# Patient Record
Sex: Female | Born: 1965
Health system: Southern US, Community
[De-identification: ages and names within clinical notes are randomized; demographics above are authoritative.]

## PROBLEM LIST (undated history)

## (undated) DIAGNOSIS — R42 Dizziness and giddiness: Secondary | ICD-10-CM

## (undated) DIAGNOSIS — R51 Headache: Secondary | ICD-10-CM

## (undated) DIAGNOSIS — K219 Gastro-esophageal reflux disease without esophagitis: Secondary | ICD-10-CM

## (undated) DIAGNOSIS — R519 Headache, unspecified: Secondary | ICD-10-CM

## (undated) DIAGNOSIS — A048 Other specified bacterial intestinal infections: Secondary | ICD-10-CM

## (undated) DIAGNOSIS — Z227 Latent tuberculosis: Secondary | ICD-10-CM

## (undated) DIAGNOSIS — I052 Rheumatic mitral stenosis with insufficiency: Secondary | ICD-10-CM

## (undated) DIAGNOSIS — R200 Anesthesia of skin: Secondary | ICD-10-CM

## (undated) DIAGNOSIS — I4891 Unspecified atrial fibrillation: Secondary | ICD-10-CM

## (undated) DIAGNOSIS — I10 Essential (primary) hypertension: Secondary | ICD-10-CM

## (undated) HISTORY — DX: Rheumatic mitral stenosis with insufficiency: I05.2

## (undated) HISTORY — DX: Other specified bacterial intestinal infections: A04.8

## (undated) HISTORY — DX: Essential (primary) hypertension: I10

## (undated) HISTORY — PX: DILATION AND CURETTAGE OF UTERUS: SHX78

## (undated) HISTORY — DX: Unspecified atrial fibrillation: I48.91

## (undated) HISTORY — DX: Latent tuberculosis: Z22.7

## (undated) HISTORY — DX: Gastro-esophageal reflux disease without esophagitis: K21.9

## (undated) HISTORY — DX: Anesthesia of skin: R20.0

---

## 2007-03-02 DIAGNOSIS — Z227 Latent tuberculosis: Secondary | ICD-10-CM | POA: Insufficient documentation

## 2007-03-02 HISTORY — DX: Latent tuberculosis: Z22.7

## 2009-11-27 ENCOUNTER — Ambulatory Visit: Payer: Self-pay | Admitting: Internal Medicine

## 2009-12-19 ENCOUNTER — Ambulatory Visit (HOSPITAL_COMMUNITY): Admission: RE | Admit: 2009-12-19 | Discharge: 2009-12-19 | Payer: Self-pay | Admitting: Family Medicine

## 2010-02-01 ENCOUNTER — Observation Stay (HOSPITAL_COMMUNITY)
Admission: EM | Admit: 2010-02-01 | Discharge: 2010-02-02 | Payer: Self-pay | Source: Home / Self Care | Admitting: Emergency Medicine

## 2010-05-12 LAB — POCT I-STAT, CHEM 8
Chloride: 106 mEq/L (ref 96–112)
Creatinine, Ser: 0.8 mg/dL (ref 0.4–1.2)
Hemoglobin: 13.9 g/dL (ref 12.0–15.0)
Potassium: 3.9 mEq/L (ref 3.5–5.1)
Sodium: 143 mEq/L (ref 135–145)

## 2011-10-15 ENCOUNTER — Other Ambulatory Visit: Payer: Self-pay | Admitting: Family Medicine

## 2011-10-15 ENCOUNTER — Ambulatory Visit (HOSPITAL_COMMUNITY)
Admission: RE | Admit: 2011-10-15 | Discharge: 2011-10-15 | Disposition: A | Discharge status: Home / Self Care | Payer: Self-pay | Source: Ambulatory Visit | Attending: Family Medicine | Admitting: Family Medicine

## 2011-10-15 DIAGNOSIS — R1084 Generalized abdominal pain: Secondary | ICD-10-CM

## 2011-10-15 DIAGNOSIS — R109 Unspecified abdominal pain: Secondary | ICD-10-CM | POA: Insufficient documentation

## 2012-02-09 ENCOUNTER — Inpatient Hospital Stay (HOSPITAL_COMMUNITY)
Admission: EM | Admit: 2012-02-09 | Discharge: 2012-02-15 | DRG: 149 | Disposition: A | Discharge status: Home / Self Care | Payer: MEDICAID | Attending: Internal Medicine | Admitting: Internal Medicine

## 2012-02-09 ENCOUNTER — Encounter (HOSPITAL_COMMUNITY): Payer: Self-pay | Admitting: *Deleted

## 2012-02-09 ENCOUNTER — Other Ambulatory Visit: Payer: Self-pay

## 2012-02-09 ENCOUNTER — Inpatient Hospital Stay (HOSPITAL_COMMUNITY): Payer: Self-pay

## 2012-02-09 ENCOUNTER — Emergency Department (HOSPITAL_COMMUNITY): Payer: Self-pay

## 2012-02-09 DIAGNOSIS — S92302A Fracture of unspecified metatarsal bone(s), left foot, initial encounter for closed fracture: Secondary | ICD-10-CM

## 2012-02-09 DIAGNOSIS — S92309A Fracture of unspecified metatarsal bone(s), unspecified foot, initial encounter for closed fracture: Secondary | ICD-10-CM | POA: Diagnosis present

## 2012-02-09 DIAGNOSIS — W06XXXA Fall from bed, initial encounter: Secondary | ICD-10-CM | POA: Diagnosis present

## 2012-02-09 DIAGNOSIS — S52123A Displaced fracture of head of unspecified radius, initial encounter for closed fracture: Secondary | ICD-10-CM | POA: Diagnosis present

## 2012-02-09 DIAGNOSIS — R4182 Altered mental status, unspecified: Secondary | ICD-10-CM

## 2012-02-09 DIAGNOSIS — R42 Dizziness and giddiness: Principal | ICD-10-CM | POA: Diagnosis present

## 2012-02-09 DIAGNOSIS — W19XXXA Unspecified fall, initial encounter: Secondary | ICD-10-CM

## 2012-02-09 DIAGNOSIS — R51 Headache: Secondary | ICD-10-CM | POA: Diagnosis present

## 2012-02-09 DIAGNOSIS — N390 Urinary tract infection, site not specified: Secondary | ICD-10-CM | POA: Diagnosis present

## 2012-02-09 DIAGNOSIS — Y998 Other external cause status: Secondary | ICD-10-CM

## 2012-02-09 DIAGNOSIS — R531 Weakness: Secondary | ICD-10-CM | POA: Diagnosis present

## 2012-02-09 DIAGNOSIS — S6290XA Unspecified fracture of unspecified wrist and hand, initial encounter for closed fracture: Secondary | ICD-10-CM

## 2012-02-09 DIAGNOSIS — Y92009 Unspecified place in unspecified non-institutional (private) residence as the place of occurrence of the external cause: Secondary | ICD-10-CM

## 2012-02-09 HISTORY — DX: Headache: R51

## 2012-02-09 HISTORY — DX: Dizziness and giddiness: R42

## 2012-02-09 HISTORY — DX: Headache, unspecified: R51.9

## 2012-02-09 LAB — BASIC METABOLIC PANEL
BUN: 11 mg/dL (ref 6–23)
CO2: 25 mEq/L (ref 19–32)
Calcium: 9.6 mg/dL (ref 8.4–10.5)
Chloride: 106 mEq/L (ref 96–112)
Creatinine, Ser: 0.69 mg/dL (ref 0.50–1.10)
GFR calc Af Amer: >90 mL/min (ref >90)
GFR calc non Af Amer: >90 mL/min (ref >90)
Glucose, Bld: 106 mg/dL — ABNORMAL HIGH (ref 70–99)
Potassium: 4 mEq/L (ref 3.5–5.1)
Sodium: 141 mEq/L (ref 135–145)

## 2012-02-09 LAB — URINE MICROSCOPIC-ADD ON

## 2012-02-09 LAB — CBC WITH DIFFERENTIAL/PLATELET
Basophils Absolute: 0 10*3/uL (ref 0.0–0.1)
Basophils Relative: 0 % (ref 0–1)
Eosinophils Absolute: 0.2 10*3/uL (ref 0.0–0.7)
Eosinophils Relative: 2 % (ref 0–5)
HCT: 39.4 % (ref 36.0–46.0)
Hemoglobin: 13.5 g/dL (ref 12.0–15.0)
Lymphocytes Relative: 35 % (ref 12–46)
Lymphs Abs: 3.1 10*3/uL (ref 0.7–4.0)
MCH: 29 pg (ref 26.0–34.0)
MCHC: 34.3 g/dL (ref 30.0–36.0)
MCV: 84.7 fL (ref 78.0–100.0)
Monocytes Absolute: 0.5 10*3/uL (ref 0.1–1.0)
Monocytes Relative: 6 % (ref 3–12)
Neutro Abs: 5 10*3/uL (ref 1.7–7.7)
Neutrophils Relative %: 57 % (ref 43–77)
Platelets: 213 10*3/uL (ref 150–400)
RBC: 4.65 MIL/uL (ref 3.87–5.11)
RDW: 12.5 % (ref 11.5–15.5)
WBC: 8.9 10*3/uL (ref 4.0–10.5)

## 2012-02-09 LAB — URINALYSIS, ROUTINE W REFLEX MICROSCOPIC
Bilirubin Urine: NEGATIVE
Glucose, UA: NEGATIVE mg/dL
Ketones, ur: NEGATIVE mg/dL
Nitrite: NEGATIVE
Protein, ur: NEGATIVE mg/dL
Specific Gravity, Urine: 1.027 (ref 1.005–1.030)
Urobilinogen, UA: 1 mg/dL (ref 0.0–1.0)
pH: 5.5 (ref 5.0–8.0)

## 2012-02-09 LAB — TROPONIN I: Troponin I: <0.3 ng/mL (ref <0.30)

## 2012-02-09 MED ORDER — KETOROLAC TROMETHAMINE 15 MG/ML IJ SOLN
15.0000 mg | Freq: Once | INTRAMUSCULAR | Status: AC
  Administered 2012-02-09: 15 mg via INTRAVENOUS
  Filled 2012-02-09 (×2): qty 1

## 2012-02-09 MED ORDER — SODIUM CHLORIDE 0.9 % IJ SOLN
3.0000 mL | Freq: Two times a day (BID) | INTRAMUSCULAR | Status: DC
  Administered 2012-02-10 – 2012-02-12 (×2): 3 mL via INTRAVENOUS

## 2012-02-09 MED ORDER — ONDANSETRON HCL 4 MG/2ML IJ SOLN
4.0000 mg | Freq: Four times a day (QID) | INTRAMUSCULAR | Status: DC | PRN
  Filled 2012-02-09: qty 2

## 2012-02-09 MED ORDER — ONDANSETRON HCL 4 MG PO TABS: 4.0000 mg | ORAL_TABLET | Freq: Four times a day (QID) | ORAL | Status: DC | PRN

## 2012-02-09 MED ORDER — SODIUM CHLORIDE 0.9 % IV SOLN: 250.0000 mL | INTRAVENOUS | Status: DC | PRN

## 2012-02-09 MED ORDER — ASPIRIN EC 81 MG PO TBEC
81.0000 mg | DELAYED_RELEASE_TABLET | Freq: Every day | ORAL | Status: DC
  Administered 2012-02-10 – 2012-02-15 (×6): 81 mg via ORAL
  Filled 2012-02-09 (×6): qty 1

## 2012-02-09 MED ORDER — SODIUM CHLORIDE 0.9 % IJ SOLN
3.0000 mL | INTRAMUSCULAR | Status: DC | PRN
  Administered 2012-02-10: 3 mL via INTRAVENOUS

## 2012-02-09 MED ORDER — SODIUM CHLORIDE 0.9 % IV SOLN
INTRAVENOUS | Status: DC
  Administered 2012-02-10 – 2012-02-11 (×2): via INTRAVENOUS

## 2012-02-09 MED ORDER — DEXTROSE 5 % IV SOLN
1.0000 g | Freq: Every day | INTRAVENOUS | Status: DC
  Administered 2012-02-10 (×2): 1 g via INTRAVENOUS
  Filled 2012-02-09 (×3): qty 10

## 2012-02-09 MED ORDER — SODIUM CHLORIDE 0.9 % IV BOLUS (SEPSIS)
1000.0000 mL | Freq: Once | INTRAVENOUS | Status: AC
  Administered 2012-02-09: 1000 mL via INTRAVENOUS

## 2012-02-09 MED ORDER — HEPARIN SODIUM (PORCINE) 5000 UNIT/ML IJ SOLN
5000.0000 [IU] | Freq: Three times a day (TID) | INTRAMUSCULAR | Status: DC
  Administered 2012-02-10 – 2012-02-15 (×15): 5000 [IU] via SUBCUTANEOUS
  Filled 2012-02-09 (×20): qty 1

## 2012-02-09 MED ORDER — MORPHINE SULFATE 4 MG/ML IJ SOLN
4.0000 mg | Freq: Once | INTRAMUSCULAR | Status: AC
  Administered 2012-02-09: 4 mg via INTRAVENOUS
  Filled 2012-02-09: qty 1

## 2012-02-09 MED ORDER — HYDROMORPHONE HCL PF 1 MG/ML IJ SOLN: 1.0000 mg | INTRAMUSCULAR | Status: DC | PRN

## 2012-02-09 MED ORDER — ONDANSETRON 4 MG PO TBDP
4.0000 mg | ORAL_TABLET | Freq: Once | ORAL | Status: AC
  Administered 2012-02-09: 4 mg via ORAL
  Filled 2012-02-09: qty 1

## 2012-02-09 MED ORDER — SODIUM CHLORIDE 0.9 % IJ SOLN: 3.0000 mL | Freq: Two times a day (BID) | INTRAMUSCULAR | Status: DC
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
## 2012-02-09 NOTE — ED Notes (Signed)
No answer for vital signs in waiting room.

## 2012-02-09 NOTE — ED Notes (Signed)
Pt complaining of left chest pain after fall

## 2012-02-09 NOTE — ED Notes (Signed)
Pt with lower abdominal heaviness for one month

## 2012-02-09 NOTE — ED Provider Notes (Signed)
History     CSN: [id number]  Arrival date & time 02/09/12  1300   First MD Initiated Contact with Patient 02/09/12 1608      Chief Complaint  Patient presents with  . Fall  . Dizziness  . Arm Injury  . Foot Pain    (Consider location/radiation/quality/duration/timing/severity/associated sxs/prior treatment) The history is provided by the patient. The history is limited by a language barrier. A language interpreter was used.   Abigail Butler 46 y.o. female     46 year old female presents to the emergency department with chief complaint of fall and pain.  History is limited by a language barrier and interpreter phone is used.The patient speaks a [place] dialect of arabic and translation has been difficult through certified language interpreters; although sudanese arabic interpreter was obtained. The patient has several episodes of dizziness and falling over the past 6 months.  She fell twice over the past 2 days.  It is unclear if her injuries occurred yesterday or today, however she has pain in her left foot and significant pain in her left elbow forearm and wrist.  The patient apparently fell while getting out of bed.  She states that she was lying down and when she stood up she felt very dizzy and weak and fell to the floor.  She denies vertiginous symptoms unilateral weakness visual disturbance, facial asymmetry or difficulty with speech; however the interpreter states that the patient seems to have difficulty understanding the questions and was unable to get a clear answer.The patient denies any significant comorbidities or chronic medical conditions.  She denies hitting her head or loss of consciousness.  She denies any recent nausea vomiting or diarrhea.  Patient states she has intermittent palpitations with chest pain.  It is unclear if this is associated with her falling episodes.   History reviewed. No pertinent past medical history.  History reviewed. No pertinent past  surgical history.  No family history on file.  History  Substance Use Topics  . Smoking status: Never Smoker   . Smokeless tobacco: Not on file  . Alcohol Use: No    OB History    Grav Para Term Preterm Abortions TAB SAB Ect Mult Living                  Review of Systems  Unable to perform ROS: Other    Allergies  Review of patient's allergies indicates no known allergies.  Home Medications  No current outpatient prescriptions on file.  BP 126/79  Pulse 82  Temp 98.5 F (36.9 C) (Oral)  Resp 18  SpO2 98%  Physical Exam  Nursing note and vitals reviewed. Constitutional: She is oriented to person, place, and time. She appears well-developed and well-nourished. She appears distressed.  HENT:  Head: Normocephalic and atraumatic.  Eyes: Conjunctivae normal and EOM are normal. Pupils are equal, round, and reactive to light.  Neck: Normal range of motion. Neck supple. No JVD present.  Cardiovascular: Normal rate, regular rhythm, normal heart sounds and intact distal pulses.  Exam reveals no friction rub.   No murmur heard. Pulmonary/Chest: Effort normal and breath sounds normal. No respiratory distress.  Abdominal: Soft. Bowel sounds are normal. She exhibits no distension.  Musculoskeletal:       Right elbow exquisitly tender to palpation.  There is swelling into ulnar side of the forearm there is swelling of wrist.  She is neurovascularly intact.  Patient also has swelling and deformity of the left foot. neurovascularly intact tender to palpation.  Neurological: She is alert and oriented to person, place, and time. No cranial nerve deficit.  Skin: Skin is warm and dry. No rash noted. She is not diaphoretic.    ED Course  Procedures (including critical care time)  Labs Reviewed  BASIC METABOLIC PANEL - Abnormal; Notable for the following:    Glucose, Bld 106 (*)     All other components within normal limits  URINALYSIS, ROUTINE W REFLEX MICROSCOPIC - Abnormal;  Notable for the following:    APPearance HAZY (*)     Hgb urine dipstick TRACE (*)     Leukocytes, UA MODERATE (*)     All other components within normal limits  URINE MICROSCOPIC-ADD ON - Abnormal; Notable for the following:    Squamous Epithelial / LPF MANY (*)     Bacteria, UA FEW (*)     All other components within normal limits  TROPONIN I  CBC WITH DIFFERENTIAL  URINE CULTURE   Dg Chest 2 View  02/09/2012  *RADIOLOGY REPORT*  Clinical Data: Chest pain radiating to posterior right chest, dizziness, fell 2 days ago  CHEST - 2 VIEW  Comparison: 02/01/2010  Findings: Upper-normal size of cardiac silhouette. Elongation thoracic aorta. Mediastinal contours and pulmonary vascularity otherwise normal. Minimal right base atelectasis. Lungs otherwise clear. No pleural effusion or pneumothorax. Bones unremarkable.  IMPRESSION: Minimal right basilar atelectasis   Original Report Authenticated By: Ulyses Southward, M.D.    Dg Ankle Complete Left  02/09/2012  *RADIOLOGY REPORT*  Clinical Data: Pain and swelling at lateral malleolus, fell 2 days ago injuring left ankle  LEFT ANKLE COMPLETE - 3+ VIEW  Comparison: None  Findings: Bones appear demineralized. Soft tissue swelling laterally extending anteriorly. Ankle mortise intact. Mildly distracted fracture identified at base of the fifth metatarsal. No additional fracture, dislocation or bone destruction. Accessory ossicle at lateral margin of cuboid.  IMPRESSION: Osseous demineralization. Distracted fracture at base of fifth metatarsal. Lateral ankle soft tissue swelling without additional acute bony abnormalities.   Original Report Authenticated By: Ulyses Southward, M.D.    Dg Humerus Left  02/09/2012  *RADIOLOGY REPORT*  Clinical Data: Left humeral pain post fall  LEFT HUMERUS - 2+ VIEW  Comparison: None  Findings: Single lateral view of the left humerus with additional scapular Y view of the proximal left humerus and shoulder. Patient unable to position for AP  humerus image.  Osseous demineralization. No gross fracture, dislocation or bone destruction identified on limited exam.  IMPRESSION: No definite acute bony abnormalities on limited exam as above.   Original Report Authenticated By: Ulyses Southward, M.D.     Date: 02/10/2012  Rate: 82  Rhythm: normal sinus rhythm  QRS Axis: normal  Intervals: normal  ST/T Wave abnormalities: nonspecific t wave abnormalities  Conduction Disutrbances: none  Narrative Interpretation:   Old EKG Reviewed: n/a    1. Fall at home   2. Dizziness   3. Fracture of metatarsal bone of left foot   4. Radial head fracture       MDM   Filed Vitals:   02/09/12 1311 02/09/12 1605  BP: 134/69 126/79  Pulse: 88 82  Temp: 98.5 F (36.9 C)   TempSrc: Oral   Resp: 20 18  SpO2: 98% 98%   Patient wit dizziness and repeated falls significant enough to lead to multiple fractures. Social issues, lack of health care access  and financial barriers make in-patient work-up essential. The patient has no acute ecg abnormalities and neuro exam is otherwise normal although mental  status US difficult to assess due to language barriers. I have spoke with Dr. HO who agrees to admit the patient for further assessment.        Arthor Captain, PA-C 02/10/12 1208

## 2012-02-09 NOTE — ED Notes (Signed)
Patient transported to X-ray 

## 2012-02-09 NOTE — Progress Notes (Signed)
Orthopedic Tech Progress Note Patient Details:  Abigail Butler [birth year] [id number]  Ortho Devices Type of Ortho Device: Ace wrap;Arm sling;Post (short) splint;Sugartong splint Splint Material: Fiberglass Ortho Device/Splint Location: (L) U-L E Ortho Device/Splint Interventions: Application   Jennye Moccasin 02/09/2012, 8:24 PM

## 2012-02-09 NOTE — ED Notes (Signed)
Ortho tech made aware of need for splints

## 2012-02-09 NOTE — ED Notes (Signed)
PT here with dizziness and fell two days ago and hurt left foot.  Then patient got dizzy again and fell this am and complaining of pain to left upper distal arm near elbow ( can move fingers but not bend)

## 2012-02-10 ENCOUNTER — Inpatient Hospital Stay (HOSPITAL_COMMUNITY): Payer: MEDICAID

## 2012-02-10 ENCOUNTER — Encounter (HOSPITAL_COMMUNITY): Payer: Self-pay | Admitting: Internal Medicine

## 2012-02-10 DIAGNOSIS — R5383 Other fatigue: Secondary | ICD-10-CM

## 2012-02-10 DIAGNOSIS — R0989 Other specified symptoms and signs involving the circulatory and respiratory systems: Secondary | ICD-10-CM

## 2012-02-10 DIAGNOSIS — S5290XA Unspecified fracture of unspecified forearm, initial encounter for closed fracture: Secondary | ICD-10-CM

## 2012-02-10 DIAGNOSIS — Y92009 Unspecified place in unspecified non-institutional (private) residence as the place of occurrence of the external cause: Secondary | ICD-10-CM

## 2012-02-10 DIAGNOSIS — R4182 Altered mental status, unspecified: Secondary | ICD-10-CM

## 2012-02-10 DIAGNOSIS — R42 Dizziness and giddiness: Secondary | ICD-10-CM | POA: Diagnosis present

## 2012-02-10 DIAGNOSIS — S6290XA Unspecified fracture of unspecified wrist and hand, initial encounter for closed fracture: Secondary | ICD-10-CM | POA: Diagnosis present

## 2012-02-10 DIAGNOSIS — S62309A Unspecified fracture of unspecified metacarpal bone, initial encounter for closed fracture: Secondary | ICD-10-CM

## 2012-02-10 DIAGNOSIS — R531 Weakness: Secondary | ICD-10-CM | POA: Diagnosis present

## 2012-02-10 LAB — LIPID PANEL
HDL: 28 mg/dL — ABNORMAL LOW (ref >39)
LDL Cholesterol: 95 mg/dL (ref 0–99)
Total CHOL/HDL Ratio: 5.3 RATIO
Triglycerides: 118 mg/dL (ref <150)

## 2012-02-10 LAB — HEMOGLOBIN A1C: Hgb A1c MFr Bld: 6 % — ABNORMAL HIGH (ref <5.7)

## 2012-02-10 LAB — URINE CULTURE
Colony Count: NO GROWTH
Culture: NO GROWTH

## 2012-02-10 LAB — FOLATE: Folate: 16.8 ng/mL

## 2012-02-10 LAB — VITAMIN B12: Vitamin B-12: 596 pg/mL (ref 211–911)

## 2012-02-10 MED ORDER — HYDROMORPHONE HCL PF 1 MG/ML IJ SOLN
0.5000 mg | INTRAMUSCULAR | Status: DC | PRN
  Administered 2012-02-10: 0.5 mg via INTRAVENOUS
  Filled 2012-02-10 (×2): qty 1

## 2012-02-10 NOTE — H&P (Signed)
IM Attending  59 woman from [place], here 3-4 years but no [facility].  Ptresents with recurring spells of dizzyness with serious falling over 7 years (beginning in [place]).  Pattern is not clearly worsening but is persistent.  This week she has fallen hard twice and has fractures of radius and a metatarsal!  The spells have sense of head motion.  No LOC or post-ictal deficits.  No known hx of head injury, infection or epilepsy.  Often has severe headache during spells. CT w/o contrast negative.  Labs unhelpful:  bmet, cbc, troponins normal.  EKG with T-wave flattening infero-lateral.  Impression:  This is a serious falling disorder with multiple fractures.  Chronicity makes infection or neoplasm unlikely.  Atypical seizures, migraine, inner ear disease,  or (less likely) TIAs possible.  Language and financial barriers make in-patient work-up essential.  I would begin with a Neuro consult so that we do not perform unnecessary or inadequate studies.
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
## 2012-02-10 NOTE — ED Provider Notes (Signed)
Medical screening examination/treatment/procedure(s) were performed by non-physician practitioner and as supervising physician I was immediately available for consultation/collaboration.  Raeford Razor, MD 02/10/12 1452

## 2012-02-10 NOTE — Progress Notes (Signed)
VASCULAR LAB PRELIMINARY  PRELIMINARY  PRELIMINARY  PRELIMINARY  Carotid Dopplers completed.    Preliminary report:  There is no  ICA stenosis.  Vertebral artery flow is antegrade.  Abigail Butler, RVT 02/10/2012, 11:01 AM

## 2012-02-11 ENCOUNTER — Inpatient Hospital Stay (HOSPITAL_COMMUNITY): Payer: Self-pay

## 2012-02-11 DIAGNOSIS — R4182 Altered mental status, unspecified: Secondary | ICD-10-CM | POA: Diagnosis present

## 2012-02-11 LAB — URINALYSIS, ROUTINE W REFLEX MICROSCOPIC
Bilirubin Urine: NEGATIVE
Glucose, UA: NEGATIVE mg/dL
Hgb urine dipstick: NEGATIVE
Ketones, ur: NEGATIVE mg/dL
Protein, ur: NEGATIVE mg/dL
pH: 6 (ref 5.0–8.0)

## 2012-02-11 LAB — CBC
MCH: 27.9 pg (ref 26.0–34.0)
MCV: 84.8 fL (ref 78.0–100.0)
Platelets: 204 10*3/uL (ref 150–400)
RDW: 12.6 % (ref 11.5–15.5)
WBC: 8.1 10*3/uL (ref 4.0–10.5)

## 2012-02-11 LAB — BASIC METABOLIC PANEL
CO2: 24 mEq/L (ref 19–32)
Calcium: 8.6 mg/dL (ref 8.4–10.5)
Creatinine, Ser: 0.7 mg/dL (ref 0.50–1.10)
GFR calc non Af Amer: >90 mL/min (ref >90)
Glucose, Bld: 105 mg/dL — ABNORMAL HIGH (ref 70–99)
Sodium: 140 mEq/L (ref 135–145)

## 2012-02-11 LAB — CK TOTAL AND CKMB (NOT AT ARMC): Total CK: 85 U/L (ref 7–177)

## 2012-02-11 MED ORDER — IBUPROFEN 400 MG PO TABS
400.0000 mg | ORAL_TABLET | Freq: Four times a day (QID) | ORAL | Status: DC | PRN
  Administered 2012-02-11 – 2012-02-12 (×2): 400 mg via ORAL
  Filled 2012-02-11 (×3): qty 1

## 2012-02-11 MED ORDER — ACETAMINOPHEN 325 MG PO TABS
650.0000 mg | ORAL_TABLET | ORAL | Status: DC | PRN
  Administered 2012-02-11 – 2012-02-14 (×3): 650 mg via ORAL
  Filled 2012-02-11 (×3): qty 2

## 2012-02-11 MED ORDER — CIPROFLOXACIN HCL 250 MG PO TABS
250.0000 mg | ORAL_TABLET | Freq: Two times a day (BID) | ORAL | Status: AC
  Administered 2012-02-11 – 2012-02-12 (×3): 250 mg via ORAL
  Filled 2012-02-11 (×3): qty 1

## 2012-02-11 NOTE — Progress Notes (Signed)
EEG completed.

## 2012-02-12 ENCOUNTER — Inpatient Hospital Stay (HOSPITAL_COMMUNITY): Payer: MEDICAID

## 2012-02-12 LAB — BASIC METABOLIC PANEL
BUN: 10 mg/dL (ref 6–23)
Chloride: 106 mEq/L (ref 96–112)
Glucose, Bld: 111 mg/dL — ABNORMAL HIGH (ref 70–99)
Potassium: 4 mEq/L (ref 3.5–5.1)

## 2012-02-12 LAB — CBC
HCT: 37.4 % (ref 36.0–46.0)
Hemoglobin: 12.4 g/dL (ref 12.0–15.0)
MCH: 28.1 pg (ref 26.0–34.0)
MCHC: 33.2 g/dL (ref 30.0–36.0)
MCV: 84.6 fL (ref 78.0–100.0)

## 2012-02-12 MED ORDER — IOHEXOL 350 MG/ML SOLN
50.0000 mL | Freq: Once | INTRAVENOUS | Status: AC | PRN
  Administered 2012-02-12: 50 mL via INTRAVENOUS

## 2012-02-12 NOTE — Procedures (Signed)
EEG NUMBER:  [id number]  REFERRING PHYSICIAN:  Noel Christmas, MD  INDICATION FOR STUDY:  A 46 year old lady with a history of dizziness and unsteady gait as well as episodes of transient mental status changes.  Study is being performed to rule out possible seizure disorder.  This is a routine EEG recording performed during wakefulness. Predominant background activity consisted of low amplitude 2-3 Hz diffuse symmetrical low-amplitude delta activity with superimposed 15-20 Hz beta activity diffusely.  Occasional brief runs of 8 Hz alpha rhythm were recorded from the posterior head regions.  Photic stimulation was not performed.  Hyperventilation was also not performed.  No epileptiform discharges were recorded.  There were no areas of disproportionate focal slowing.  INTERPRETATION:  This EEG showed minimal generalized continuous slowing of cerebral activity, which was nonspecific.  This pattern of slowing can be seen with metabolic as well as degenerative hypoxic encephalopathies.  No evidence of an epileptic disorder was demonstrated.     Noel Christmas, MD    [id number] D:  02/11/2012 12:29:51  T:  02/12/2012 00:20:01  Job #:  [id number]

## 2012-02-12 NOTE — Progress Notes (Signed)
Orthopedic Tech Progress Note Patient Details:  Abigail Butler 10/16/1965 [id number]  Ortho Devices Type of Ortho Device: Postop shoe/boot Splint Material: Fiberglass Ortho Device/Splint Location: LEFT POST OP SHOE  Ortho Device/Splint Interventions: Application   [name], [name] 02/12/2012, 3:52 PM
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
## 2012-02-12 NOTE — Progress Notes (Signed)
Subjective: Pt no acute events overnight. Still with some suprapubic discomfort. No dizziness at this time.   Objective: Vital signs in last 24 hours: Filed Vitals:   02/11/12 1100 02/11/12 1435 02/11/12 2142 02/12/12 0630  BP:  116/67 141/84 115/46  Pulse:  69 70 72  Temp:  98.4 F (36.9 C) 96.8 F (36 C) 98.4 F (36.9 C)  TempSrc:  Oral Oral Oral  Resp:  18 18 18   Height: 5\' 4"  (1.626 m)     Weight: 180 lb (81.647 kg)     SpO2:  95% 95% 96%   Weight change:   Intake/Output Summary (Last 24 hours) at 02/12/12 0844 Last data filed at 02/11/12 1639  Gross per 24 hour  Intake    600 ml  Output    500 ml  Net    100 ml   General: resting in bed, NAD HEENT: PERRL, EOMI, no scleral icterus Cardiac: RRR, no rubs, murmurs or gallops Pulm: clear to auscultation bilaterally, moving normal volumes of air Abd: soft, nontender, nondistended, BS present Ext: warm and well perfused, no pedal edema Neuro: alert and oriented X3, L arm and L leg in casts, able to move all limbs spontaneously with equal strength 5/5, cranial nerves II-XII grossly intact  Lab Results: Basic Metabolic Panel:  Lab 02/12/12 4540 02/11/12 0600  NA 140 140  K 4.0 3.7  CL 106 107  CO2 24 24  GLUCOSE 111* 105*  BUN 10 9  CREATININE 0.71 0.70  CALCIUM 9.0 8.6  MG -- --  PHOS -- --   CBC:  Lab 02/12/12 0622 02/11/12 0600 02/09/12 1336  WBC 7.4 8.1 --  NEUTROABS -- -- 5.0  HGB 12.4 11.9* --  HCT 37.4 36.2 --  MCV 84.6 84.8 --  PLT 203 204 --   Urinalysis:  Lab 02/11/12 1250 02/09/12 1616  COLORURINE YELLOW YELLOW  LABSPEC 1.011 1.027  PHURINE 6.0 5.5  GLUCOSEU NEGATIVE NEGATIVE  HGBUR NEGATIVE TRACE*  BILIRUBINUR NEGATIVE NEGATIVE  KETONESUR NEGATIVE NEGATIVE  PROTEINUR NEGATIVE NEGATIVE  UROBILINOGEN 0.2 1.0  NITRITE NEGATIVE NEGATIVE  LEUKOCYTESUR NEGATIVE MODERATE*   Micro Results: Recent Results (from the past 240 hour(s))  URINE CULTURE     Status: Normal   Collection Time    02/09/12  4:16 PM      Component Value Range Status Comment   Specimen Description URINE, CLEAN CATCH   Final    Special Requests NONE   Final    Culture  Setup Time 02/09/2012 16:52   Final    Colony Count NO GROWTH   Final    Culture NO GROWTH   Final    Report Status 02/10/2012 FINAL   Final    Studies/Results: Mr [facility] Head Wo Contrast  02/11/2012  *RADIOLOGY REPORT*  Clinical Data:  5-year history of intermittent headaches and vertigo.  Fall.  Episodes of spells of staring.  MRI BRAIN WITHOUT CONTRAST MRA HEAD WITHOUT CONTRAST  Technique: Multiplanar, multiecho pulse sequences of the brain and surrounding structures were obtained according to standard protocol without intravenous contrast.  Angiographic images of the head were obtained using MRA technique without contrast.  Comparison: 02/09/2012 head CT.  No comparison brain MR.  MRI HEAD  Findings:  No acute infarct.  Remote right thalamic infarct.  No intracranial hemorrhage.  No intracranial mass lesion detected on this unenhanced exam.  No hydrocephalus.  Cerebellar tonsils minimally low-lying but within range of normal limits without a pointed appearance.  Partially empty sella.  This may be an incidental finding although also described in patients with pseudotumor cerebri.  No perioptic sheath dilation or flattening of the posterior aspect of the globes as can be seen with pseudotumor.  IMPRESSION: Remote right thalamic infarct.  Please see above.  MRA HEAD  Findings: Anterior circulation without medium or large size vessel significant stenosis or occlusion.  Nonvisualization PICAs.  No significant stenosis of the basilar artery.  Mild branch vessel irregularity.  On source sequences (series 7 images 76 and 77), minimal bulge right internal carotid artery cavernous segment.  This may be related to artifact although a tiny aneurysm is not entirely excluded.  IMPRESSION:  Nonvisualization PICAs.  Mild branch vessel irregularity.  On source  sequences (series 7 images 76 and 77), minimal bulge right internal carotid artery cavernous segment.  This may be related to artifact although a tiny aneurysm is not entirely excluded.   Original Report Authenticated By: Lacy Duverney, M.D.    Mr Brain Wo Contrast  02/11/2012  *RADIOLOGY REPORT*  Clinical Data:  5-year history of intermittent headaches and vertigo.  Fall.  Episodes of spells of staring.  MRI BRAIN WITHOUT CONTRAST MRA HEAD WITHOUT CONTRAST  Technique: Multiplanar, multiecho pulse sequences of the brain and surrounding structures were obtained according to standard protocol without intravenous contrast.  Angiographic images of the head were obtained using MRA technique without contrast.  Comparison: 02/09/2012 head CT.  No comparison brain MR.  MRI HEAD  Findings:  No acute infarct.  Remote right thalamic infarct.  No intracranial hemorrhage.  No intracranial mass lesion detected on this unenhanced exam.  No hydrocephalus.  Cerebellar tonsils minimally low-lying but within range of normal limits without a pointed appearance.  Partially empty sella.  This may be an incidental finding although also described in patients with pseudotumor cerebri.  No perioptic sheath dilation or flattening of the posterior aspect of the globes as can be seen with pseudotumor.  IMPRESSION: Remote right thalamic infarct.  Please see above.  MRA HEAD  Findings: Anterior circulation without medium or large size vessel significant stenosis or occlusion.  Nonvisualization PICAs.  No significant stenosis of the basilar artery.  Mild branch vessel irregularity.  On source sequences (series 7 images 76 and 77), minimal bulge right internal carotid artery cavernous segment.  This may be related to artifact although a tiny aneurysm is not entirely excluded.  IMPRESSION:  Nonvisualization PICAs.  Mild branch vessel irregularity.  On source sequences (series 7 images 76 and 77), minimal bulge right internal carotid artery  cavernous segment.  This may be related to artifact although a tiny aneurysm is not entirely excluded.   Original Report Authenticated By: Lacy Duverney, M.D.    Medications: I have reviewed the patient's current medications. Scheduled Meds:    . aspirin EC  81 mg Oral Daily  . ciprofloxacin  250 mg Oral BID  . heparin  5,000 Units Subcutaneous Q8H  . sodium chloride  3 mL Intravenous Q12H  . sodium chloride  3 mL Intravenous Q12H   Continuous Infusions:  PRN Meds:.sodium chloride, acetaminophen, ibuprofen, ondansetron (ZOFRAN) IV, ondansetron, sodium chloride Assessment/Plan: Ms. Arango 46 yo Arabic speaking [place] refugee immigrated to [place] for past 4-5 yrs presented after significant fall that resulted in fractured Left radius and left metatarsal 2/2 vertigo.  1. UTI: Pt symptomatic pyuria and suprapubic tenderness on admission. Now improving. originally started on IV ceftriaxone. UCX NGTD. -cont oral cipro  2. Vertigo: Chronic spells of vertigo in both lying and  sitting positions for the past 6 yrs. CT was negative for intracranial pathology or acute hemorrhage. EKG with T-wave inversion in inferior-lateral leads but w/o corresponding negative troponins. DDx include vestibular neuritis, Meniere's disease, or BPPV but given severity of symptoms maybe something more serious. 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 EEG showed diffuse mild slowing cerebral activity non-specific but no active seizure disorder. -neurology consult- recs much appreciated  3. Fractures: Plain films reveal left impacted fracture of the proximal radial metaphysis and distracted fracture at base of fifth metatarsal with lateral ankle soft tissue swelling. Orthopedics was consulted and she now has left arm and left foot sling/splint. She is non-weight bearing in the left foot. She was instructed to keep her sling and ACE bandages dry and clean  until she is seen by the Orthopedic surgeon.  -pt will need to be seen by ortho surgery for recs/ follow-up - d/c pt complained dilaudid made dizziness worse -pain tylenol and ibuprofen prn  -consider adding ultram    Dispo: Disposition is deferred at this time, awaiting improvement of current medical problems.  Anticipated discharge in approximately 1-2 day(s).   The patient does not have a current PCP (DEFAULT,PROVIDER, MD), therefore will be requiring OPC follow-up after discharge.   The patient does not have transportation limitations that hinder transportation to clinic appointments.  .Services Needed at time of discharge: Y = Yes, Blank = No PT:   OT:   RN:   Equipment:   Other:     LOS: 3 days   [name] 02/12/2012, 8:44 AM [phone]

## 2012-02-12 NOTE — Progress Notes (Signed)
Subjective: Other complaints. Patient is not experiencing any dizziness at this point. Should headache earlier which improved with analgesic medication.  Objective: Current vital signs: BP 115/46  Pulse 72  Temp 98.4 F (36.9 C) (Oral)  Resp 18  Ht 5\' 4"  (1.626 m)  Wt 81.647 kg (180 lb)  BMI 30.90 kg/m2  SpO2 96%  Neurologic Exam: Alert and in no acute distress. Able to communicate well verbally.  Studies/Results: Mr Shirlee Latch WU Contrast  02/11/2012  *RADIOLOGY REPORT*  Clinical Data:  5-year history of intermittent headaches and vertigo.  Fall.  Episodes of spells of staring.  MRI BRAIN WITHOUT CONTRAST MRA HEAD WITHOUT CONTRAST  Technique: Multiplanar, multiecho pulse sequences of the brain and surrounding structures were obtained according to standard protocol without intravenous contrast.  Angiographic images of the head were obtained using MRA technique without contrast.  Comparison: 02/09/2012 head CT.  No comparison brain MR.  MRI HEAD  Findings:  No acute infarct.  Remote right thalamic infarct.  No intracranial hemorrhage.  No intracranial mass lesion detected on this unenhanced exam.  No hydrocephalus.  Cerebellar tonsils minimally low-lying but within range of normal limits without a pointed appearance.  Partially empty sella.  This may be an incidental finding although also described in patients with pseudotumor cerebri.  No perioptic sheath dilation or flattening of the posterior aspect of the globes as can be seen with pseudotumor.  IMPRESSION: Remote right thalamic infarct.  Please see above.  MRA HEAD  Findings: Anterior circulation without medium or large size vessel significant stenosis or occlusion.  Nonvisualization PICAs.  No significant stenosis of the basilar artery.  Mild branch vessel irregularity.  On source sequences (series 7 images 76 and 77), minimal bulge right internal carotid artery cavernous segment.  This may be related to artifact although a tiny aneurysm is  not entirely excluded.  IMPRESSION:  Nonvisualization PICAs.  Mild branch vessel irregularity.  On source sequences (series 7 images 76 and 77), minimal bulge right internal carotid artery cavernous segment.  This may be related to artifact although a tiny aneurysm is not entirely excluded.   Original Report Authenticated By: Lacy Duverney, M.D.    Mr Brain Wo Contrast  02/11/2012  *RADIOLOGY REPORT*  Clinical Data:  5-year history of intermittent headaches and vertigo.  Fall.  Episodes of spells of staring.  MRI BRAIN WITHOUT CONTRAST MRA HEAD WITHOUT CONTRAST  Technique: Multiplanar, multiecho pulse sequences of the brain and surrounding structures were obtained according to standard protocol without intravenous contrast.  Angiographic images of the head were obtained using MRA technique without contrast.  Comparison: 02/09/2012 head CT.  No comparison brain MR.  MRI HEAD  Findings:  No acute infarct.  Remote right thalamic infarct.  No intracranial hemorrhage.  No intracranial mass lesion detected on this unenhanced exam.  No hydrocephalus.  Cerebellar tonsils minimally low-lying but within range of normal limits without a pointed appearance.  Partially empty sella.  This may be an incidental finding although also described in patients with pseudotumor cerebri.  No perioptic sheath dilation or flattening of the posterior aspect of the globes as can be seen with pseudotumor.  IMPRESSION: Remote right thalamic infarct.  Please see above.  MRA HEAD  Findings: Anterior circulation without medium or large size vessel significant stenosis or occlusion.  Nonvisualization PICAs.  No significant stenosis of the basilar artery.  Mild branch vessel irregularity.  On source sequences (series 7 images 76 and 77), minimal bulge right internal carotid artery cavernous  segment.  This may be related to artifact although a tiny aneurysm is not entirely excluded.  IMPRESSION:  Nonvisualization PICAs.  Mild branch vessel  irregularity.  On source sequences (series 7 images 76 and 77), minimal bulge right internal carotid artery cavernous segment.  This may be related to artifact although a tiny aneurysm is not entirely excluded.   Original Report Authenticated By: Lacy Duverney, M.D.     Medications:  Scheduled:   . aspirin EC  81 mg Oral Daily  . ciprofloxacin  250 mg Oral BID  . heparin  5,000 Units Subcutaneous Q8H  . sodium chloride  3 mL Intravenous Q12H  . sodium chloride  3 mL Intravenous Q12H   ZOX:WRUEAV chloride, acetaminophen, ibuprofen, ondansetron (ZOFRAN) IV, ondansetron, sodium chloride  Assessment/Plan: Recurrent episodes of dizziness of unclear etiology. MRI showed no indication of acute stroke. 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 EEG showed mild slowing of cerebral activity but no signs of seizure disorder.  Recommending CT angiogram of head and neck further cerebrovascular evaluation. No changes in current management recommended otherwise.   C.R. Roseanne Reno, MD [facility]  02/12/2012  9:39 AM

## 2012-02-13 DIAGNOSIS — R51 Headache: Secondary | ICD-10-CM

## 2012-02-13 LAB — BASIC METABOLIC PANEL
BUN: 12 mg/dL (ref 6–23)
Calcium: 9.3 mg/dL (ref 8.4–10.5)
GFR calc non Af Amer: >90 mL/min (ref >90)
Glucose, Bld: 110 mg/dL — ABNORMAL HIGH (ref 70–99)

## 2012-02-13 LAB — CBC
MCH: 28 pg (ref 26.0–34.0)
MCHC: 33.1 g/dL (ref 30.0–36.0)
Platelets: 204 10*3/uL (ref 150–400)

## 2012-02-13 NOTE — Progress Notes (Signed)
Subjective: Patient notes 1 sensation of headache overnight, but no dizziness.  Patient seen by Ortho yesterday, casts removed, sling placed.  Patient still notes some pain in her left arm and leg.   Objective: Vital signs in last 24 hours: Filed Vitals:   02/12/12 1503 02/12/12 2125 02/13/12 0616 02/13/12 0640  BP: 135/58 107/47 98/58 116/50  Pulse: 88 79 68   Temp: 99 F (37.2 C) 98.1 F (36.7 C) 98.3 F (36.8 C)   TempSrc: Oral Oral Oral   Resp: 20 19 19    Height:      Weight:      SpO2: 96% 98% 96%    Weight change:   Intake/Output Summary (Last 24 hours) at 02/13/12 1333 Last data filed at 02/13/12 1330  Gross per 24 hour  Intake    360 ml  Output    500 ml  Net   -140 ml   General: resting in bed, NAD HEENT: PERRL, EOMI, no scleral icterus Cardiac: RRR, no rubs, murmurs or gallops Pulm: clear to auscultation bilaterally, moving normal volumes of air Abd: soft, nontender, nondistended, BS present Ext: warm and well perfused, no pedal edema Neuro: alert and oriented X3, L arm and L leg in casts, able to move all limbs spontaneously with equal strength 5/5, cranial nerves II-XII grossly intact  Lab Results: Basic Metabolic Panel:  Lab 02/13/12 1308 02/12/12 0622  NA 139 140  K 4.1 4.0  CL 103 106  CO2 23 24  GLUCOSE 110* 111*  BUN 12 10  CREATININE 0.69 0.71  CALCIUM 9.3 9.0  MG -- --  PHOS -- --   CBC:  Lab 02/13/12 0655 02/12/12 0622 02/09/12 1336  WBC 9.0 7.4 --  NEUTROABS -- -- 5.0  HGB 13.4 12.4 --  HCT 40.5 37.4 --  MCV 84.6 84.6 --  PLT 204 203 --   Urinalysis:  Lab 02/11/12 1250 02/09/12 1616  COLORURINE YELLOW YELLOW  LABSPEC 1.011 1.027  PHURINE 6.0 5.5  GLUCOSEU NEGATIVE NEGATIVE  HGBUR NEGATIVE TRACE*  BILIRUBINUR NEGATIVE NEGATIVE  KETONESUR NEGATIVE NEGATIVE  PROTEINUR NEGATIVE NEGATIVE  UROBILINOGEN 0.2 1.0  NITRITE NEGATIVE NEGATIVE  LEUKOCYTESUR NEGATIVE MODERATE*   Micro Results: Recent Results (from the past 240  hour(s))  URINE CULTURE     Status: Normal   Collection Time   02/09/12  4:16 PM      Component Value Range Status Comment   Specimen Description URINE, CLEAN CATCH   Final    Special Requests NONE   Final    Culture  Setup Time 02/09/2012 16:52   Final    Colony Count NO GROWTH   Final    Culture NO GROWTH   Final    Report Status 02/10/2012 FINAL   Final    Studies/Results: Ct Angio Head W/cm &/or Wo Cm  02/12/2012  *RADIOLOGY REPORT*  Clinical Data:  Dizziness.  Equivocal MRA abnormalities.  CT ANGIOGRAPHY HEAD AND NECK  Technique:  Multidetector CT imaging of the head and neck was performed using the standard protocol during bolus administration of intravenous contrast.  Multiplanar CT image reconstructions including MIPs were obtained to evaluate the vascular anatomy. Carotid stenosis measurements (when applicable) are obtained utilizing NASCET criteria, using the distal internal carotid diameter as the denominator.  Contrast: 50mL OMNIPAQUE IOHEXOL 350 MG/ML SOLN  Comparison:   MRI and MRA 02/10/2012.  CTA NECK  Findings:  There is a bovine type arch pattern with the left carotid originating from the innominate.  No  proximal stenosis is seen.  There is no ostial narrowing.  No neck masses, pneumothorax, or lung apex lesions are seen.  No stenosis or irregularity of the carotid bifurcations.  No cervical ICA dissection or fibromuscular disease.  Both vertebrals are patent through the neck.  There is no significant cervical spondylosis.   Review of the MIP images confirms the above findings.  IMPRESSION: Unremarkable CTA of the extracranial circulation.  CTA HEAD  Findings:  There is no evidence for acute infarction, intracranial hemorrhage, mass lesion, hydrocephalus, or extra-axial fluid. There is no atrophy or white matter disease.  Post infusion, there is no abnormal intracranial enhancement.  The calvarium is intact.  No evidence for vertebrobasilar disease.  Patency of both posterior  inferior cerebellar arteries is demonstrated without visible disease.  No evidence for internal carotid artery berry aneurysm. No significant cavernous outpouching.  No intracranial stenosis or berry aneurysm. No abnormalities of the anterior, middle, or posterior cerebral arteries.   Review of the MIP images confirms the above findings.  IMPRESSION: Unremarkable CT angiography of the intracranial circulation.   Original Report Authenticated By: Davonna Belling, M.D.    Ct Angio Neck W/cm &/or Wo/cm  02/12/2012  *RADIOLOGY REPORT*  Clinical Data:  Dizziness.  Equivocal MRA abnormalities.  CT ANGIOGRAPHY HEAD AND NECK  Technique:  Multidetector CT imaging of the head and neck was performed using the standard protocol during bolus administration of intravenous contrast.  Multiplanar CT image reconstructions including MIPs were obtained to evaluate the vascular anatomy. Carotid stenosis measurements (when applicable) are obtained utilizing NASCET criteria, using the distal internal carotid diameter as the denominator.  Contrast: 50mL OMNIPAQUE IOHEXOL 350 MG/ML SOLN  Comparison:   MRI and MRA 02/10/2012.  CTA NECK  Findings:  There is a bovine type arch pattern with the left carotid originating from the innominate.  No proximal stenosis is seen.  There is no ostial narrowing.  No neck masses, pneumothorax, or lung apex lesions are seen.  No stenosis or irregularity of the carotid bifurcations.  No cervical ICA dissection or fibromuscular disease.  Both vertebrals are patent through the neck.  There is no significant cervical spondylosis.   Review of the MIP images confirms the above findings.  IMPRESSION: Unremarkable CTA of the extracranial circulation.  CTA HEAD  Findings:  There is no evidence for acute infarction, intracranial hemorrhage, mass lesion, hydrocephalus, or extra-axial fluid. There is no atrophy or white matter disease.  Post infusion, there is no abnormal intracranial enhancement.  The calvarium is  intact.  No evidence for vertebrobasilar disease.  Patency of both posterior inferior cerebellar arteries is demonstrated without visible disease.  No evidence for internal carotid artery berry aneurysm. No significant cavernous outpouching.  No intracranial stenosis or berry aneurysm. No abnormalities of the anterior, middle, or posterior cerebral arteries.   Review of the MIP images confirms the above findings.  IMPRESSION: Unremarkable CT angiography of the intracranial circulation.   Original Report Authenticated By: Davonna Belling, M.D.    Medications: I have reviewed the patient's current medications. Scheduled Meds:    . aspirin EC  81 mg Oral Daily  . heparin  5,000 Units Subcutaneous Q8H  . sodium chloride  3 mL Intravenous Q12H  . sodium chloride  3 mL Intravenous Q12H   Continuous Infusions:  PRN Meds:.sodium chloride, acetaminophen, ibuprofen, ondansetron (ZOFRAN) IV, ondansetron, sodium chloride Assessment/Plan: Ms. Jacot 46 yo Arabic speaking [place] refugee immigrated to [place] for past 4-5 yrs presented after significant fall that resulted  in fractured Left radius and left metatarsal 2/2 vertigo.  1. UTI: Pt symptomatic pyuria and suprapubic tenderness on admission. Now improving. originally started on IV ceftriaxone. UCX NGTD. -patient completed 3-day course of abx (Ceftriaxone 12/12-12/13, cipro 12/13-12/14)  2. Vertigo: Chronic spells of vertigo in both lying and sitting positions for the past 6 yrs. CT was negative for intracranial pathology or acute hemorrhage. EKG with T-wave inversion in inferior-lateral leads but w/o corresponding negative troponins. DDx include vestibular neuritis, Meniere's disease, or BPPV but given severity of symptoms maybe something more serious. 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 EEG showed diffuse mild slowing cerebral activity non-specific but no active seizure  disorder. -neurology consult- recs much appreciated  3. Fractures: Plain films reveal left impacted fracture of the proximal radial metaphysis and distracted fracture at base of fifth metatarsal with lateral ankle soft tissue swelling. Orthopedics was consulted and she now has left arm sling and left foot boot. She can weight bear on left foot. -f/u ortho outpatient - d/c pt complained dilaudid made dizziness worse -pain tylenol and ibuprofen prn   Dispo: Disposition is deferred at this time, awaiting improvement of current medical problems.  Anticipated discharge in approximately 1-2 day(s).   The patient does not have a current PCP (DEFAULT,PROVIDER, MD), therefore will be requiring OPC follow-up after discharge.   The patient does not have transportation limitations that hinder transportation to clinic appointments.  .Services Needed at time of discharge: Y = Yes, Blank = No PT:   OT:   RN:   Equipment:   Other:     LOS: 4 days   Janalyn Harder 02/13/2012, 1:33 PM [phone]

## 2012-02-13 NOTE — Progress Notes (Signed)
Subjective: No new complaints. No recurrence of vertigo.  Objective: Current vital signs: BP 116/50  Pulse 68  Temp 98.3 F (36.8 C) (Oral)  Resp 19  Ht 5\' 4"  (1.626 m)  Wt 81.647 kg (180 lb)  BMI 30.90 kg/m2  SpO2 96%  Neurologic Exam: Alert and in no acute distress. Mental status was normal.  Lab Results: Results for orders placed during the hospital encounter of 02/09/12 (from the past 48 hour(s))  URINALYSIS, ROUTINE W REFLEX MICROSCOPIC     Status: Normal   Collection Time   02/11/12 12:50 PM      Component Value Range Comment   Color, Urine YELLOW  YELLOW    APPearance CLEAR  CLEAR    Specific Gravity, Urine 1.011  1.005 - 1.030    pH 6.0  5.0 - 8.0    Glucose, UA NEGATIVE  NEGATIVE mg/dL    Hgb urine dipstick NEGATIVE  NEGATIVE    Bilirubin Urine NEGATIVE  NEGATIVE    Ketones, ur NEGATIVE  NEGATIVE mg/dL    Protein, ur NEGATIVE  NEGATIVE mg/dL    Urobilinogen, UA 0.2  0.0 - 1.0 mg/dL    Nitrite NEGATIVE  NEGATIVE    Leukocytes, UA NEGATIVE  NEGATIVE MICROSCOPIC NOT DONE ON URINES WITH NEGATIVE PROTEIN, BLOOD, LEUKOCYTES, NITRITE, OR GLUCOSE <1000 mg/dL.  BASIC METABOLIC PANEL     Status: Abnormal   Collection Time   02/12/12  6:22 AM      Component Value Range Comment   Sodium 140  135 - 145 mEq/L    Potassium 4.0  3.5 - 5.1 mEq/L    Chloride 106  96 - 112 mEq/L    CO2 24  19 - 32 mEq/L    Glucose, Bld 111 (*) 70 - 99 mg/dL    BUN 10  6 - 23 mg/dL    Creatinine, Ser 0.98  0.50 - 1.10 mg/dL    Calcium 9.0  8.4 - 11.9 mg/dL    GFR calc non Af Amer >90  >90 mL/min    GFR calc Af Amer >90  >90 mL/min   CBC     Status: Normal   Collection Time   02/12/12  6:22 AM      Component Value Range Comment   WBC 7.4  4.0 - 10.5 K/uL    RBC 4.42  3.87 - 5.11 MIL/uL    Hemoglobin 12.4  12.0 - 15.0 g/dL    HCT 14.7  82.9 - 56.2 %    MCV 84.6  78.0 - 100.0 fL    MCH 28.1  26.0 - 34.0 pg    MCHC 33.2  30.0 - 36.0 g/dL    RDW 13.0  86.5 - 78.4 %    Platelets 203  150  - 400 K/uL   BASIC METABOLIC PANEL     Status: Abnormal   Collection Time   02/13/12  6:55 AM      Component Value Range Comment   Sodium 139  135 - 145 mEq/L    Potassium 4.1  3.5 - 5.1 mEq/L    Chloride 103  96 - 112 mEq/L    CO2 23  19 - 32 mEq/L    Glucose, Bld 110 (*) 70 - 99 mg/dL    BUN 12  6 - 23 mg/dL    Creatinine, Ser 6.96  0.50 - 1.10 mg/dL    Calcium 9.3  8.4 - 29.5 mg/dL    GFR calc non Af Amer >90  >90  mL/min    GFR calc Af Amer >90  >90 mL/min   CBC     Status: Normal   Collection Time   02/13/12  6:55 AM      Component Value Range Comment   WBC 9.0  4.0 - 10.5 K/uL    RBC 4.79  3.87 - 5.11 MIL/uL    Hemoglobin 13.4  12.0 - 15.0 g/dL    HCT 47.8  29.5 - 62.1 %    MCV 84.6  78.0 - 100.0 fL    MCH 28.0  26.0 - 34.0 pg    MCHC 33.1  30.0 - 36.0 g/dL    RDW 30.8  65.7 - 84.6 %    Platelets 204  150 - 400 K/uL     Studies/Results: Ct Angio Head W/cm &/or Wo Cm  02/12/2012  *RADIOLOGY REPORT*  Clinical Data:  Dizziness.  Equivocal MRA abnormalities.  CT ANGIOGRAPHY HEAD AND NECK  Technique:  Multidetector CT imaging of the head and neck was performed using the standard protocol during bolus administration of intravenous contrast.  Multiplanar CT image reconstructions including MIPs were obtained to evaluate the vascular anatomy. Carotid stenosis measurements (when applicable) are obtained utilizing NASCET criteria, using the distal internal carotid diameter as the denominator.  Contrast: 50mL OMNIPAQUE IOHEXOL 350 MG/ML SOLN  Comparison:   MRI and MRA 02/10/2012.  CTA NECK  Findings:  There is a bovine type arch pattern with the left carotid originating from the innominate.  No proximal stenosis is seen.  There is no ostial narrowing.  No neck masses, pneumothorax, or lung apex lesions are seen.  No stenosis or irregularity of the carotid bifurcations.  No cervical ICA dissection or fibromuscular disease.  Both vertebrals are patent through the neck.  There is no  significant cervical spondylosis.   Review of the MIP images confirms the above findings.  IMPRESSION: Unremarkable CTA of the extracranial circulation.  CTA HEAD  Findings:  There is no evidence for acute infarction, intracranial hemorrhage, mass lesion, hydrocephalus, or extra-axial fluid. There is no atrophy or white matter disease.  Post infusion, there is no abnormal intracranial enhancement.  The calvarium is intact.  No evidence for vertebrobasilar disease.  Patency of both posterior inferior cerebellar arteries is demonstrated without visible disease.  No evidence for internal carotid artery berry aneurysm. No significant cavernous outpouching.  No intracranial stenosis or berry aneurysm. No abnormalities of the anterior, middle, or posterior cerebral arteries.   Review of the MIP images confirms the above findings.  IMPRESSION: Unremarkable CT angiography of the intracranial circulation.   Original Report Authenticated By: Davonna Belling, M.D.    Ct Angio Neck W/cm &/or Wo/cm  02/12/2012  *RADIOLOGY REPORT*  Clinical Data:  Dizziness.  Equivocal MRA abnormalities.  CT ANGIOGRAPHY HEAD AND NECK  Technique:  Multidetector CT imaging of the head and neck was performed using the standard protocol during bolus administration of intravenous contrast.  Multiplanar CT image reconstructions including MIPs were obtained to evaluate the vascular anatomy. Carotid stenosis measurements (when applicable) are obtained utilizing NASCET criteria, using the distal internal carotid diameter as the denominator.  Contrast: 50mL OMNIPAQUE IOHEXOL 350 MG/ML SOLN  Comparison:   MRI and MRA 02/10/2012.  CTA NECK  Findings:  There is a bovine type arch pattern with the left carotid originating from the innominate.  No proximal stenosis is seen.  There is no ostial narrowing.  No neck masses, pneumothorax, or lung apex lesions are seen.  No stenosis or irregularity  of the carotid bifurcations.  No cervical ICA dissection or  fibromuscular disease.  Both vertebrals are patent through the neck.  There is no significant cervical spondylosis.   Review of the MIP images confirms the above findings.  IMPRESSION: Unremarkable CTA of the extracranial circulation.  CTA HEAD  Findings:  There is no evidence for acute infarction, intracranial hemorrhage, mass lesion, hydrocephalus, or extra-axial fluid. There is no atrophy or white matter disease.  Post infusion, there is no abnormal intracranial enhancement.  The calvarium is intact.  No evidence for vertebrobasilar disease.  Patency of both posterior inferior cerebellar arteries is demonstrated without visible disease.  No evidence for internal carotid artery berry aneurysm. No significant cavernous outpouching.  No intracranial stenosis or berry aneurysm. No abnormalities of the anterior, middle, or posterior cerebral arteries.   Review of the MIP images confirms the above findings.  IMPRESSION: Unremarkable CT angiography of the intracranial circulation.   Original Report Authenticated By: Davonna Belling, M.D.     Medications:  Scheduled:   . aspirin EC  81 mg Oral Daily  . heparin  5,000 Units Subcutaneous Q8H  . sodium chloride  3 mL Intravenous Q12H  . 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 MRI showed no signs of acute stroke involving vertebrobasilar vascular territory. MRA showed suggestion of right ICA aneurysm and nonvisualization of PICAs. Subsequent CT angiogram of the head and neck showed no signs of ICA aneurysm, and normal appearance of PICAs. Patient is on Lipitor for hyperlipidemia. Hemoglobin A1c was 6.0. Vitamin B12 and folate levels were normal.  No further neurological intervention is indicated at this point. I will sign off on her care, but remain available for  reevaluation if indicated.   C.R. Roseanne Reno, MD [facility] [phone]  02/13/2012  9:16 AM

## 2012-02-14 LAB — BASIC METABOLIC PANEL
Calcium: 9.7 mg/dL (ref 8.4–10.5)
Creatinine, Ser: 0.68 mg/dL (ref 0.50–1.10)
GFR calc non Af Amer: >90 mL/min (ref >90)
Glucose, Bld: 110 mg/dL — ABNORMAL HIGH (ref 70–99)
Sodium: 138 mEq/L (ref 135–145)

## 2012-02-14 LAB — CBC
MCH: 28.3 pg (ref 26.0–34.0)
Platelets: 219 10*3/uL (ref 150–400)
RBC: 4.7 MIL/uL (ref 3.87–5.11)
RDW: 12.6 % (ref 11.5–15.5)
WBC: 9.1 10*3/uL (ref 4.0–10.5)
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
## 2012-02-15 LAB — CBC
MCV: 84.9 fL (ref 78.0–100.0)
Platelets: 218 10*3/uL (ref 150–400)
RBC: 4.69 MIL/uL (ref 3.87–5.11)
WBC: 8.7 10*3/uL (ref 4.0–10.5)

## 2012-02-15 LAB — BASIC METABOLIC PANEL
CO2: 24 mEq/L (ref 19–32)
Calcium: 9.5 mg/dL (ref 8.4–10.5)
GFR calc Af Amer: >90 mL/min (ref >90)
GFR calc non Af Amer: >90 mL/min (ref >90)
Sodium: 137 mEq/L (ref 135–145)

## 2012-02-15 MED ORDER — IBUPROFEN 400 MG PO TABS: 400.0000 mg | ORAL_TABLET | Freq: Four times a day (QID) | ORAL | Status: DC | PRN

## 2012-02-15 MED ORDER — MECLIZINE HCL 12.5 MG PO TABS: 12.5000 mg | ORAL_TABLET | Freq: Three times a day (TID) | ORAL | Status: DC | PRN

## 2012-02-15 MED ORDER — ASPIRIN 81 MG PO TBEC: 81.0000 mg | DELAYED_RELEASE_TABLET | Freq: Every day | ORAL | Status: DC

## 2012-02-15 NOTE — Progress Notes (Signed)
Patient discharged to home in care of daughters. Medications and instructions reviewed with patient and daughters by RN and MD who speaks patient's native language with all questions answered.  Patient requested walker for assistance at home. [facility] bringing walker to room. Assessment unchanged from this am. Patient is to follow up on 1.2.13 with Outpatient Clinic and with Dr. Magnus Ivan in 2 weeks.

## 2012-02-15 NOTE — Discharge Summary (Signed)
Agree 
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
## 2012-03-02 ENCOUNTER — Ambulatory Visit (INDEPENDENT_AMBULATORY_CARE_PROVIDER_SITE_OTHER): Payer: Self-pay | Admitting: Internal Medicine

## 2012-03-02 ENCOUNTER — Ambulatory Visit: Payer: Self-pay

## 2012-03-02 ENCOUNTER — Encounter: Payer: Self-pay | Admitting: Internal Medicine

## 2012-03-02 VITALS — BP 125/53 | HR 97 | Temp 97.1°F | Ht 65.0 in | Wt 193.0 lb

## 2012-03-02 DIAGNOSIS — Z0000 Encounter for general adult medical examination without abnormal findings: Secondary | ICD-10-CM

## 2012-03-02 DIAGNOSIS — R42 Dizziness and giddiness: Secondary | ICD-10-CM

## 2012-03-02 DIAGNOSIS — J069 Acute upper respiratory infection, unspecified: Secondary | ICD-10-CM

## 2012-03-02 MED ORDER — LORATADINE 10 MG PO TABS: 10.0000 mg | ORAL_TABLET | Freq: Every day | ORAL | Status: DC
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
## 2012-03-03 ENCOUNTER — Ambulatory Visit: Payer: Self-pay

## 2012-03-03 LAB — URINALYSIS, ROUTINE W REFLEX MICROSCOPIC
Glucose, UA: NEGATIVE mg/dL
Hgb urine dipstick: NEGATIVE
Nitrite: NEGATIVE
Protein, ur: NEGATIVE mg/dL
Urobilinogen, UA: 0.2 mg/dL (ref 0.0–1.0)
pH: 5.5 (ref 5.0–8.0)

## 2012-03-31 ENCOUNTER — Encounter (HOSPITAL_COMMUNITY): Payer: Self-pay | Admitting: *Deleted

## 2012-03-31 ENCOUNTER — Telehealth: Payer: Self-pay | Admitting: *Deleted

## 2012-03-31 ENCOUNTER — Ambulatory Visit: Payer: Self-pay

## 2012-03-31 ENCOUNTER — Emergency Department (HOSPITAL_COMMUNITY)
Admission: EM | Admit: 2012-03-31 | Discharge: 2012-03-31 | Disposition: A | Discharge status: Home / Self Care | Payer: Self-pay | Attending: Emergency Medicine | Admitting: Emergency Medicine

## 2012-03-31 ENCOUNTER — Emergency Department (HOSPITAL_COMMUNITY): Payer: Self-pay

## 2012-03-31 DIAGNOSIS — M25519 Pain in unspecified shoulder: Secondary | ICD-10-CM | POA: Insufficient documentation

## 2012-03-31 DIAGNOSIS — R079 Chest pain, unspecified: Secondary | ICD-10-CM | POA: Insufficient documentation

## 2012-03-31 LAB — CBC
Hemoglobin: 12.6 g/dL (ref 12.0–15.0)
MCHC: 33.7 g/dL (ref 30.0–36.0)
Platelets: 214 10*3/uL (ref 150–400)
RBC: 4.44 MIL/uL (ref 3.87–5.11)

## 2012-03-31 LAB — COMPREHENSIVE METABOLIC PANEL
ALT: 16 U/L (ref 0–35)
AST: 17 U/L (ref 0–37)
Alkaline Phosphatase: 89 U/L (ref 39–117)
CO2: 26 mEq/L (ref 19–32)
GFR calc Af Amer: >90 mL/min (ref >90)
Glucose, Bld: 88 mg/dL (ref 70–99)
Potassium: 3.6 mEq/L (ref 3.5–5.1)
Sodium: 139 mEq/L (ref 135–145)
Total Protein: 7.9 g/dL (ref 6.0–8.3)

## 2012-03-31 MED ORDER — OXYCODONE-ACETAMINOPHEN 5-325 MG PO TABS: 1.0000 | ORAL_TABLET | Freq: Four times a day (QID) | ORAL | Status: DC | PRN

## 2012-03-31 MED ORDER — OXYCODONE-ACETAMINOPHEN 5-325 MG PO TABS
1.0000 | ORAL_TABLET | Freq: Once | ORAL | Status: AC
  Administered 2012-03-31: 1 via ORAL
  Filled 2012-03-31: qty 1

## 2012-03-31 NOTE — ED Notes (Signed)
Patient transported to X-ray 

## 2012-03-31 NOTE — ED Provider Notes (Signed)
History     CSN: [id number]  Arrival date & time 03/31/12  1355   First MD Initiated Contact with Patient 03/31/12 1451      Chief Complaint  Patient presents with  . Arm Pain  . Chest Pain    (Consider location/radiation/quality/duration/timing/severity/associated sxs/prior treatment) HPI A LEVEL 5 CAVEAT PERTAINS DUE TO LANGUAGE BARRIER Pt presenting with c/o left shoulder pain.  She states she fell in December 2013 and since then has been having pain in left shoulder- worse with movement and palpation.  Pain radiates down into her left anterior chest wall.  No nausea, diaphoresis.  No sob.  She states she has been taking tylenol which does not help much with the pain.  There are no other associated systemic symptoms, there are no other alleviating or modifying factors.   History reviewed. No pertinent past medical history.  History reviewed. No pertinent past surgical history.  No family history on file.  History  Substance Use Topics  . Smoking status: Never Smoker   . Smokeless tobacco: Not on file  . Alcohol Use: No    OB History    Grav Para Term Preterm Abortions TAB SAB Ect Mult Living                  Review of Systems UNABLE TO OBTAIN ROS DUE TO LEVEL 5 CAVEAT  Allergies  Review of patient's allergies indicates no known allergies.  Home Medications   Current Outpatient Rx  Name  Route  Sig  Dispense  Refill  . ACETAMINOPHEN 500 MG PO TABS   Oral   Take 1,000 mg by mouth every 6 (six) hours as needed. For pain         . PRESCRIPTION MEDICATION   Oral   Take 1 tablet by mouth daily. Patient not sure of what the medication is or what it is for.         . OXYCODONE-ACETAMINOPHEN 5-325 MG PO TABS   Oral   Take 1-2 tablets by mouth every 6 (six) hours as needed for pain.   15 tablet   0     BP 118/67  Pulse 88  Temp 98.4 F (36.9 C) (Oral)  Resp 18  SpO2 95% Vitals reviewed Physical Exam Physical Examination: General appearance -  alert, well appearing, and in no distress Mental status - alert, oriented to person, place, and time Eyes - pupils equal and reactive, extraocular eye movements intact Mouth - mucous membranes moist, pharynx normal without lesions Chest - clear to auscultation, no wheezes, rales or rhonchi, symmetric air entry Heart - normal rate, regular rhythm, normal S1, S2, no murmurs, rubs, clicks or gallops Abdomen - soft, nontender, nondistended, no masses or organomegaly Musculoskeletal - no joint tenderness, deformity or swelling Extremities - peripheral pulses normal, no pedal edema, no clubbing or cyanosis Skin - normal coloration and turgor, no rashes, no suspicious skin lesions noted  ED Course  Procedures (including critical care time)   Date: 03/31/2012  Rate: 81  Rhythm: normal sinus rhythm  QRS Axis: normal  Intervals: normal  ST/T Wave abnormalities: normal  Conduction Disutrbances: none  Narrative Interpretation: unremarkable        Labs Reviewed  CBC  COMPREHENSIVE METABOLIC PANEL  POCT I-STAT TROPONIN I   Dg Chest 2 View  03/31/2012  *RADIOLOGY REPORT*  Clinical Data: Left-sided chest pain and left arm pain.  Cough.  CHEST - 2 VIEW  Comparison: None.  Findings: The heart size and vascularity  are normal and the lungs are clear.  No osseous abnormality.  IMPRESSION: Normal chest.   Original Report Authenticated By: Francene Boyers, M.D.    Dg Shoulder Left  03/31/2012  *RADIOLOGY REPORT*  Clinical Data: Fall, chest pain, left shoulder pain.  LEFT SHOULDER - 2+ VIEW  Comparison: None  Findings: Degenerative changes in the left AC and glenohumeral joints. No acute bony abnormality.  Specifically, no fracture, subluxation, or dislocation.  Soft tissues are intact.  Visualized left lung is clear.  IMPRESSION: Degenerative changes. No acute bony abnormality.   Original Report Authenticated By: Charlett Nose, M.D.      1. Shoulder pain       MDM  Pt presenting with pain in  left shoulder that has been present since fall in December.  She is wearing a sling on arrival.  Pain radiates from shoulder to chest, I have a low suspicion for ACS or other causes of chest pain.  Suspect rotator cuff injury.  Pt advised to wear sling for comfort.  Given pain medication and orthopedic information for followup.  Discharged with strict return precautions.  Pt agreeable with plan.        Ethelda Chick, MD 03/31/12 1728

## 2012-03-31 NOTE — ED Notes (Addendum)
Pt c/o L arm pain/upper back pain and L chest pain x 1 month (wearing sling on L arm).  However, today the chest pain because the pain became worse.  PT c/o dizziness at present.  Pt only speaks Arabic.  Translator going home.

## 2012-03-31 NOTE — Telephone Encounter (Signed)
Agree with ER.  Thanks.  

## 2012-03-31 NOTE — Telephone Encounter (Signed)
Pt walked into clinic  With several complaints. Fall on 12/2 with hospitalization for 1 week, 1 month of headache, vision changes and soreness to body. Today c/o Chest pain and left arm pain with nausea and sweating. Pt assessed in triage by MD and sent to ED for evaluation of chest pain.

## 2012-04-03 ENCOUNTER — Encounter: Payer: Self-pay | Admitting: Internal Medicine

## 2012-06-06 ENCOUNTER — Encounter: Payer: Self-pay | Admitting: Internal Medicine

## 2012-06-06 DIAGNOSIS — R269 Unspecified abnormalities of gait and mobility: Secondary | ICD-10-CM | POA: Insufficient documentation

## 2012-06-09 ENCOUNTER — Ambulatory Visit: Payer: Self-pay

## 2012-06-15 ENCOUNTER — Ambulatory Visit: Payer: Self-pay

## 2012-06-19 ENCOUNTER — Ambulatory Visit: Payer: Self-pay

## 2012-11-10 ENCOUNTER — Encounter: Payer: No Typology Code available for payment source | Admitting: Internal Medicine

## 2012-11-10 ENCOUNTER — Encounter: Payer: Self-pay | Admitting: Internal Medicine

## 2012-12-25 ENCOUNTER — Encounter: Payer: Self-pay | Admitting: Internal Medicine

## 2012-12-27 ENCOUNTER — Ambulatory Visit: Payer: Self-pay | Admitting: Internal Medicine

## 2013-01-16 ENCOUNTER — Ambulatory Visit (INDEPENDENT_AMBULATORY_CARE_PROVIDER_SITE_OTHER): Payer: Self-pay | Admitting: Internal Medicine

## 2013-01-16 ENCOUNTER — Encounter: Payer: Self-pay | Admitting: Internal Medicine

## 2013-01-16 VITALS — BP 116/70 | HR 73 | Temp 96.9°F | Ht 65.0 in | Wt 179.5 lb

## 2013-01-16 DIAGNOSIS — R51 Headache: Secondary | ICD-10-CM

## 2013-01-16 DIAGNOSIS — R519 Headache, unspecified: Secondary | ICD-10-CM | POA: Insufficient documentation

## 2013-01-16 NOTE — Progress Notes (Signed)
I saw and evaluated the patient.  I personally confirmed the key portions of the history and exam documented by Dr. Chikowski and I reviewed pertinent patient test results.  The assessment, diagnosis, and plan were formulated together and I agree with the documentation in the resident's note. 

## 2013-01-16 NOTE — Assessment & Plan Note (Signed)
I believe that patient's HA and reported continued intermittent dizziness represent persistent symptoms from her prior thalamic infarct. Though it is impossible to discern whether or not there is a new intracranial process without further imaging, I believe that patients symptoms today represent chronic issues that she has continued to struggle with for some time now. I think the main issue in patient's health care is lack of trust and miscommunication 2/2 language and cultural barriers. I think she would benefit from close follow up with her PCP, Dr. Burtis Butler, who per daughter, patient is fond of. Patient has scheduled follow up with Dr. Burtis Butler on December 5th.   Given patient's HA improves with Tylenol and she does not have any red flag signs or symptoms, I think she can continue this therapy for now. Patient had been taking Tylenol OTC twice daily. I instructed her that she is able to increase the frequency of Tylenol up to three times per day if she finds increased relief of her symptoms. Migraine or tension HAs are also possible, though the time course of her HAs is not typical for either. I think ultimately patient would benefit from physical therapy for her balance as well as for her back pain (did not order today as I think PCP would be best to discuss this with patient). We did not particularly address the back pain as when asked what her main problems were, she mentioned only her HA and her L arm pain.  Patient was told she could use NSAIDs such as ibuprofen for her back pain. If her back pain does not resolve, she may benefit from imaging, though I would first manage conservatively for 4-6 weeks.

## 2013-01-16 NOTE — Patient Instructions (Signed)
Thank you for your visit.  Please follow up with Dr. Burtis Junes as previously scheduled on December 5th.   You can continue taking Tylenol for your headache until this appointment. You can take two tablets up to three times per day.
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
## 2013-02-02 ENCOUNTER — Encounter: Payer: Self-pay | Admitting: Internal Medicine

## 2013-02-12 ENCOUNTER — Ambulatory Visit: Payer: Self-pay

## 2013-02-12 ENCOUNTER — Ambulatory Visit: Payer: Self-pay | Admitting: Internal Medicine

## 2013-02-14 ENCOUNTER — Ambulatory Visit: Payer: Self-pay | Admitting: Internal Medicine

## 2013-02-19 ENCOUNTER — Encounter: Payer: Self-pay | Admitting: Internal Medicine

## 2013-02-19 ENCOUNTER — Ambulatory Visit: Payer: Self-pay | Admitting: Internal Medicine

## 2013-05-28 ENCOUNTER — Ambulatory Visit: Payer: Self-pay | Admitting: Internal Medicine

## 2013-05-29 ENCOUNTER — Ambulatory Visit: Payer: Self-pay | Admitting: Internal Medicine

## 2013-05-31 ENCOUNTER — Ambulatory Visit (INDEPENDENT_AMBULATORY_CARE_PROVIDER_SITE_OTHER): Payer: Self-pay | Admitting: Internal Medicine

## 2013-05-31 ENCOUNTER — Encounter: Payer: Self-pay | Admitting: Internal Medicine

## 2013-05-31 ENCOUNTER — Ambulatory Visit: Payer: Self-pay

## 2013-05-31 VITALS — BP 121/78 | HR 79 | Temp 97.0°F | Ht 65.0 in | Wt 183.0 lb

## 2013-05-31 DIAGNOSIS — K219 Gastro-esophageal reflux disease without esophagitis: Secondary | ICD-10-CM

## 2013-05-31 DIAGNOSIS — G8929 Other chronic pain: Secondary | ICD-10-CM

## 2013-05-31 DIAGNOSIS — I639 Cerebral infarction, unspecified: Secondary | ICD-10-CM

## 2013-05-31 DIAGNOSIS — M549 Dorsalgia, unspecified: Secondary | ICD-10-CM

## 2013-05-31 MED ORDER — NAPROXEN SODIUM 220 MG PO CAPS: 220.0000 mg | ORAL_CAPSULE | Freq: Two times a day (BID) | ORAL | Status: DC

## 2013-05-31 MED ORDER — KETOROLAC TROMETHAMINE 30 MG/ML IJ SOLN
30.0000 mg | Freq: Once | INTRAMUSCULAR | Status: AC
  Administered 2013-05-31: 30 mg via INTRAMUSCULAR

## 2013-05-31 MED ORDER — RANITIDINE HCL 150 MG PO CAPS: 150.0000 mg | ORAL_CAPSULE | Freq: Two times a day (BID) | ORAL | Status: DC

## 2013-05-31 MED ORDER — ACETAMINOPHEN 500 MG PO TABS: 1000.0000 mg | ORAL_TABLET | Freq: Four times a day (QID) | ORAL | Status: DC | PRN

## 2013-05-31 NOTE — Patient Instructions (Signed)
-  You may take 1-2 tablets of Tylenol 500mg  4 times per day (maximum dose of Tylenol in 24 hours is 4 g or 8 tablets) -Stop taking Motrin/Advil/Ibuprofen -Start taking Aleve (naprosen) 220mg  twice per day -You may apply a heating pad to your lower back 4 times per day for 20 minutes at a time -Start taking Zantac (ranitidine) 150mg  twice per day to help your stomach burning sensation.  -Follow up with [facility] for the [employer]. Let us know once it is approved so I can refer you to Physical Therapy -Follow the instructions below for back exercises.  -Make an appointment to see Dr. Burtis JunesSadek this month or next month.  -It was great meeting you!       Back Exercises Back exercises help treat and prevent back injuries. The goal is to increase your strength in your belly (abdominal) and back muscles. These exercises can also help with flexibility. Start these exercises when told by your doctor.  HOME CARE Back exercises include: Pelvic Tilt.   Lie on your back with your knees bent. Tilt your pelvis until the lower part of your back is against the floor. Hold this position 5 to 10 sec. Repeat this exercise 5 to 10 times. Knee to Chest.  Pull 1 knee up against your chest and hold for 20 to 30 seconds. Repeat this with the other knee. This may be done with the other leg straight or bent, whichever feels better. Then, pull both knees up against your chest. Sit-Ups or Curl-Ups.  Bend your knees 90 degrees. Start with tilting your pelvis, and do a partial, slow sit-up. Only lift your upper half 30 to 45 degrees off the floor. Take at least 2 to 3 seonds for each sit-up. Do not do sit-ups with your knees out straight. If partial sit-ups are difficult, simply do the above but with only tightening your belly (abdominal) muscles and holding it as told. Hip-Lift.  Lie on your back with your knees flexed 90 degrees. Push down with your feet and shoulders as you raise your hips 2 inches off the  floor. Hold for 10 seconds, repeat 5 to 10 times. Back Arches.  Lie on your stomach. Prop yourself up on bent elbows. Slowly press on your hands, causing an arch in your low back. Repeat 3 to 5 times. Shoulder-Lifts.  Lie face down with arms beside your body. Keep hips and belly pressed to floor as you slowly lift your head and shoulders off the floor. Do not overdo your exercises. Be careful in the beginning. Exercises may cause you some mild back discomfort. If the pain lasts for more than 15 minutes, stop the exercises until you see your doctor. Improvement with exercise for back problems is slow.  Document Released: 03/20/2010 Document Revised: 05/10/2011 Document Reviewed: 12/17/2010 [employer] Patient Information 2014 [employer] [place]  to help with the pain and to help strengthen your back muscles

## 2013-06-01 DIAGNOSIS — Z8673 Personal history of transient ischemic attack (TIA), and cerebral infarction without residual deficits: Secondary | ICD-10-CM

## 2013-06-01 DIAGNOSIS — K219 Gastro-esophageal reflux disease without esophagitis: Secondary | ICD-10-CM | POA: Insufficient documentation

## 2013-06-01 DIAGNOSIS — M5416 Radiculopathy, lumbar region: Secondary | ICD-10-CM | POA: Insufficient documentation

## 2013-06-01 HISTORY — DX: Personal history of transient ischemic attack (TIA), and cerebral infarction without residual deficits: Z86.73
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
## 2013-06-01 NOTE — Progress Notes (Signed)
Case discussed with Dr. Kennerly soon after the resident saw the patient.  We reviewed the resident's history and exam and pertinent patient test results.  I agree with the assessment, diagnosis, and plan of care documented in the resident's note. 
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
## 2013-06-01 NOTE — Assessment & Plan Note (Addendum)
She has had burning sensation in her stomach recently as she continues to take NSAIDs for her chronic back pain and ASA for secondary stroke prevention. Reports no emesis, melena, or hematochezia.  -Rx Zantac 150mg  BID which is on the $4 [facility] list

## 2013-06-15 ENCOUNTER — Ambulatory Visit: Payer: Self-pay

## 2013-06-21 ENCOUNTER — Ambulatory Visit: Payer: No Typology Code available for payment source

## 2013-06-29 ENCOUNTER — Ambulatory Visit (INDEPENDENT_AMBULATORY_CARE_PROVIDER_SITE_OTHER): Payer: No Typology Code available for payment source | Admitting: Internal Medicine

## 2013-06-29 ENCOUNTER — Encounter: Payer: Self-pay | Admitting: Internal Medicine

## 2013-06-29 VITALS — BP 118/82 | HR 80 | Temp 98.3°F | Ht 65.5 in | Wt 185.2 lb

## 2013-06-29 DIAGNOSIS — G8929 Other chronic pain: Secondary | ICD-10-CM

## 2013-06-29 DIAGNOSIS — Z658 Other specified problems related to psychosocial circumstances: Secondary | ICD-10-CM

## 2013-06-29 DIAGNOSIS — M5416 Radiculopathy, lumbar region: Secondary | ICD-10-CM

## 2013-06-29 DIAGNOSIS — R42 Dizziness and giddiness: Secondary | ICD-10-CM

## 2013-06-29 DIAGNOSIS — M549 Dorsalgia, unspecified: Secondary | ICD-10-CM

## 2013-06-29 DIAGNOSIS — R51 Headache: Secondary | ICD-10-CM

## 2013-06-29 DIAGNOSIS — IMO0002 Reserved for concepts with insufficient information to code with codable children: Secondary | ICD-10-CM

## 2013-06-29 MED ORDER — SUMATRIPTAN SUCCINATE 50 MG PO TABS: 50.0000 mg | ORAL_TABLET | Freq: Once | ORAL | Status: DC | PRN

## 2013-06-29 MED ORDER — CYCLOBENZAPRINE HCL 5 MG PO TABS: 5.0000 mg | ORAL_TABLET | Freq: Three times a day (TID) | ORAL | Status: AC | PRN

## 2013-06-29 MED ORDER — TRIAMTERENE-HCTZ 37.5-25 MG PO TABS: 1.0000 | ORAL_TABLET | Freq: Every day | ORAL | Status: DC

## 2013-06-29 MED ORDER — KETOROLAC TROMETHAMINE 30 MG/ML IJ SOLN
30.0000 mg | Freq: Once | INTRAMUSCULAR | Status: AC
  Administered 2013-06-29: 30 mg via INTRAMUSCULAR
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
## 2013-06-29 NOTE — Patient Instructions (Signed)
Please bring your medicines with you each time you come.   Medicines may be  Eye drops  Herbal   Vitamins  Pills  Seeing these help us take care of you.  For the headaches:  -try the imitrex when you are having the dizzy spells and sensitivity to light and sound  For the dizziness:  -try the triamterence- hctz medicine once a day everyday  For the back pain:  -flexeril for the spasms/pain -physical therapy  We will see you back in 2 weeks to check labs and see how you are feeling

## 2013-06-30 NOTE — Assessment & Plan Note (Signed)
Pt HA have features of migraine but also seem to exacerbate dizziness per the patient. It is hard to fully separate in time and symptoms whether truly these two are connected. No red flag symptoms and sometimes with just rest HA seem to improve.  -trial of imitrex -30 IM toradol given in clinic
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
## 2013-06-30 NOTE — Assessment & Plan Note (Addendum)
Pt continues to complain of this low back pain but having + straight leg raise test. No other warning signs or symptoms to warrant need for imaging modality at this time. No weakness or numbness found on exam.  -physical therapy  -flexeril -tylenol and ibuprophen for pain management  -education regarding walker and exercises

## 2013-07-04 ENCOUNTER — Telehealth: Payer: Self-pay | Admitting: Licensed Clinical Social Worker

## 2013-07-04 NOTE — Telephone Encounter (Signed)
[name] was referred to CSW for community resources.  CSW utilized [employer] to attempt to reach patient.  However, there was no answer and no voicemail available.  Placed call to pt's daughter, message left on dau's voicemail.  CSW will send information in mail to not delay services.

## 2013-07-05 ENCOUNTER — Encounter: Payer: Self-pay | Admitting: Licensed Clinical Social Worker

## 2013-07-06 NOTE — Telephone Encounter (Signed)
Pt has an appt on 07/13/13.  Letter with resource mailed.  Will notify PCP.

## 2013-07-13 ENCOUNTER — Ambulatory Visit (INDEPENDENT_AMBULATORY_CARE_PROVIDER_SITE_OTHER): Payer: No Typology Code available for payment source | Admitting: Internal Medicine

## 2013-07-13 ENCOUNTER — Encounter: Payer: Self-pay | Admitting: Internal Medicine

## 2013-07-13 VITALS — BP 126/87 | HR 95 | Temp 99.4°F | Ht 65.0 in | Wt 181.2 lb

## 2013-07-13 DIAGNOSIS — R42 Dizziness and giddiness: Secondary | ICD-10-CM

## 2013-07-13 DIAGNOSIS — N390 Urinary tract infection, site not specified: Secondary | ICD-10-CM | POA: Insufficient documentation

## 2013-07-13 DIAGNOSIS — R300 Dysuria: Secondary | ICD-10-CM

## 2013-07-13 HISTORY — DX: Urinary tract infection, site not specified: N39.0

## 2013-07-13 LAB — POCT URINALYSIS DIPSTICK
BILIRUBIN UA: NEGATIVE
Glucose, UA: NEGATIVE
Ketones, UA: NEGATIVE
NITRITE UA: NEGATIVE
PH UA: 5.5
PROTEIN UA: NEGATIVE
Spec Grav, UA: 1.025
Urobilinogen, UA: 1

## 2013-07-13 MED ORDER — CIPROFLOXACIN HCL 500 MG PO TABS: 500.0000 mg | ORAL_TABLET | Freq: Two times a day (BID) | ORAL | Status: DC

## 2013-07-13 NOTE — Patient Instructions (Signed)
Please bring your medicines with you each time you come.   Medicines may be  Eye drops  Herbal   Vitamins  Pills  Seeing these help us take care of you.  For the insurance please go to :  Social services   [facility]  [address]  [place], [place] [address] [phone]  [facility] [address] [place], [place] [address]  [phone]

## 2013-07-13 NOTE — Assessment & Plan Note (Signed)
Urine dipstick had small Hgb and +leu but neg nitrites. Given pt symptoms empiric treatment for UTI.  -UA, UCx sent - cipro 500 mg BID x3 days
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
## 2013-07-14 LAB — URINALYSIS, ROUTINE W REFLEX MICROSCOPIC
Bilirubin Urine: NEGATIVE
Glucose, UA: NEGATIVE mg/dL
Hgb urine dipstick: NEGATIVE
Ketones, ur: NEGATIVE mg/dL
Leukocytes, UA: NEGATIVE
NITRITE: NEGATIVE
PH: 6 (ref 5.0–8.0)
Protein, ur: NEGATIVE mg/dL
SPECIFIC GRAVITY, URINE: 1.015 (ref 1.005–1.030)
Urobilinogen, UA: 0.2 mg/dL (ref 0.0–1.0)

## 2013-07-14 NOTE — Assessment & Plan Note (Signed)
Pt continues to have some vertigo like symptoms although they have improved on triamterence-hctz. Therefore warranting that some inner pathology maybe contributing.  -referral to ENT -cont diuretics as helping

## 2013-07-15 LAB — URINE CULTURE: Colony Count: 2000

## 2013-07-16 NOTE — Progress Notes (Signed)
Attending physician note: Presenting problems, physical findings, and medications, reviewed with resident physician Dr. Christen BameNora Sadek, and I concur with her management. Cephas DarbyJames Granfortuna, M.D., FACP

## 2013-07-20 NOTE — Progress Notes (Signed)
Case discussed with Dr. Sadek soon after the resident saw the patient.  We reviewed the resident's history and exam and pertinent patient test results.  I agree with the assessment, diagnosis, and plan of care documented in the resident's note. 

## 2013-07-24 ENCOUNTER — Other Ambulatory Visit: Payer: Self-pay | Admitting: Internal Medicine

## 2013-07-24 ENCOUNTER — Telehealth: Payer: Self-pay | Admitting: *Deleted

## 2013-07-24 DIAGNOSIS — Z1231 Encounter for screening mammogram for malignant neoplasm of breast: Secondary | ICD-10-CM

## 2013-07-24 NOTE — Telephone Encounter (Signed)
Call to pt spoke with her daughter given appointment for Mammogram at [facility].  Scheduled for 07/26/2013 at 11:45 AM pt to arrive by 11:30 AM.  Pt to wear 2 piece clothing no powders, perfumes or deodorant.  Daughter voiced understanding of the plan.  Angelina Ok, RN 07/24/2013 2:23 PM.

## 2013-07-26 ENCOUNTER — Ambulatory Visit (HOSPITAL_COMMUNITY)
Admission: RE | Admit: 2013-07-26 | Discharge: 2013-07-26 | Disposition: A | Discharge status: Home / Self Care | Payer: No Typology Code available for payment source | Source: Ambulatory Visit | Attending: Internal Medicine | Admitting: Internal Medicine

## 2013-07-26 DIAGNOSIS — Z1231 Encounter for screening mammogram for malignant neoplasm of breast: Secondary | ICD-10-CM

## 2013-07-30 ENCOUNTER — Encounter: Payer: No Typology Code available for payment source | Admitting: Internal Medicine

## 2013-07-30 ENCOUNTER — Encounter: Payer: Self-pay | Admitting: *Deleted

## 2013-08-30 NOTE — Addendum Note (Signed)
Addended by: Dorie RankPOWERS, Xcaret Morad E on: 08/30/2013 07:00 PM   Modules accepted: Orders

## 2013-09-28 ENCOUNTER — Ambulatory Visit (HOSPITAL_COMMUNITY)
Admission: RE | Admit: 2013-09-28 | Discharge: 2013-09-28 | Disposition: A | Discharge status: Home / Self Care | Payer: No Typology Code available for payment source | Source: Ambulatory Visit | Attending: Internal Medicine | Admitting: Internal Medicine

## 2013-09-28 ENCOUNTER — Ambulatory Visit (INDEPENDENT_AMBULATORY_CARE_PROVIDER_SITE_OTHER): Payer: No Typology Code available for payment source | Admitting: Internal Medicine

## 2013-09-28 ENCOUNTER — Encounter: Payer: Self-pay | Admitting: Internal Medicine

## 2013-09-28 VITALS — BP 119/77 | HR 81 | Temp 97.8°F | Ht 65.0 in | Wt 182.5 lb

## 2013-09-28 DIAGNOSIS — M545 Low back pain, unspecified: Secondary | ICD-10-CM

## 2013-09-28 DIAGNOSIS — M5137 Other intervertebral disc degeneration, lumbosacral region: Secondary | ICD-10-CM | POA: Insufficient documentation

## 2013-09-28 DIAGNOSIS — M51379 Other intervertebral disc degeneration, lumbosacral region without mention of lumbar back pain or lower extremity pain: Secondary | ICD-10-CM | POA: Insufficient documentation

## 2013-09-28 DIAGNOSIS — R296 Repeated falls: Secondary | ICD-10-CM

## 2013-09-28 DIAGNOSIS — R29818 Other symptoms and signs involving the nervous system: Secondary | ICD-10-CM | POA: Insufficient documentation

## 2013-09-28 DIAGNOSIS — M549 Dorsalgia, unspecified: Secondary | ICD-10-CM | POA: Insufficient documentation

## 2013-09-28 DIAGNOSIS — Z9181 History of falling: Secondary | ICD-10-CM | POA: Insufficient documentation

## 2013-09-28 LAB — POCT GLYCOSYLATED HEMOGLOBIN (HGB A1C): Hemoglobin A1C: 5.6

## 2013-09-28 MED ORDER — GABAPENTIN 300 MG PO CAPS: 300.0000 mg | ORAL_CAPSULE | Freq: Three times a day (TID) | ORAL | Status: DC

## 2013-09-28 NOTE — Patient Instructions (Signed)
General Instructions:   Please bring your medicines with you each time you come to clinic.  Medicines may include prescription medications, over-the-counter medications, herbal remedies, eye drops, vitamins, or other pills.   Progress Toward Treatment Goals:  No flowsheet data found.  Self Care Goals & Plans:  Self Care Goal 07/13/2013  Manage my medications take my medicines as prescribed; bring my medications to every visit; refill my medications on time  Eat healthy foods drink diet soda or water instead of juice or soda; eat more vegetables; eat foods that are low in salt; eat baked foods instead of fried foods    No flowsheet data found.   Care Management & Community Referrals:  No flowsheet data found.

## 2013-09-30 DIAGNOSIS — M545 Low back pain, unspecified: Secondary | ICD-10-CM | POA: Insufficient documentation

## 2013-09-30 NOTE — Assessment & Plan Note (Signed)
Pt has no red flag symptoms today during history or on physical exam. Likely etiology include mechanical (given pt relief with tylenol, worsening with certain movements and no sciatica in history or reproducible on exam), non-mechanical less likely given no red flag symptoms or other AI symptoms such as fever, joint pain/effusions), and visceral as pt has no urinary symptoms or abdominal pain. It is concerning though that the patient has not achieved any significant relief in at least 4 months of conservative therapy (although pt has not been able to complete PT). There is no lumbar XRays in the history.  -lumbar Xray -consider ESR at next visit if has no relief -HgbA1c was 6.5 in 2014 therefore maybe contributing to "prickling sensation on bottoms of feet" -cont Tylenol and NSAIDs 
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
## 2013-10-01 NOTE — Progress Notes (Signed)
Case discussed with Dr. Sadek soon after the resident saw the patient.  We reviewed the resident's history and exam and pertinent patient test results.  I agree with the assessment, diagnosis, and plan of care documented in the resident's note. 

## 2013-11-02 ENCOUNTER — Ambulatory Visit (INDEPENDENT_AMBULATORY_CARE_PROVIDER_SITE_OTHER): Payer: No Typology Code available for payment source | Admitting: Internal Medicine

## 2013-11-02 ENCOUNTER — Encounter: Payer: Self-pay | Admitting: Internal Medicine

## 2013-11-02 VITALS — BP 115/52 | HR 77 | Temp 97.9°F | Ht 65.0 in | Wt 184.1 lb

## 2013-11-02 DIAGNOSIS — R51 Headache: Secondary | ICD-10-CM

## 2013-11-02 DIAGNOSIS — M545 Low back pain, unspecified: Secondary | ICD-10-CM

## 2013-11-02 DIAGNOSIS — R300 Dysuria: Secondary | ICD-10-CM

## 2013-11-02 DIAGNOSIS — I951 Orthostatic hypotension: Secondary | ICD-10-CM

## 2013-11-02 LAB — CBC
HCT: 36.6 % (ref 36.0–46.0)
Hemoglobin: 12.2 g/dL (ref 12.0–15.0)
MCH: 28.4 pg (ref 26.0–34.0)
MCHC: 33.3 g/dL (ref 30.0–36.0)
MCV: 85.1 fL (ref 78.0–100.0)
PLATELETS: 209 10*3/uL (ref 150–400)
RBC: 4.3 MIL/uL (ref 3.87–5.11)
RDW: 12.3 % (ref 11.5–15.5)
WBC: 7 10*3/uL (ref 4.0–10.5)

## 2013-11-02 LAB — COMPLETE METABOLIC PANEL WITH GFR
ALT: 15 U/L (ref 0–35)
AST: 16 U/L (ref 0–37)
Albumin: 3.5 g/dL (ref 3.5–5.2)
Alkaline Phosphatase: 72 U/L (ref 39–117)
BUN: 11 mg/dL (ref 6–23)
CO2: 26 mEq/L (ref 19–32)
CREATININE: 0.6 mg/dL (ref 0.50–1.10)
Calcium: 8.7 mg/dL (ref 8.4–10.5)
Chloride: 103 mEq/L (ref 96–112)
GFR, Est Non African American: >89 mL/min
Glucose, Bld: 98 mg/dL (ref 70–99)
Potassium: 4.5 mEq/L (ref 3.5–5.3)
Sodium: 140 mEq/L (ref 135–145)
Total Protein: 7.5 g/dL (ref 6.0–8.3)

## 2013-11-02 LAB — POCT URINALYSIS DIPSTICK
BILIRUBIN UA: NEGATIVE
GLUCOSE UA: NEGATIVE
Ketones, UA: NEGATIVE
NITRITE UA: NEGATIVE
PH UA: 6.5
Protein, UA: NEGATIVE
Spec Grav, UA: 1.015
Urobilinogen, UA: 0.2

## 2013-11-02 LAB — URINALYSIS, ROUTINE W REFLEX MICROSCOPIC
BILIRUBIN URINE: NEGATIVE
Glucose, UA: NEGATIVE mg/dL
Hgb urine dipstick: NEGATIVE
KETONES UR: NEGATIVE mg/dL
Leukocytes, UA: NEGATIVE
Nitrite: NEGATIVE
PROTEIN: NEGATIVE mg/dL
Specific Gravity, Urine: 1.006 (ref 1.005–1.030)
UROBILINOGEN UA: 1 mg/dL (ref 0.0–1.0)
pH: 6.5 (ref 5.0–8.0)

## 2013-11-02 MED ORDER — SODIUM CHLORIDE 0.9 % IV BOLUS (SEPSIS)
500.0000 mL | Freq: Once | INTRAVENOUS | Status: AC
  Administered 2013-11-02: 500 mL via INTRAVENOUS

## 2013-11-02 MED ORDER — KETOROLAC TROMETHAMINE 30 MG/ML IJ SOLN: 30.0000 mg | Freq: Once | INTRAMUSCULAR | Status: DC

## 2013-11-02 MED ORDER — KETOROLAC TROMETHAMINE 30 MG/ML IJ SOLN
30.0000 mg | Freq: Once | INTRAMUSCULAR | Status: AC
  Administered 2013-11-02: 30 mg via INTRAVENOUS

## 2013-11-02 MED ORDER — NITROFURANTOIN MONOHYD MACRO 100 MG PO CAPS: 100.0000 mg | ORAL_CAPSULE | Freq: Two times a day (BID) | ORAL | Status: DC

## 2013-11-02 MED ORDER — SUMATRIPTAN SUCCINATE 50 MG PO TABS: 50.0000 mg | ORAL_TABLET | Freq: Once | ORAL | Status: DC | PRN

## 2013-11-02 MED ORDER — GABAPENTIN 300 MG PO CAPS: 300.0000 mg | ORAL_CAPSULE | Freq: Three times a day (TID) | ORAL | Status: DC

## 2013-11-02 NOTE — Progress Notes (Signed)
Internal Medicine Clinic Attending Date of visit: 11/02/2013   Case discussed with Dr. Sadek soon after the resident saw the patient.  We reviewed the resident's history and exam and pertinent patient test results.  I agree with the assessment, diagnosis, and plan of care documented in the resident's note. 
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
## 2013-11-02 NOTE — Assessment & Plan Note (Addendum)
On urine dip the patient had positive leuks and some RBCs in the setting of having some dysuria and suprapubic tenderness will treat empirically for UTI. -UA, urine micro, urine culture, and CBC pending -Macrobid 100 mg twice a day for 7 days

## 2013-11-02 NOTE — Assessment & Plan Note (Signed)
The patient has some symptomatic hypotension with some dizziness and lightheaded feelings when changing positions such as lying to sitting or sitting to standing. Patient has borderline low blood pressure reading today in clinic in the setting of poor by mouth intake at baseline. Extensive education in terms of increasing fluid intake by other means such as fruits and other liquids was reiterated with the patient. -500 cc IV fluid normal saline bolus was administered during this visit -CMet

## 2013-11-02 NOTE — Assessment & Plan Note (Signed)
Lumbar x-rays during the last visit showed some degenerative disc disease at the L5-S1 distribution which is in line with the patient's symptoms. The patient was unable to complete physical therapy and unable to receive the gabapentin that was prescribed previously and patient is very interested in starting that up at this time. -Referral to physical therapy -Paper prescription for gabapentin was given at this time

## 2013-11-03 LAB — URINE CULTURE: Colony Count: 4000
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
## 2013-12-18 ENCOUNTER — Ambulatory Visit: Payer: No Typology Code available for payment source

## 2013-12-19 ENCOUNTER — Ambulatory Visit: Payer: No Typology Code available for payment source

## 2013-12-23 ENCOUNTER — Emergency Department (HOSPITAL_BASED_OUTPATIENT_CLINIC_OR_DEPARTMENT_OTHER)
Admission: EM | Admit: 2013-12-23 | Discharge: 2013-12-23 | Disposition: A | Discharge status: Home / Self Care | Payer: Self-pay | Attending: Emergency Medicine | Admitting: Emergency Medicine

## 2013-12-23 ENCOUNTER — Emergency Department (HOSPITAL_BASED_OUTPATIENT_CLINIC_OR_DEPARTMENT_OTHER): Payer: Self-pay

## 2013-12-23 ENCOUNTER — Encounter (HOSPITAL_BASED_OUTPATIENT_CLINIC_OR_DEPARTMENT_OTHER): Payer: Self-pay | Admitting: Emergency Medicine

## 2013-12-23 DIAGNOSIS — R110 Nausea: Secondary | ICD-10-CM | POA: Insufficient documentation

## 2013-12-23 DIAGNOSIS — R42 Dizziness and giddiness: Secondary | ICD-10-CM | POA: Insufficient documentation

## 2013-12-23 DIAGNOSIS — N390 Urinary tract infection, site not specified: Secondary | ICD-10-CM | POA: Insufficient documentation

## 2013-12-23 LAB — URINALYSIS, ROUTINE W REFLEX MICROSCOPIC
BILIRUBIN URINE: NEGATIVE
Glucose, UA: NEGATIVE mg/dL
Hgb urine dipstick: NEGATIVE
KETONES UR: NEGATIVE mg/dL
NITRITE: NEGATIVE
PH: 7 (ref 5.0–8.0)
Protein, ur: NEGATIVE mg/dL
Specific Gravity, Urine: 1.011 (ref 1.005–1.030)
UROBILINOGEN UA: 0.2 mg/dL (ref 0.0–1.0)

## 2013-12-23 LAB — CBC WITH DIFFERENTIAL/PLATELET
BASOS ABS: 0 10*3/uL (ref 0.0–0.1)
BASOS PCT: 1 % (ref 0–1)
EOS PCT: 3 % (ref 0–5)
Eosinophils Absolute: 0.2 10*3/uL (ref 0.0–0.7)
HEMATOCRIT: 37.6 % (ref 36.0–46.0)
HEMOGLOBIN: 12.6 g/dL (ref 12.0–15.0)
Lymphocytes Relative: 41 % (ref 12–46)
Lymphs Abs: 2.4 10*3/uL (ref 0.7–4.0)
MCH: 28.8 pg (ref 26.0–34.0)
MCHC: 33.5 g/dL (ref 30.0–36.0)
MCV: 86 fL (ref 78.0–100.0)
MONOS PCT: 8 % (ref 3–12)
Monocytes Absolute: 0.4 10*3/uL (ref 0.1–1.0)
Neutro Abs: 2.8 10*3/uL (ref 1.7–7.7)
Neutrophils Relative %: 47 % (ref 43–77)
Platelets: 199 10*3/uL (ref 150–400)
RBC: 4.37 MIL/uL (ref 3.87–5.11)
RDW: 12.3 % (ref 11.5–15.5)
WBC: 5.8 10*3/uL (ref 4.0–10.5)

## 2013-12-23 LAB — COMPREHENSIVE METABOLIC PANEL
ALBUMIN: 3.5 g/dL (ref 3.5–5.2)
ALT: 12 U/L (ref 0–35)
ANION GAP: 13 (ref 5–15)
AST: 14 U/L (ref 0–37)
Alkaline Phosphatase: 80 U/L (ref 39–117)
BILIRUBIN TOTAL: 0.2 mg/dL — AB (ref 0.3–1.2)
BUN: 12 mg/dL (ref 6–23)
CALCIUM: 9.2 mg/dL (ref 8.4–10.5)
CHLORIDE: 103 meq/L (ref 96–112)
CO2: 25 meq/L (ref 19–32)
Creatinine, Ser: 0.6 mg/dL (ref 0.50–1.10)
GFR calc Af Amer: >90 mL/min (ref >90)
Glucose, Bld: 126 mg/dL — ABNORMAL HIGH (ref 70–99)
Potassium: 4.2 mEq/L (ref 3.7–5.3)
Sodium: 141 mEq/L (ref 137–147)
Total Protein: 7.8 g/dL (ref 6.0–8.3)

## 2013-12-23 LAB — URINE MICROSCOPIC-ADD ON

## 2013-12-23 LAB — TROPONIN I: Troponin I: <0.3 ng/mL (ref <0.30)

## 2013-12-23 MED ORDER — KETOROLAC TROMETHAMINE 60 MG/2ML IM SOLN
60.0000 mg | Freq: Once | INTRAMUSCULAR | Status: AC
  Administered 2013-12-23: 60 mg via INTRAMUSCULAR
  Filled 2013-12-23: qty 2

## 2013-12-23 MED ORDER — GI COCKTAIL ~~LOC~~
30.0000 mL | Freq: Once | ORAL | Status: AC
Start: 2013-12-23 — End: 2013-12-23
  Administered 2013-12-23: 30 mL via ORAL
  Filled 2013-12-23: qty 30

## 2013-12-23 MED ORDER — IBUPROFEN 800 MG PO TABS: 800.0000 mg | ORAL_TABLET | Freq: Three times a day (TID) | ORAL | Status: DC

## 2013-12-23 MED ORDER — NITROFURANTOIN MONOHYD MACRO 100 MG PO CAPS: 100.0000 mg | ORAL_CAPSULE | Freq: Two times a day (BID) | ORAL | Status: DC

## 2013-12-23 MED ORDER — NITROFURANTOIN MONOHYD MACRO 100 MG PO CAPS
100.0000 mg | ORAL_CAPSULE | Freq: Once | ORAL | Status: AC
  Administered 2013-12-23: 100 mg via ORAL
  Filled 2013-12-23: qty 1

## 2013-12-23 MED ORDER — ONDANSETRON 8 MG PO TBDP
8.0000 mg | ORAL_TABLET | Freq: Once | ORAL | Status: AC
  Administered 2013-12-23: 8 mg via ORAL
  Filled 2013-12-23: qty 1

## 2013-12-23 MED ORDER — ONDANSETRON 8 MG PO TBDP: ORAL_TABLET | ORAL | Status: DC

## 2013-12-23 NOTE — ED Notes (Signed)
PIV removed. 20g right AC, cath intact. Site WNL

## 2013-12-23 NOTE — ED Provider Notes (Signed)
CSN: [id number]     Arrival date & time 12/23/13  0440 History   First MD Initiated Contact with Patient 12/23/13 [phone]     Chief Complaint  Patient presents with  . Headache     (Consider location/radiation/quality/duration/timing/severity/associated sxs/prior Treatment) Patient is a 48 y.o. female presenting with dizziness and abdominal pain. The history is provided by the patient. The history is limited by a language barrier.  Dizziness Quality:  Unable to specify Severity:  Moderate Onset quality:  Gradual Timing:  Constant Progression:  Unchanged Chronicity:  Recurrent Context: not with head movement   Relieved by:  Nothing Worsened by:  Nothing tried Ineffective treatments:  None tried Associated symptoms: nausea   Associated symptoms: no chest pain, no headaches, no shortness of breath, no syncope, no vomiting and no weakness   Associated symptoms comment:  Epigastric pain Risk factors: no hx of stroke and no new medications   Abdominal Pain Pain location:  Epigastric Pain radiates to:  Does not radiate Pain severity:  Moderate Onset quality:  Sudden Duration:  1 day Timing:  Constant Progression:  Unchanged Context: not laxative use   Relieved by:  Nothing Worsened by:  Nothing tried Ineffective treatments:  None tried Associated symptoms: dysuria and nausea   Associated symptoms: no chest pain, no chills, no shortness of breath and no vomiting   Risk factors: no recent hospitalization     History reviewed. No pertinent past medical history. History reviewed. No pertinent past surgical history. History reviewed. No pertinent family history. History  Substance Use Topics  . Smoking status: Never Smoker   . Smokeless tobacco: Never Used  . Alcohol Use: No   OB History   Grav Para Term Preterm Abortions TAB SAB Ect Mult Living                 Review of Systems  Constitutional: Negative for chills.  Respiratory: Negative for shortness of breath.    Cardiovascular: Negative for chest pain and syncope.  Gastrointestinal: Positive for nausea and abdominal pain. Negative for vomiting.  Genitourinary: Positive for dysuria.  Neurological: Positive for dizziness. Negative for syncope, facial asymmetry, speech difficulty, weakness, numbness and headaches.  All other systems reviewed and are negative.     Allergies  Review of patient's allergies indicates no known allergies.  Home Medications   Prior to Admission medications   Not on File   BP 114/49  Pulse 71  Temp(Src) 98.2 F (36.8 C)  Resp 18  SpO2 98% Physical Exam  Constitutional: She is oriented to person, place, and time. She appears well-developed and well-nourished. No distress.  HENT:  Head: Normocephalic and atraumatic.  Right Ear: External ear normal.  Left Ear: External ear normal.  Mouth/Throat: Oropharynx is clear and moist.  Eyes: Conjunctivae and EOM are normal. Pupils are equal, round, and reactive to light.  Neck: Normal range of motion. Neck supple.  Cardiovascular: Normal rate, regular rhythm and intact distal pulses.   Pulmonary/Chest: Effort normal and breath sounds normal. No respiratory distress. She has no wheezes. She has no rales.  Abdominal: Soft. Bowel sounds are normal. There is no tenderness. There is no rebound and no guarding.  Musculoskeletal: Normal range of motion.  Neurological: She is alert and oriented to person, place, and time. She has normal reflexes. She displays normal reflexes. No cranial nerve deficit. She exhibits normal muscle tone. Coordination normal.  Skin: Skin is warm and dry.  Psychiatric: She has a normal mood and affect.  ED Course  Procedures (including critical care time) Labs Review Labs Reviewed  CBC WITH DIFFERENTIAL  COMPREHENSIVE METABOLIC PANEL  TROPONIN I    Imaging Review Dg Abd Acute W/chest  12/23/2013   CLINICAL DATA:  Dizziness since 3 p.m. Saturday.  Nausea.  EXAM: ACUTE ABDOMEN SERIES  (ABDOMEN 2 VIEW & CHEST 1 VIEW)  COMPARISON:  None.  FINDINGS: Shallow inspiration. Mild cardiac enlargement without significant vascular congestion. No focal airspace consolidation in the lungs. No blunting of costophrenic angles. No pneumothorax.  Scattered gas and stool in the colon. No small or large bowel distention. No free intra-abdominal air. No abnormal air/ fluid levels. No radiopaque stones. Visualized bones appear intact.  IMPRESSION: Cardiac enlargement. No evidence of active pulmonary disease. Nonobstructive bowel gas pattern.   Electronically Signed   By: Burman NievesWilliam  Stevens M.D.   On: 12/23/2013 05:57     EKG Interpretation   Date/Time:  Sunday December 23 2013 05:21:52 EDT Ventricular Rate:  73 PR Interval:  202 QRS Duration: 82 QT Interval:  406 QTC Calculation: 447 R Axis:   36 Text Interpretation:  Normal sinus rhythm Confirmed by [facility]  MD,  Broghan Pannone ([id number]) on 12/23/2013 5:31:54 AM      MDM   Final diagnoses:  Nausea    Patient has many complaints from epigastric pain to HA to dysuria.  In the setting of 24 hours of symptoms ACS is excluded.  She is taking PO.  Neurologic exam is normal and ear exam is normal.  Will treat for UTI given symptoms follow up with your PMD Monday am   Darion Milewski K Thea Holshouser-Rasch, MD 12/23/13 [phone]

## 2013-12-23 NOTE — ED Notes (Signed)
Pt re[portwed to daughter that she was not feeling well and was having dizziness since 3 pm Saturday. Admits to nausea but no emesis taking sips water no solid foods tylenol at 1500 no change

## 2013-12-23 NOTE — ED Notes (Signed)
D/c home with family- rx x 3 given for macrobid, zofran and ibuprofen

## 2013-12-24 ENCOUNTER — Ambulatory Visit (INDEPENDENT_AMBULATORY_CARE_PROVIDER_SITE_OTHER): Payer: Self-pay | Admitting: Internal Medicine

## 2013-12-24 ENCOUNTER — Encounter: Payer: Self-pay | Admitting: Internal Medicine

## 2013-12-24 VITALS — BP 130/60 | HR 86 | Temp 98.0°F | Ht 65.0 in | Wt 185.5 lb

## 2013-12-24 DIAGNOSIS — N390 Urinary tract infection, site not specified: Secondary | ICD-10-CM

## 2013-12-24 DIAGNOSIS — J0140 Acute pansinusitis, unspecified: Secondary | ICD-10-CM | POA: Insufficient documentation

## 2013-12-24 DIAGNOSIS — Z0000 Encounter for general adult medical examination without abnormal findings: Secondary | ICD-10-CM

## 2013-12-24 DIAGNOSIS — Z23 Encounter for immunization: Secondary | ICD-10-CM

## 2013-12-24 DIAGNOSIS — K1379 Other lesions of oral mucosa: Secondary | ICD-10-CM | POA: Insufficient documentation

## 2013-12-24 DIAGNOSIS — R220 Localized swelling, mass and lump, head: Secondary | ICD-10-CM

## 2013-12-24 HISTORY — DX: Encounter for general adult medical examination without abnormal findings: Z00.00

## 2013-12-24 MED ORDER — CIPROFLOXACIN HCL 500 MG PO TABS: 500.0000 mg | ORAL_TABLET | Freq: Two times a day (BID) | ORAL | Status: DC

## 2013-12-24 MED ORDER — CIPROFLOXACIN HCL 500 MG PO TABS: 500.0000 mg | ORAL_TABLET | Freq: Two times a day (BID) | ORAL | Status: AC

## 2013-12-24 NOTE — Assessment & Plan Note (Signed)
Worsening x1 week. Severe tenderness to palpation mainly in maxillary and frontal region. Also endorses L ear pain.   -will try 10 day course of ciprofloxacin at this time 500mg  bid (will also treat her UTI) and affordable for family with the health department -RTC if no better in 10 days or sooner if needed

## 2013-12-24 NOTE — Assessment & Plan Note (Signed)
Flu vaccine was given today 

## 2013-12-24 NOTE — Assessment & Plan Note (Addendum)
Cannot see UC encounter from yesterday (possibly due to epic chart merge) however she brought in the AVS from the visit which showed zofran, ibuprofen, and macrobid prescriptions for UTI that they have not filled yet.  Continues to have dysuria. Denies hematuria or frequency. Treated successfully in the past with macrobid.   -canceled macrobid, and changed to cipro for simultaneous treatment of UTI and sinusitis as well

## 2013-12-24 NOTE — Assessment & Plan Note (Signed)
Left inner cheek, tender to palpation. Likely source of bleeding although no blood on today's examination. Unclear what it is.   -dental clinic referral for further evaluation of mass ?biopsy
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
## 2013-12-24 NOTE — Patient Instructions (Signed)
General Instructions:  Please bring your medicines with you each time you come to clinic.  Medicines may include prescription medications, over-the-counter medications, herbal remedies, eye drops, vitamins, or other pills.  Please take ciprofloxacin 500mg  twice a day for 10 days  If no improvement or worsening in symptoms, please call [phone] or return to clinic  Progress Toward Treatment Goals:  Treatment Goal 12/24/2013  Prevent falls at goal    Self Care Goals & Plans:  Self Care Goal 11/02/2013  Manage my medications take my medicines as prescribed; bring my medications to every visit; refill my medications on time  Eat healthy foods drink diet soda or water instead of juice or soda; eat more vegetables; eat foods that are low in salt; eat baked foods instead of fried foods; eat fruit for snacks and desserts    No flowsheet data found.   Care Management & Community Referrals:  No flowsheet data found.    Sinusitis Sinusitis is redness, soreness, and puffiness (inflammation) of the air pockets in the bones of your face (sinuses). The redness, soreness, and puffiness can cause air and mucus to get trapped in your sinuses. This can allow germs to grow and cause an infection.  HOME CARE   Drink enough fluids to keep your pee (urine) clear or pale yellow.  Use a humidifier in your home.  Run a hot shower to create steam in the bathroom. Sit in the bathroom with the door closed. Breathe in the steam 3-4 times a day.  Put a warm, moist washcloth on your face 3-4 times a day, or as told by your doctor.  Use salt water sprays (saline sprays) to wet the thick fluid in your nose. This can help the sinuses drain.  Only take medicine as told by your doctor. GET HELP RIGHT AWAY IF:   Your pain gets worse.  You have very bad headaches.  You are sick to your stomach (nauseous).  You throw up (vomit).  You are very sleepy (drowsy) all the time.  Your face is puffy  (swollen).  Your vision changes.  You have a stiff neck.  You have trouble breathing. MAKE SURE YOU:   Understand these instructions.  Will watch your condition.  Will get help right away if you are not doing well or get worse. Document Released: 08/04/2007 Document Revised: 11/10/2011 Document Reviewed: 09/21/2011 [facility] Patient Information 2015 [employer], [place]. This information is not intended to replace advice given to you by your health care provider. Make sure you discuss any questions you have with your health care provider.  Ciprofloxacin tablets What is this medicine? CIPROFLOXACIN (sip roe FLOX a sin) is a quinolone antibiotic. It is used to treat certain kinds of bacterial infections. It will not work for colds, flu, or other viral infections. This medicine may be used for other purposes; ask your health care provider or pharmacist if you have questions. COMMON BRAND NAME(S): Cipro What should I tell my health care provider before I take this medicine? They need to know if you have any of these conditions: -bone problems -cerebral disease -joint problems -irregular heartbeat -kidney disease -liver disease -myasthenia gravis -seizure disorder -tendon problems -an unusual or allergic reaction to ciprofloxacin, other antibiotics or medicines, foods, dyes, or preservatives -pregnant or trying to get pregnant -breast-feeding How should I use this medicine? Take this medicine by mouth with a glass of water. Follow the directions on the prescription label. Take your medicine at regular intervals. Do not take your medicine  more often than directed. Take all of your medicine as directed even if you think your are better. Do not skip doses or stop your medicine early. You can take this medicine with food or on an empty stomach. It can be taken with a meal that contains dairy or calcium, but do not take it alone with a dairy product, like milk or yogurt or calcium-fortified  juice. A special MedGuide will be given to you by the pharmacist with each prescription and refill. Be sure to read this information carefully each time. Talk to your pediatrician regarding the use of this medicine in children. Special care may be needed. Overdosage: If you think you have taken too much of this medicine contact a poison control center or emergency room at once. NOTE: This medicine is only for you. Do not share this medicine with others. What if I miss a dose? If you miss a dose, take it as soon as you can. If it is almost time for your next dose, take only that dose. Do not take double or extra doses. What may interact with this medicine? Do not take this medicine with any of the following medications: -cisapride -droperidol -terfenadine -tizanidine This medicine may also interact with the following medications: -antacids -birth control pills -caffeine -cyclosporin -didanosine (ddI) buffered tablets or powder -medicines for diabetes -medicines for inflammation like ibuprofen, naproxen -methotrexate -multivitamins -omeprazole -phenytoin -probenecid -sucralfate -theophylline -warfarin This list may not describe all possible interactions. Give your health care provider a list of all the medicines, herbs, non-prescription drugs, or dietary supplements you use. Also tell them if you smoke, drink alcohol, or use illegal drugs. Some items may interact with your medicine. What should I watch for while using this medicine? Tell your doctor or health care professional if your symptoms do not improve. Do not treat diarrhea with over the counter products. Contact your doctor if you have diarrhea that lasts more than 2 days or if it is severe and watery. You may get drowsy or dizzy. Do not drive, use machinery, or do anything that needs mental alertness until you know how this medicine affects you. Do not stand or sit up quickly, especially if you are an older patient. This  reduces the risk of dizzy or fainting spells. This medicine can make you more sensitive to the sun. Keep out of the sun. If you cannot avoid being in the sun, wear protective clothing and use sunscreen. Do not use sun lamps or tanning beds/booths. Avoid antacids, aluminum, calcium, iron, magnesium, and zinc products for 6 hours before and 2 hours after taking a dose of this medicine. What side effects may I notice from receiving this medicine? Side effects that you should report to your doctor or health care professional as soon as possible: - allergic reactions like skin rash, itching or hives, swelling of the face, lips, or tongue - breathing problems - confusion, nightmares or hallucinations - feeling faint or lightheaded, falls - irregular heartbeat - joint, muscle or tendon pain or swelling - pain or trouble passing urine -persistent headache with or without blurred vision - redness, blistering, peeling or loosening of the skin, including inside the mouth - seizure - unusual pain, numbness, tingling, or weakness Side effects that usually do not require medical attention (report to your doctor or health care professional if they continue or are bothersome): - diarrhea - nausea or stomach upset - white patches or sores in the mouth This list may not describe all possible side  effects. Call your doctor for medical advice about side effects. You may report side effects to FDA at 1-800-FDA-1088. Where should I keep my medicine? Keep out of the reach of children. Store at room temperature below 30 degrees C (86 degrees F). Keep container tightly closed. Throw away any unused medicine after the expiration date. NOTE: This sheet is a summary. It may not cover all possible information. If you have questions about this medicine, talk to your doctor, pharmacist, or health care provider.  2015, [employer]. (2012-09-21 16:10:46)

## 2013-12-25 ENCOUNTER — Encounter: Payer: Self-pay | Admitting: Internal Medicine

## 2013-12-25 NOTE — Progress Notes (Signed)
I saw and evaluated the patient. I personally confirmed the key portions of Dr. Qureshi's history and exam and reviewed pertinent patient test results. The assessment, diagnosis, and plan were formulated together and I agree with the documentation in the resident's note. 

## 2014-02-11 ENCOUNTER — Ambulatory Visit (HOSPITAL_COMMUNITY)
Admission: RE | Admit: 2014-02-11 | Discharge: 2014-02-11 | Disposition: A | Discharge status: Home / Self Care | Payer: Self-pay | Source: Ambulatory Visit | Attending: Internal Medicine | Admitting: Internal Medicine

## 2014-02-11 ENCOUNTER — Ambulatory Visit (INDEPENDENT_AMBULATORY_CARE_PROVIDER_SITE_OTHER): Payer: Self-pay | Admitting: Internal Medicine

## 2014-02-11 ENCOUNTER — Encounter: Payer: Self-pay | Admitting: Internal Medicine

## 2014-02-11 VITALS — BP 152/69 | HR 77 | Temp 98.3°F | Ht 65.0 in | Wt 183.6 lb

## 2014-02-11 DIAGNOSIS — K219 Gastro-esophageal reflux disease without esophagitis: Secondary | ICD-10-CM

## 2014-02-11 DIAGNOSIS — R000 Tachycardia, unspecified: Secondary | ICD-10-CM | POA: Insufficient documentation

## 2014-02-11 DIAGNOSIS — J0111 Acute recurrent frontal sinusitis: Secondary | ICD-10-CM

## 2014-02-11 DIAGNOSIS — R42 Dizziness and giddiness: Secondary | ICD-10-CM

## 2014-02-11 DIAGNOSIS — J0141 Acute recurrent pansinusitis: Secondary | ICD-10-CM

## 2014-02-11 MED ORDER — AMOXICILLIN-POT CLAVULANATE 500-125 MG PO TABS: 1.0000 | ORAL_TABLET | Freq: Three times a day (TID) | ORAL | Status: DC

## 2014-02-11 MED ORDER — OMEPRAZOLE 20 MG PO CPDR
20.0000 mg | DELAYED_RELEASE_CAPSULE | Freq: Every day | ORAL | Status: DC
Start: 2014-02-11 — End: 2014-07-12
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
## 2014-02-11 NOTE — Assessment & Plan Note (Signed)
Pt has epigastric pain, has cough at night when she lie down, and feeling like something is choking her only when she lies down at night. This is associated with cough and fast heart beat.  Plan- Omeprazole- 20mg  daily. - Pt to sit up after meals for at least 2-3 hrs.  - Though perceived fast heart rate is most likely anxiety symptom related, heart rate was 70s in clinic and regular, decided to get an EKG as pt is insisting I get a chest xray to check her heart. Assured pt an EKG would be best and that a chest xray would not be helpful.  EKG in clinic- Rate- 80bpm, regular, sinus, PR interval- 184, QRS- 80s, QtC- 445, no St or T wave abnormalities, no Q waves.

## 2014-02-11 NOTE — Patient Instructions (Signed)
We will be prescribing 2 new medications called Augmentin- Take 1 tablet three times a day for 5 days.  The second tablet is called omeprazole. Take one tablet once a day, everyday.

## 2014-02-11 NOTE — Assessment & Plan Note (Signed)
Still present. Etiology not identified. Pt has been work up, including MRI- without identified etiology, sent to neurology and ENT. Also tried on Meclizine, without improvement. Orthostatic vitals in clinic- Pt not orthostatic.   Plan- Refer to neuro rehab for vestibular rehab.

## 2014-02-11 NOTE — Assessment & Plan Note (Addendum)
Pt with complaints of pain and tenderness frontal region and ethmoid region. Pt was treated with Ciprofloxacin with improvement but she did not complete ten day 500mg  BID course, she has 5 pills left which she brought to clinic today.  She did not complete course, because medication made her tired. Pt endorses subjective fevers, though unreliable hx as she says it is sometimes present at night. I do not see any indication for Ct scan today, though this might be a recurrence.    Plan- Amoxicillin-Clavulanate- 500-125mg  TID for 5 days.

## 2014-02-13 NOTE — Addendum Note (Signed)
Addended by: Onnie BoerEMOKPAE, EJIROGHENE E on: 02/13/2014 01:58 PM   Modules accepted: Level of Service

## 2014-02-14 NOTE — Progress Notes (Signed)
Internal Medicine Clinic Attending  Case discussed with Dr. Emokpae soon after the resident saw the patient.  We reviewed the resident's history and exam and pertinent patient test results.  I agree with the assessment, diagnosis, and plan of care documented in the resident's note. 

## 2014-03-12 ENCOUNTER — Ambulatory Visit: Payer: No Typology Code available for payment source | Admitting: Physical Therapy

## 2014-03-22 ENCOUNTER — Ambulatory Visit: Payer: No Typology Code available for payment source | Admitting: Rehabilitative and Restorative Service Providers"

## 2014-04-02 ENCOUNTER — Ambulatory Visit: Payer: No Typology Code available for payment source | Admitting: Rehabilitative and Restorative Service Providers"

## 2014-04-04 ENCOUNTER — Ambulatory Visit
Payer: No Typology Code available for payment source | Attending: Internal Medicine | Admitting: Rehabilitative and Restorative Service Providers"

## 2014-04-04 VITALS — BP 88/69

## 2014-04-04 DIAGNOSIS — R42 Dizziness and giddiness: Secondary | ICD-10-CM

## 2014-04-04 NOTE — Patient Instructions (Signed)
Head Motion: Up and Down   Sitting, slowly move head down with eyes open. Then, return to middle. Hold position until symptoms subside. Repeat __5__ times per session. Do __2__ sessions per day.  Copyright  VHI. All rights reserved.  Head Motion: Side to Side   Sitting, slowly move head to right with eyes open. Hold position until symptoms subside. Then, move head slowly to opposite side. Hold position until symptoms subside. SMALL HEAD MOVEMENTS TO BEGIN THIS EXERCISE. Repeat __5__ times per session. Do __2__ sessions per day.  Copyright  VHI. All rights reserved.
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
## 2014-04-11 ENCOUNTER — Ambulatory Visit: Payer: No Typology Code available for payment source | Admitting: Rehabilitative and Restorative Service Providers"

## 2014-04-15 ENCOUNTER — Ambulatory Visit: Payer: No Typology Code available for payment source | Admitting: Rehabilitative and Restorative Service Providers"

## 2014-04-18 ENCOUNTER — Ambulatory Visit: Payer: No Typology Code available for payment source | Admitting: Rehabilitative and Restorative Service Providers"

## 2014-04-18 DIAGNOSIS — R42 Dizziness and giddiness: Secondary | ICD-10-CM

## 2014-04-18 NOTE — Patient Instructions (Addendum)
Tip Card 1.The goal of habituation training is to assist in decreasing symptoms of vertigo, dizziness, or nausea provoked by specific head and body motions. 2.These exercises may initially increase symptoms; however, be persistent and work through symptoms. With repetition and time, the exercises will assist in reducing or eliminating symptoms. 3.Exercises should be stopped and discussed with the therapist if you experience any of the following: - Sudden change or fluctuation in hearing - New onset of ringing in the ears, or increase in current intensity - Any fluid discharge from the ear - Severe pain in neck or back - Extreme nausea  Copyright  VHI. All rights reserved.  Sit to Side-Lying   Sit on edge of bed. Lie down onto the right side and hold until dizziness stops.  Return to sitting.  Hold until dizziness stops.  Repeat 5 times. Do __2__ sessions per day.  Copyright  VHI. All rights reserved.  Rolling   With 2 pillows under head, start on back. Roll slowly to right. Hold and let symptoms settle, then slowly roll to left side. Hold and let symptoms settle. Repeat sequence __3__ times per session. Do _2___ sessions per day.  Copyright  VHI. All rights reserved.  Gaze Stabilization: Sitting   Keeping eyes on target on wall 3 feet away, move head side to side for __30__ seconds. Repeat while moving head up and down for _30___ seconds. Do _2___ sessions per day.   Copyright  VHI. All rights reserved.   Walking Program:  Begin walking for exercise for 6-8 minutes, 1-2 times/day, 4 days/week.   Progress your walking program by adding 2 minutes to your routine each week, as tolerated. Be sure to wear good walking shoes, walk in a safe environment and only progress to your tolerance.
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
## 2014-04-22 ENCOUNTER — Ambulatory Visit: Payer: No Typology Code available for payment source | Admitting: Rehabilitative and Restorative Service Providers"

## 2014-04-22 DIAGNOSIS — R42 Dizziness and giddiness: Secondary | ICD-10-CM
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
## 2014-04-26 ENCOUNTER — Encounter: Payer: Self-pay | Admitting: Rehabilitative and Restorative Service Providers"

## 2014-04-29 ENCOUNTER — Ambulatory Visit: Payer: No Typology Code available for payment source | Admitting: Rehabilitative and Restorative Service Providers"

## 2014-05-06 ENCOUNTER — Encounter: Payer: Self-pay | Admitting: *Deleted

## 2014-05-09 ENCOUNTER — Ambulatory Visit: Payer: No Typology Code available for payment source | Attending: Internal Medicine

## 2014-05-09 VITALS — BP 144/89 | HR 88

## 2014-05-09 DIAGNOSIS — R42 Dizziness and giddiness: Secondary | ICD-10-CM | POA: Insufficient documentation

## 2014-05-09 NOTE — Patient Instructions (Addendum)
Strengthening: Chest Pull - Resisted   With resistive band looped around each hand, and arms straight out in front, stretch band across chest.  Do this lying down with head on pillow Repeat _10___ times per set. Do __1__ sets per session. Do __1__ sessions per day.  http://orth.exer.us/926   Copyright  VHI. All rights reserved.  Flexibility: Corner Stretch   Standing in corner with hands just above shoulder level and feet ____ inches from corner, lean forward until a comfortable stretch is felt across chest. Hold __10-20__ seconds. Repeat __3__ times per set. Do 1 sets per session. Do __2__ sessions per day.  http://orth.exer.us/343   Copyright  VHI. All rights reserved.  Resisted External Rotation: in Neutral - Bilateral   Lying on back with head on pillow, tubing in both hands, elbows at sides, bent to 90, forearms forward. Pinch shoulder blades together and rotate forearms out. Keep elbows at sides. Repeat _10___ times per set. Do __1__ sets per session. Do __2__ sessions per day.  http://orth.exer.us/967   Copyright  VHI. All rights reserved.
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
## 2014-05-17 ENCOUNTER — Ambulatory Visit: Payer: No Typology Code available for payment source | Admitting: Rehabilitative and Restorative Service Providers"

## 2014-06-07 ENCOUNTER — Ambulatory Visit
Payer: No Typology Code available for payment source | Attending: Internal Medicine | Admitting: Rehabilitative and Restorative Service Providers"

## 2014-06-07 ENCOUNTER — Encounter: Payer: Self-pay | Admitting: Rehabilitative and Restorative Service Providers"

## 2014-06-07 DIAGNOSIS — R42 Dizziness and giddiness: Secondary | ICD-10-CM | POA: Insufficient documentation
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
## 2014-06-07 NOTE — Therapy (Signed)
[facility] [address] [place], [place], [address] Phone: [phone]   Fax:  [phone]  Patient Details  Name: Abigail Butler MRN: [id number] Date of Birth: November 29, 1965 Referring Provider:  No ref. provider found  Encounter Date: 06/07/2014  PHYSICAL THERAPY DISCHARGE SUMMARY  Visits from Start of Care: 5   Current functional level related to goals / functional outcomes:     PT Short Term Goals - 05/09/14 1544    PT SHORT TERM GOAL #1   Title The patient will perform HEP with pictures for reference.  Target date 05/03/2014.   Baseline met on 04/18/2014   Time 4   Period Weeks   Status Achieved   PT SHORT TERM GOAL #2   Title The patient will tolereate seated horizontal head turns x 5 reps without holding her head to demo improved tolerance to movement.  Target date 05/03/2014.   Baseline met on 04/18/2014   Time 4   Period Weeks   Status Achieved   PT SHORT TERM GOAL #3   Title The patient will tolerate sit>sidelying to the R side with 1 pillow with dizziness reported as "Mild".  Target date 05/03/2014.   Baseline met on 04/22/2014   Time 4   Period Weeks   Status Achieved   PT SHORT TERM GOAL #4   Title The patient will verbalize understanding of home walking program to improve tolerance to movement.  Target date 05/03/2014.   Baseline Met on 04/18/2014   Time 4   Period Weeks   Status Achieved         PT Long Term Goals - 06/07/14 1511    PT LONG TERM GOAL #1   Title The patient will be indep with HEP for post d/c progression.  Target date 05/17/2014.   Baseline Met on 06/07/2014   Time 6   Period Weeks   Status Achieved   PT LONG TERM GOAL #2   Title The patient will subjectively improve on dizziness handicap index to < or equal to 50% (from 70%) to demo decreased subjective perception of dizziness.  Target date 05/17/2014.   Baseline Not retested due to language barrier (pt's daughter completed it first day)   Time 6   Period Weeks   Status Deferred   PT LONG TERM GOAL #3   Title The patient will perform rolling L<>R without subjective reports of dizziness.  Target date 05/17/2014.   Baseline met on 04/22/2014   Time 6   Period Weeks   Status Achieved        Remaining deficits: Head pressure, nasal congestion, intermittent imbalance and dizziness.   Education / Equipment: HEP for dizziness, gaze adaptation, walking program, and postural stretching.  Plan: Patient agrees to discharge.  Patient goals were partially met. Patient is being discharged due to meeting the stated rehab goals.  ?????   Thank you for the referral of this patient.    [place], [place] 06/07/2014, 3:54 PM  [facility] [address] [place], [place], [address] Phone: [phone]   Fax:  [phone]

## 2014-06-19 ENCOUNTER — Ambulatory Visit: Payer: No Typology Code available for payment source

## 2014-07-10 ENCOUNTER — Encounter: Payer: Self-pay | Admitting: *Deleted

## 2014-07-12 ENCOUNTER — Encounter: Payer: Self-pay | Admitting: Internal Medicine

## 2014-07-12 ENCOUNTER — Ambulatory Visit (INDEPENDENT_AMBULATORY_CARE_PROVIDER_SITE_OTHER): Payer: Self-pay | Admitting: Internal Medicine

## 2014-07-12 VITALS — BP 108/65 | HR 78 | Temp 98.3°F | Ht 65.0 in | Wt 188.1 lb

## 2014-07-12 DIAGNOSIS — R1013 Epigastric pain: Secondary | ICD-10-CM | POA: Insufficient documentation

## 2014-07-12 MED ORDER — CLARITHROMYCIN 500 MG PO TABS: 500.0000 mg | ORAL_TABLET | Freq: Two times a day (BID) | ORAL | Status: AC

## 2014-07-12 MED ORDER — PANTOPRAZOLE SODIUM 40 MG PO TBEC: 40.0000 mg | DELAYED_RELEASE_TABLET | Freq: Two times a day (BID) | ORAL | Status: DC

## 2014-07-12 MED ORDER — AMOXICILLIN 500 MG PO TABS: 1000.0000 mg | ORAL_TABLET | Freq: Two times a day (BID) | ORAL | Status: DC
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
## 2014-07-12 NOTE — Patient Instructions (Signed)
General Instructions:   Please try to bring all your medicines next time. This will help us keep you safe from mistakes.     Progress Toward Treatment Goals:  Treatment Goal 12/24/2013  Prevent falls at goal    Self Care Goals & Plans:  Self Care Goal 11/02/2013  Manage my medications take my medicines as prescribed; bring my medications to every visit; refill my medications on time  Eat healthy foods drink diet soda or water instead of juice or soda; eat more vegetables; eat foods that are low in salt; eat baked foods instead of fried foods; eat fruit for snacks and desserts    No flowsheet data found.   Care Management & Community Referrals:  No flowsheet data found.

## 2014-07-12 NOTE — Assessment & Plan Note (Signed)
Patient describes some GERD-like symptoms have gotten worse recently after stopping her omeprazole. The patient is originally from [place] and does not appear to have ever been tested for H. pylori infection. There are no red flag symptoms to suggest urgent need for EGD. The patient has maintained normal weight, normal hemoglobin, and vital signs are stable today. Wt Readings from Last 3 Encounters:  07/12/14 188 lb 1.6 oz (85.322 kg)  02/11/14 183 lb 9.6 oz (83.28 kg)  12/24/13 185 lb 8 oz (84.142 kg)   Lab Results  Component Value Date   HGB 12.6 12/23/2013   -Will empirically treat for H. pylori with clarithromycin 500 mg twice a day, amoxicillin 1000 mg twice a day, and Protonix 40 mg twice a day for 14 days -Patient was educated on any red flag symptoms that would require prompt evaluation -The patient is to follow-up in one month -Follow-up could consider totally discontinuing PPI as if dyspepsia is related to H. pylori should resolve with treatment of infection, or continue lower dose PPI or consider H2 blocker along or consider secondary course of H. pylori treatment

## 2014-07-13 NOTE — Progress Notes (Signed)
Internal Medicine Clinic Attending  Case discussed with Dr. Sadek soon after the resident saw the patient.  We reviewed the resident's history and exam and pertinent patient test results.  I agree with the assessment, diagnosis, and plan of care documented in the resident's note. 

## 2014-10-15 ENCOUNTER — Ambulatory Visit (INDEPENDENT_AMBULATORY_CARE_PROVIDER_SITE_OTHER): Payer: Self-pay | Admitting: Internal Medicine

## 2014-10-15 ENCOUNTER — Encounter: Payer: Self-pay | Admitting: Internal Medicine

## 2014-10-15 DIAGNOSIS — M5116 Intervertebral disc disorders with radiculopathy, lumbar region: Secondary | ICD-10-CM

## 2014-10-15 DIAGNOSIS — M25552 Pain in left hip: Secondary | ICD-10-CM | POA: Insufficient documentation

## 2014-10-15 DIAGNOSIS — R7303 Prediabetes: Secondary | ICD-10-CM | POA: Insufficient documentation

## 2014-10-15 DIAGNOSIS — R7309 Other abnormal glucose: Secondary | ICD-10-CM

## 2014-10-15 DIAGNOSIS — Z0000 Encounter for general adult medical examination without abnormal findings: Secondary | ICD-10-CM

## 2014-10-15 HISTORY — DX: Prediabetes: R73.03

## 2014-10-15 HISTORY — DX: Intervertebral disc disorders with radiculopathy, lumbar region: M51.16

## 2014-10-15 LAB — POCT URINALYSIS DIPSTICK
Bilirubin, UA: NEGATIVE
Glucose, UA: NEGATIVE
Ketones, UA: NEGATIVE
NITRITE UA: NEGATIVE
PH UA: 5.5
PROTEIN UA: NEGATIVE
SPEC GRAV UA: 1.02
UROBILINOGEN UA: 0.2

## 2014-10-15 LAB — POCT GLYCOSYLATED HEMOGLOBIN (HGB A1C): HEMOGLOBIN A1C: 5.7

## 2014-10-15 LAB — GLUCOSE, CAPILLARY: Glucose-Capillary: 83 mg/dL (ref 65–99)

## 2014-10-15 MED ORDER — IBUPROFEN 800 MG PO TABS: 800.0000 mg | ORAL_TABLET | Freq: Three times a day (TID) | ORAL | Status: DC | PRN

## 2014-10-15 MED ORDER — GABAPENTIN 300 MG PO CAPS: 300.0000 mg | ORAL_CAPSULE | Freq: Three times a day (TID) | ORAL | Status: DC

## 2014-10-15 NOTE — Progress Notes (Signed)
Case discussed with Dr. Rabbani soon after the resident saw the patient.  We reviewed the resident's history and exam and pertinent patient test results.  I agree with the assessment, diagnosis, and plan of care documented in the resident's note. 

## 2014-10-15 NOTE — Patient Instructions (Signed)
-Start taking ibuprofen 800 mg every 8 hrs as needed for pain  -Start taking gabapentin 300 mg daily (take 1 pill the first day then 2 pills the 2nd day and then 3 pills after that) -Will refer you to sports medicine for your back and hip pain  -Will check your bloodwork today and call you with the results  -Very nice meeting you, please come back in 4-6 months     Radicular Pain Radicular pain in either the arm or leg is usually from a bulging or herniated disk in the spine. A piece of the herniated disk may press against the nerves as the nerves exit the spine. This causes pain which is felt at the tips of the nerves down the arm or leg. Other causes of radicular pain may include:  Fractures.  Heart disease.  Cancer.  An abnormal and usually degenerative state of the nervous system or nerves (neuropathy). Diagnosis may require CT or MRI scanning to determine the primary cause.  Nerves that start at the neck (nerve roots) may cause radicular pain in the outer shoulder and arm. It can spread down to the thumb and fingers. The symptoms vary depending on which nerve root has been affected. In most cases radicular pain improves with conservative treatment. Neck problems may require physical therapy, a neck collar, or cervical traction. Treatment may take many weeks, and surgery may be considered if the symptoms do not improve.  Conservative treatment is also recommended for sciatica. Sciatica causes pain to radiate from the lower back or buttock area down the leg into the foot. Often there is a history of back problems. Most patients with sciatica are better after 2 to 4 weeks of rest and other supportive care. Short term bed rest can reduce the disk pressure considerably. Sitting, however, is not a good position since this increases the pressure on the disk. You should avoid bending, lifting, and all other activities which make the problem worse. Traction can be used in severe cases. Surgery is  usually reserved for patients who do not improve within the first months of treatment. Only take over-the-counter or prescription medicines for pain, discomfort, or fever as directed by your caregiver. Narcotics and muscle relaxants may help by relieving more severe pain and spasm and by providing mild sedation. Cold or massage can give significant relief. Spinal manipulation is not recommended. It can increase the degree of disc protrusion. Epidural steroid injections are often effective treatment for radicular pain. These injections deliver medicine to the spinal nerve in the space between the protective covering of the spinal cord and back bones (vertebrae). Your caregiver can give you more information about steroid injections. These injections are most effective when given within two weeks of the onset of pain.  You should see your caregiver for follow up care as recommended. A program for neck and back injury rehabilitation with stretching and strengthening exercises is an important part of management.  SEEK IMMEDIATE MEDICAL CARE IF:  You develop increased pain, weakness, or numbness in your arm or leg.  You develop difficulty with bladder or bowel control.  You develop abdominal pain. Document Released: 03/25/2004 Document Revised: 05/10/2011 Document Reviewed: 06/10/2008 [facility] Patient Information 2015 [place], [place]. This information is not intended to replace advice given to you by your health care provider. Make sure you discuss any questions you have with your health care provider.     Hip Pain Your hip is the joint between your upper legs and your lower pelvis.  The bones, cartilage, tendons, and muscles of your hip joint perform a lot of work each day supporting your body weight and allowing you to move around. Hip pain can range from a minor ache to severe pain in one or both of your hips. Pain may be felt on the inside of the hip joint near the groin, or the outside near the  buttocks and upper thigh. You may have swelling or stiffness as well.  HOME CARE INSTRUCTIONS   Take medicines only as directed by your health care provider.  Apply ice to the injured area:  Put ice in a plastic bag.  Place a towel between your skin and the bag.  Leave the ice on for 15-20 minutes at a time, 3-4 times a day.  Keep your leg raised (elevated) when possible to lessen swelling.  Avoid activities that cause pain.  Follow specific exercises as directed by your health care provider.  Sleep with a pillow between your legs on your most comfortable side.  Record how often you have hip pain, the location of the pain, and what it feels like. SEEK MEDICAL CARE IF:   You are unable to put weight on your leg.  Your hip is red or swollen or very tender to touch.  Your pain or swelling continues or worsens after 1 week.  You have increasing difficulty walking.  You have a fever. SEEK IMMEDIATE MEDICAL CARE IF:   You have fallen.  You have a sudden increase in pain and swelling in your hip. MAKE SURE YOU:   Understand these instructions.  Will watch your condition.  Will get help right away if you are not doing well or get worse. Document Released: 08/05/2009 Document Revised: 07/02/2013 Document Reviewed: 10/12/2012 [facility] Patient Information 2015 [place], [place]. This information is not intended to replace advice given to you by your health care provider. Make sure you discuss any questions you have with your health care provider.

## 2014-10-15 NOTE — Assessment & Plan Note (Signed)
Assessment: Pt with chronic low back pain after fall in 2013 with mild DDD and degenerative facet disease at L5-S1 on lumbar xray imaging on 09/28/13 who presents with left sided sciatica with no alarm symptoms.   Plan: -Obtain CMP, CBC w/diff, and ESR -Prescribe ibuprofen 800 mg Q 8 hr PRN pain/inflammation -Prescribe gabapentin 300 mg TID for neuropathy  -Refer to sports medicine for further management and treatment including possible corticosteroid injection, imaging, and physical therapy

## 2014-10-15 NOTE — Assessment & Plan Note (Addendum)
-  Obtain screening HIV Ab -Inquire about screening colonoscopy and tdap vaccination at next visit -Obtain screening 25-OH vitamin D level at next visit

## 2014-10-15 NOTE — Assessment & Plan Note (Addendum)
Assessment: Pt with last A1c of 5.6 on 09/28/13 who presents with CBG of 83 and new-onset prediabetes with A1c of 5.7.   Plan:  -Obtain CBG ---> 83 -Obtain A1c ---> 5.7 -Obtain POC UA ---> no glucosuria or proteinuria, mild hematuria, repeat at next visit -Pt counseled on lifestyle modification
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
## 2014-10-15 NOTE — Assessment & Plan Note (Addendum)
Assessment: Pt with chronic low back pain in setting of DDD and left sided sciatica who presents with 79-month history of left sided lateral hip pain with radiation to groin of unclear etiology, possibly due to sciatica vs trochanteric bursitis vs OA.  Plan:  -Prescribe ibuprofen 800 mg Q 8 hr PRN pain/inflammation -Refer to sports medicine for further management and treatment including possible corticosteroid injection, imaging, and physical therapy

## 2014-10-16 LAB — CBC WITH DIFFERENTIAL/PLATELET
Basophils Absolute: 0 10*3/uL (ref 0.0–0.2)
Basos: 0 %
EOS (ABSOLUTE): 0.3 10*3/uL (ref 0.0–0.4)
Eos: 4 %
Hematocrit: 38.1 % (ref 34.0–46.6)
Hemoglobin: 12.6 g/dL (ref 11.1–15.9)
IMMATURE GRANULOCYTES: 0 %
Immature Grans (Abs): 0 10*3/uL (ref 0.0–0.1)
Lymphocytes Absolute: 3.5 10*3/uL — ABNORMAL HIGH (ref 0.7–3.1)
Lymphs: 45 %
MCH: 28.1 pg (ref 26.6–33.0)
MCHC: 33.1 g/dL (ref 31.5–35.7)
MCV: 85 fL (ref 79–97)
MONOCYTES: 7 %
MONOS ABS: 0.6 10*3/uL (ref 0.1–0.9)
NEUTROS PCT: 44 %
Neutrophils Absolute: 3.4 10*3/uL (ref 1.4–7.0)
Platelets: 283 10*3/uL (ref 150–379)
RBC: 4.49 x10E6/uL (ref 3.77–5.28)
RDW: 13.4 % (ref 12.3–15.4)
WBC: 7.8 10*3/uL (ref 3.4–10.8)

## 2014-10-16 LAB — COMPREHENSIVE METABOLIC PANEL
ALT: 10 IU/L (ref 0–32)
AST: 12 IU/L (ref 0–40)
Albumin/Globulin Ratio: 1.4 (ref 1.1–2.5)
Albumin: 4.4 g/dL (ref 3.5–5.5)
Alkaline Phosphatase: 77 IU/L (ref 39–117)
BUN / CREAT RATIO: 21 (ref 9–23)
BUN: 13 mg/dL (ref 6–24)
CHLORIDE: 101 mmol/L (ref 97–108)
CO2: 24 mmol/L (ref 18–29)
CREATININE: 0.63 mg/dL (ref 0.57–1.00)
Calcium: 9 mg/dL (ref 8.7–10.2)
GFR calc Af Amer: 121 mL/min/{1.73_m2} (ref >59)
GFR calc non Af Amer: 105 mL/min/{1.73_m2} (ref >59)
GLUCOSE: 93 mg/dL (ref 65–99)
Globulin, Total: 3.1 g/dL (ref 1.5–4.5)
Potassium: 4.2 mmol/L (ref 3.5–5.2)
SODIUM: 141 mmol/L (ref 134–144)
Total Protein: 7.5 g/dL (ref 6.0–8.5)

## 2014-10-16 LAB — HIV ANTIBODY (ROUTINE TESTING W REFLEX): HIV SCREEN 4TH GENERATION: NONREACTIVE

## 2014-10-16 LAB — SEDIMENTATION RATE: Sed Rate: 19 mm/hr (ref 0–40)

## 2014-10-21 ENCOUNTER — Ambulatory Visit (INDEPENDENT_AMBULATORY_CARE_PROVIDER_SITE_OTHER): Payer: No Typology Code available for payment source | Admitting: Family Medicine

## 2014-10-21 ENCOUNTER — Encounter: Payer: Self-pay | Admitting: Family Medicine

## 2014-10-21 VITALS — BP 112/73 | HR 80 | Ht 63.0 in | Wt 187.0 lb

## 2014-10-21 DIAGNOSIS — M5116 Intervertebral disc disorders with radiculopathy, lumbar region: Secondary | ICD-10-CM

## 2014-10-21 MED ORDER — PREDNISONE 10 MG PO TABS: ORAL_TABLET | ORAL | Status: DC

## 2014-10-21 NOTE — Patient Instructions (Signed)
You have lumbar radiculopathy (a pinched nerve in your low back). Take tylenol for baseline pain relief (1-2 extra strength tabs 3x/day) A prednisone dose pack is the best option for immediate relief and may be prescribed. Day after finishing prednisone start aleve 2 tabs twice a day with food for pain and inflammation. Stay as active as possible. Physical therapy has been shown to be helpful as well - start this. Strengthening of low back muscles, abdominal musculature are key for long term pain relief. If not improving, will consider further imaging (MRI).
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
## 2014-10-23 NOTE — Assessment & Plan Note (Signed)
describes symptoms into left leg though some exaggeration of symptoms on exam.  Suspect radiculopathy from discogenic cause.  Start with prednisone, physical therapy and home exercises.  F/u in 1 month to 6 weeks.  Consider MRI if not improving.

## 2014-11-21 ENCOUNTER — Ambulatory Visit (INDEPENDENT_AMBULATORY_CARE_PROVIDER_SITE_OTHER): Payer: No Typology Code available for payment source | Admitting: Family Medicine

## 2014-11-21 ENCOUNTER — Encounter: Payer: Self-pay | Admitting: Family Medicine

## 2014-11-21 VITALS — BP 125/84 | HR 73 | Ht 65.0 in | Wt 180.0 lb

## 2014-11-21 DIAGNOSIS — M5116 Intervertebral disc disorders with radiculopathy, lumbar region: Secondary | ICD-10-CM

## 2014-11-21 NOTE — Patient Instructions (Signed)
We will go ahead with an MRI of your lumbar spine - follow up with me 1-2 days after you have this done so we can go over the results in detail and talk about next steps.

## 2014-11-23 ENCOUNTER — Ambulatory Visit (HOSPITAL_BASED_OUTPATIENT_CLINIC_OR_DEPARTMENT_OTHER)
Admission: RE | Admit: 2014-11-23 | Discharge: 2014-11-23 | Disposition: A | Discharge status: Home / Self Care | Payer: Self-pay | Source: Ambulatory Visit | Attending: Family Medicine | Admitting: Family Medicine

## 2014-11-23 DIAGNOSIS — M5116 Intervertebral disc disorders with radiculopathy, lumbar region: Secondary | ICD-10-CM

## 2014-11-23 DIAGNOSIS — M5137 Other intervertebral disc degeneration, lumbosacral region: Secondary | ICD-10-CM | POA: Insufficient documentation

## 2014-11-23 DIAGNOSIS — M545 Low back pain: Secondary | ICD-10-CM | POA: Insufficient documentation

## 2014-11-23 DIAGNOSIS — M4807 Spinal stenosis, lumbosacral region: Secondary | ICD-10-CM | POA: Insufficient documentation

## 2014-11-23 DIAGNOSIS — M4806 Spinal stenosis, lumbar region: Secondary | ICD-10-CM | POA: Insufficient documentation

## 2014-11-26 ENCOUNTER — Encounter: Payer: No Typology Code available for payment source | Admitting: Family Medicine
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
## 2014-11-27 NOTE — Progress Notes (Signed)
This encounter was created in error - please disregard.

## 2014-12-02 ENCOUNTER — Other Ambulatory Visit: Payer: Self-pay

## 2014-12-02 DIAGNOSIS — Z1231 Encounter for screening mammogram for malignant neoplasm of breast: Secondary | ICD-10-CM

## 2014-12-03 ENCOUNTER — Ambulatory Visit: Payer: No Typology Code available for payment source | Admitting: Physical Therapy

## 2014-12-10 ENCOUNTER — Ambulatory Visit: Payer: No Typology Code available for payment source | Attending: Family Medicine | Admitting: Physical Therapy

## 2014-12-10 VITALS — BP 140/78

## 2014-12-10 DIAGNOSIS — M25552 Pain in left hip: Secondary | ICD-10-CM | POA: Insufficient documentation

## 2014-12-10 DIAGNOSIS — R262 Difficulty in walking, not elsewhere classified: Secondary | ICD-10-CM

## 2014-12-10 DIAGNOSIS — M5442 Lumbago with sciatica, left side: Secondary | ICD-10-CM
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
## 2014-12-13 ENCOUNTER — Ambulatory Visit
Admission: RE | Admit: 2014-12-13 | Discharge: 2014-12-13 | Disposition: A | Discharge status: Home / Self Care | Payer: No Typology Code available for payment source | Source: Ambulatory Visit

## 2014-12-13 ENCOUNTER — Ambulatory Visit: Payer: No Typology Code available for payment source | Admitting: Physical Therapy

## 2014-12-13 DIAGNOSIS — R262 Difficulty in walking, not elsewhere classified: Secondary | ICD-10-CM

## 2014-12-13 DIAGNOSIS — M25552 Pain in left hip: Secondary | ICD-10-CM

## 2014-12-13 DIAGNOSIS — M5442 Lumbago with sciatica, left side: Secondary | ICD-10-CM

## 2014-12-13 DIAGNOSIS — Z1231 Encounter for screening mammogram for malignant neoplasm of breast: Secondary | ICD-10-CM
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
## 2014-12-16 ENCOUNTER — Other Ambulatory Visit: Payer: Self-pay | Admitting: Internal Medicine

## 2014-12-16 DIAGNOSIS — N6320 Unspecified lump in the left breast, unspecified quadrant: Secondary | ICD-10-CM

## 2014-12-17 ENCOUNTER — Ambulatory Visit: Payer: Self-pay

## 2014-12-17 ENCOUNTER — Ambulatory Visit: Payer: No Typology Code available for payment source

## 2014-12-17 ENCOUNTER — Other Ambulatory Visit: Payer: Self-pay | Admitting: Internal Medicine

## 2014-12-17 DIAGNOSIS — R928 Other abnormal and inconclusive findings on diagnostic imaging of breast: Secondary | ICD-10-CM

## 2014-12-19 ENCOUNTER — Ambulatory Visit: Payer: No Typology Code available for payment source

## 2014-12-24 ENCOUNTER — Ambulatory Visit: Payer: Self-pay

## 2014-12-24 ENCOUNTER — Other Ambulatory Visit: Payer: Self-pay

## 2014-12-27 ENCOUNTER — Emergency Department (HOSPITAL_COMMUNITY): Payer: Self-pay

## 2014-12-27 ENCOUNTER — Emergency Department (HOSPITAL_COMMUNITY): Payer: No Typology Code available for payment source

## 2014-12-27 ENCOUNTER — Emergency Department (HOSPITAL_COMMUNITY)
Admission: EM | Admit: 2014-12-27 | Discharge: 2014-12-27 | Disposition: A | Discharge status: Home / Self Care | Payer: Self-pay | Attending: Emergency Medicine | Admitting: Emergency Medicine

## 2014-12-27 ENCOUNTER — Ambulatory Visit
Admission: RE | Admit: 2014-12-27 | Discharge: 2014-12-27 | Disposition: A | Discharge status: Home / Self Care | Payer: No Typology Code available for payment source | Source: Ambulatory Visit | Attending: Internal Medicine | Admitting: Internal Medicine

## 2014-12-27 ENCOUNTER — Encounter (HOSPITAL_COMMUNITY): Payer: Self-pay | Admitting: Emergency Medicine

## 2014-12-27 ENCOUNTER — Ambulatory Visit: Payer: Self-pay | Admitting: Physical Therapy

## 2014-12-27 DIAGNOSIS — Z9181 History of falling: Secondary | ICD-10-CM | POA: Insufficient documentation

## 2014-12-27 DIAGNOSIS — R928 Other abnormal and inconclusive findings on diagnostic imaging of breast: Secondary | ICD-10-CM

## 2014-12-27 DIAGNOSIS — R110 Nausea: Secondary | ICD-10-CM | POA: Insufficient documentation

## 2014-12-27 DIAGNOSIS — R079 Chest pain, unspecified: Secondary | ICD-10-CM | POA: Insufficient documentation

## 2014-12-27 DIAGNOSIS — R05 Cough: Secondary | ICD-10-CM | POA: Insufficient documentation

## 2014-12-27 DIAGNOSIS — Z87828 Personal history of other (healed) physical injury and trauma: Secondary | ICD-10-CM | POA: Insufficient documentation

## 2014-12-27 LAB — CBC
HEMATOCRIT: 37.1 % (ref 36.0–46.0)
HEMOGLOBIN: 12.2 g/dL (ref 12.0–15.0)
MCH: 27.9 pg (ref 26.0–34.0)
MCHC: 32.9 g/dL (ref 30.0–36.0)
MCV: 84.9 fL (ref 78.0–100.0)
Platelets: 217 10*3/uL (ref 150–400)
RBC: 4.37 MIL/uL (ref 3.87–5.11)
RDW: 12.4 % (ref 11.5–15.5)
WBC: 7.3 10*3/uL (ref 4.0–10.5)

## 2014-12-27 LAB — COMPREHENSIVE METABOLIC PANEL
ALBUMIN: 3.5 g/dL (ref 3.5–5.0)
ALK PHOS: 63 U/L (ref 38–126)
ALT: 19 U/L (ref 14–54)
AST: 19 U/L (ref 15–41)
Anion gap: 8 (ref 5–15)
BILIRUBIN TOTAL: 0.1 mg/dL — AB (ref 0.3–1.2)
BUN: 11 mg/dL (ref 6–20)
CALCIUM: 9 mg/dL (ref 8.9–10.3)
CO2: 23 mmol/L (ref 22–32)
CREATININE: 0.6 mg/dL (ref 0.44–1.00)
Chloride: 108 mmol/L (ref 101–111)
GFR calc Af Amer: >60 mL/min (ref >60)
GFR calc non Af Amer: >60 mL/min (ref >60)
GLUCOSE: 94 mg/dL (ref 65–99)
Potassium: 4 mmol/L (ref 3.5–5.1)
SODIUM: 139 mmol/L (ref 135–145)
TOTAL PROTEIN: 6.9 g/dL (ref 6.5–8.1)

## 2014-12-27 LAB — TROPONIN I
Troponin I: <0.03 ng/mL (ref <0.031)
Troponin I: <0.03 ng/mL (ref <0.031)

## 2014-12-27 LAB — I-STAT TROPONIN, ED: Troponin i, poc: 0 ng/mL (ref 0.00–0.08)

## 2014-12-27 LAB — D-DIMER, QUANTITATIVE: D-Dimer, Quant: 0.53 ug/mL-FEU — ABNORMAL HIGH (ref 0.00–0.48)

## 2014-12-27 MED ORDER — NITROGLYCERIN 0.4 MG SL SUBL: 0.4000 mg | SUBLINGUAL_TABLET | SUBLINGUAL | Status: DC | PRN

## 2014-12-27 MED ORDER — SODIUM CHLORIDE 0.9 % IV BOLUS (SEPSIS)
1000.0000 mL | Freq: Once | INTRAVENOUS | Status: AC
Start: 2014-12-27 — End: 2014-12-27
  Administered 2014-12-27: 1000 mL via INTRAVENOUS

## 2014-12-27 MED ORDER — FENTANYL CITRATE (PF) 100 MCG/2ML IJ SOLN
100.0000 ug | Freq: Once | INTRAMUSCULAR | Status: AC
  Administered 2014-12-27: 100 ug via INTRAVENOUS
  Filled 2014-12-27: qty 2

## 2014-12-27 MED ORDER — IOHEXOL 350 MG/ML SOLN: 80.0000 mL | Freq: Once | INTRAVENOUS | Status: DC | PRN

## 2014-12-27 MED ORDER — ASPIRIN 81 MG PO CHEW: 324.0000 mg | CHEWABLE_TABLET | Freq: Once | ORAL | Status: DC

## 2014-12-27 NOTE — ED Notes (Signed)
Patient transported to CT via stretcher.

## 2014-12-27 NOTE — ED Notes (Addendum)
Patient returned to room from CT. Daughter assisted patient to restroom. No difficulty noted.

## 2014-12-27 NOTE — ED Notes (Signed)
Discharge instructions reviewed with patient/family. Understanding verbalized. No acute distress noted.
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
## 2014-12-27 NOTE — ED Notes (Signed)
Phlebotomy at bedside.

## 2014-12-27 NOTE — ED Notes (Signed)
Pt here via EMS from appointment to have mammogram. Pt was anxious before appt per EMS- Pt is reporting sudden onset chest tightness and burning in her left side starting at the appt. Pt denies n/v, reports diaphoresis. Pt also reports her "troat closing." Pt is a/o x 4, interpreter phone used at triage.

## 2014-12-28 MED ORDER — IOHEXOL 350 MG/ML SOLN
80.0000 mL | Freq: Once | INTRAVENOUS | Status: AC | PRN
  Administered 2014-12-27: 80 mL via INTRAVENOUS

## 2014-12-31 ENCOUNTER — Ambulatory Visit: Payer: Self-pay | Admitting: Physical Therapy

## 2015-01-07 ENCOUNTER — Ambulatory Visit: Payer: Self-pay

## 2015-01-10 ENCOUNTER — Ambulatory Visit: Payer: Self-pay | Admitting: Physical Therapy

## 2015-01-14 ENCOUNTER — Ambulatory Visit: Payer: Self-pay

## 2015-01-17 ENCOUNTER — Ambulatory Visit: Payer: Self-pay | Admitting: Physical Therapy

## 2015-01-21 ENCOUNTER — Ambulatory Visit: Payer: Self-pay | Admitting: Physical Therapy

## 2015-01-27 ENCOUNTER — Encounter: Payer: No Typology Code available for payment source | Admitting: Cardiovascular Disease

## 2015-01-27 DIAGNOSIS — R0989 Other specified symptoms and signs involving the circulatory and respiratory systems: Secondary | ICD-10-CM

## 2015-01-28 ENCOUNTER — Ambulatory Visit: Payer: No Typology Code available for payment source | Attending: Family Medicine | Admitting: Physical Therapy

## 2015-01-28 DIAGNOSIS — M25552 Pain in left hip: Secondary | ICD-10-CM

## 2015-01-28 DIAGNOSIS — R262 Difficulty in walking, not elsewhere classified: Secondary | ICD-10-CM

## 2015-01-28 DIAGNOSIS — M5442 Lumbago with sciatica, left side: Secondary | ICD-10-CM
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
## 2015-01-31 ENCOUNTER — Ambulatory Visit: Payer: Self-pay | Admitting: Physical Therapy

## 2015-02-14 ENCOUNTER — Ambulatory Visit: Payer: Self-pay | Admitting: Physical Therapy

## 2015-03-31 ENCOUNTER — Ambulatory Visit: Payer: Self-pay | Admitting: Pulmonary Disease

## 2015-04-03 ENCOUNTER — Encounter: Payer: Self-pay | Admitting: Internal Medicine

## 2015-04-03 ENCOUNTER — Ambulatory Visit (INDEPENDENT_AMBULATORY_CARE_PROVIDER_SITE_OTHER): Payer: Self-pay | Admitting: Internal Medicine

## 2015-04-03 VITALS — BP 131/52 | HR 69 | Temp 97.9°F | Ht 65.0 in | Wt 182.9 lb

## 2015-04-03 DIAGNOSIS — R1013 Epigastric pain: Secondary | ICD-10-CM

## 2015-04-03 DIAGNOSIS — A048 Other specified bacterial intestinal infections: Secondary | ICD-10-CM | POA: Insufficient documentation

## 2015-04-03 MED ORDER — PANTOPRAZOLE SODIUM 40 MG PO TBEC: 40.0000 mg | DELAYED_RELEASE_TABLET | Freq: Every day | ORAL | Status: DC

## 2015-04-03 NOTE — Patient Instructions (Signed)
1. Please return in 2 weeks for follow up.   2. Please take all medications as previously prescribed with the following changes:  Start taking Protonix 40 mg daily for epigastric pain.   3. If you have worsening of your symptoms or new symptoms arise, please call the clinic ([phone]), or go to the ER immediately if symptoms are severe.

## 2015-04-03 NOTE — Assessment & Plan Note (Signed)
Patient with very difficult to describe epigastric pain that is a "knot" type pain, burning, extending into her chest, worse with lying flat. Patient has been on PPI in the past and the pain has gotten worse when this medication was stopped. No nausea, vomiting, fever, chills, diarrhea, or change in bowel habits. Very strange exam, has very superficial tenderness in almost every location on her body, not simply her epigastrium. Unable to pinpoint a specific target organ system given her exam, however, feel that most of her issues stem from GERD. Has previously been empirically treated for H. Pylori as well. Patient has very low pre-test probability for cardiac chest pain, especially given her physical exam and exquisite point tenderness over the chest and also not limited to this area. Do not think her findings warrant a stress test. Patient likely has psychogenic type pain which encompasses her her strange exam.  -Start back PPI; protonix 40 mg daily -Check basic labs; CMP, CBC -RTC in 2 weeks
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
## 2015-04-04 LAB — CBC WITH DIFFERENTIAL/PLATELET
BASOS ABS: 0.1 10*3/uL (ref 0.0–0.2)
BASOS: 1 %
EOS (ABSOLUTE): 0.3 10*3/uL (ref 0.0–0.4)
Eos: 4 %
Hematocrit: 37.6 % (ref 34.0–46.6)
Hemoglobin: 12.5 g/dL (ref 11.1–15.9)
Immature Grans (Abs): 0 10*3/uL (ref 0.0–0.1)
Immature Granulocytes: 0 %
LYMPHS ABS: 3.5 10*3/uL — AB (ref 0.7–3.1)
Lymphs: 50 %
MCH: 28 pg (ref 26.6–33.0)
MCHC: 33.2 g/dL (ref 31.5–35.7)
MCV: 84 fL (ref 79–97)
MONOS ABS: 0.5 10*3/uL (ref 0.1–0.9)
Monocytes: 7 %
NEUTROS ABS: 2.7 10*3/uL (ref 1.4–7.0)
Neutrophils: 38 %
PLATELETS: 258 10*3/uL (ref 150–379)
RBC: 4.47 x10E6/uL (ref 3.77–5.28)
RDW: 13.5 % (ref 12.3–15.4)
WBC: 7.1 10*3/uL (ref 3.4–10.8)

## 2015-04-04 LAB — CMP14 + ANION GAP
A/G RATIO: 1.3 (ref 1.1–2.5)
ALT: 13 IU/L (ref 0–32)
AST: 14 IU/L (ref 0–40)
Albumin: 4.2 g/dL (ref 3.5–5.5)
Alkaline Phosphatase: 90 IU/L (ref 39–117)
Anion Gap: 18 mmol/L (ref 10.0–18.0)
BILIRUBIN TOTAL: 0.2 mg/dL (ref 0.0–1.2)
BUN/Creatinine Ratio: 17 (ref 9–23)
BUN: 10 mg/dL (ref 6–24)
CHLORIDE: 101 mmol/L (ref 96–106)
CO2: 22 mmol/L (ref 18–29)
Calcium: 9 mg/dL (ref 8.7–10.2)
Creatinine, Ser: 0.59 mg/dL (ref 0.57–1.00)
GFR, EST AFRICAN AMERICAN: 123 mL/min/{1.73_m2} (ref >59)
GFR, EST NON AFRICAN AMERICAN: 106 mL/min/{1.73_m2} (ref >59)
GLOBULIN, TOTAL: 3.3 g/dL (ref 1.5–4.5)
GLUCOSE: 96 mg/dL (ref 65–99)
POTASSIUM: 4.5 mmol/L (ref 3.5–5.2)
SODIUM: 141 mmol/L (ref 134–144)
Total Protein: 7.5 g/dL (ref 6.0–8.5)

## 2015-04-04 NOTE — Progress Notes (Signed)
Internal Medicine Clinic Attending  Case discussed with Dr. Jones at the time of the visit.  We reviewed the resident's history and exam and pertinent patient test results.  I agree with the assessment, diagnosis, and plan of care documented in the resident's note.  

## 2015-04-21 ENCOUNTER — Ambulatory Visit: Payer: Self-pay | Admitting: Internal Medicine

## 2015-04-24 ENCOUNTER — Encounter: Payer: Self-pay | Admitting: Internal Medicine

## 2015-04-24 ENCOUNTER — Ambulatory Visit (INDEPENDENT_AMBULATORY_CARE_PROVIDER_SITE_OTHER): Payer: Self-pay | Admitting: Internal Medicine

## 2015-04-24 VITALS — BP 117/67 | HR 77 | Temp 98.4°F | Ht 65.0 in | Wt 184.3 lb

## 2015-04-24 DIAGNOSIS — R1013 Epigastric pain: Secondary | ICD-10-CM

## 2015-04-24 NOTE — Assessment & Plan Note (Signed)
Suspect this is mostly related to GERD and dyspepsia, however, today she describes a pain that moves into her chest and throat and is worse when lying flat, when she eats solid foods, and cold liquids. Given this description of odynophagia, wonder if this is related to esophageal spasm vs dysmotility. Her exam is benign, tender on exam in the epigastrium, but no pain when she is distracted. She does admit the her epigastric discomfort has gotten better since restarting her PPI.  -Refer to GI for workup for esophageal spasm vs dysmotility given her very prolonged history of GI complaints and odynophagia with cold liquids and solids.  -Continue PPI -RTC in 6 weeks.

## 2015-04-24 NOTE — Patient Instructions (Addendum)
1. Please make a follow up for 6 weeks.   2. Please take all medications as previously prescribed.  Continue Protonix.  We will try and set you up with a gastroenterologist to look into your chest discomfort.   3. If you have worsening of your symptoms or new symptoms arise, please call the clinic ([phone]), or go to the ER immediately if symptoms are severe.  You have done a great job in taking all your medications. Please continue to do this.
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
## 2015-04-28 NOTE — Progress Notes (Signed)
Internal Medicine Clinic Attending  Case discussed with Dr. Jones at the time of the visit.  We reviewed the resident's history and exam and pertinent patient test results.  I agree with the assessment, diagnosis, and plan of care documented in the resident's note.  

## 2015-05-01 ENCOUNTER — Encounter: Payer: Self-pay | Admitting: *Deleted

## 2015-05-06 ENCOUNTER — Encounter: Payer: Self-pay | Admitting: Internal Medicine

## 2015-05-06 DIAGNOSIS — N6320 Unspecified lump in the left breast, unspecified quadrant: Secondary | ICD-10-CM | POA: Insufficient documentation

## 2015-05-06 HISTORY — DX: Unspecified lump in the left breast, unspecified quadrant: N63.20

## 2015-05-13 ENCOUNTER — Telehealth: Payer: Self-pay | Admitting: Internal Medicine

## 2015-05-13 ENCOUNTER — Ambulatory Visit: Payer: Self-pay | Admitting: Internal Medicine

## 2015-05-13 ENCOUNTER — Encounter: Payer: Self-pay | Admitting: Gastroenterology

## 2015-05-13 NOTE — Telephone Encounter (Signed)
No fee

## 2015-05-30 ENCOUNTER — Ambulatory Visit: Payer: Self-pay | Admitting: Internal Medicine

## 2015-06-09 ENCOUNTER — Encounter: Payer: Self-pay | Admitting: Internal Medicine

## 2015-06-09 ENCOUNTER — Ambulatory Visit (INDEPENDENT_AMBULATORY_CARE_PROVIDER_SITE_OTHER): Payer: No Typology Code available for payment source | Admitting: Internal Medicine

## 2015-06-09 ENCOUNTER — Ambulatory Visit: Payer: Self-pay

## 2015-06-09 VITALS — BP 141/65 | HR 85 | Temp 98.5°F | Ht 65.0 in | Wt 185.5 lb

## 2015-06-09 DIAGNOSIS — R1013 Epigastric pain: Secondary | ICD-10-CM
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
## 2015-06-09 NOTE — Patient Instructions (Signed)
Continue taking protonix.

## 2015-06-10 NOTE — Assessment & Plan Note (Addendum)
Pt here for 6 week f/u on abd pain thought to be due to GERD and she was continued on protonix. She has an appt set up w/ GI 4/25. She states her abd pain has improved, she is eating well, not having any nausea or vomiting. However during exam pt flinched in pain and held on to her left side. On further questioning pt said she has had left sided abd pain for months that is intermittent. This pain started when she was treated w/ PT for lumbar radiculopathy in 8/201. She wast having left sided sciatica at that time. Pt states her lt lower quadrant begins in her back and radiates to her left flank and towards her LLQ. Likely this pain is related to lumbar radiculopathy. She denies fevers, NS, and chills that would be concerning for malignancy such as ovarian cancer; she has a hx of hysterectomy noted, unsure if her ovaries were taken out. Her weight has been stable. Denies melena, abn bowel movements, and bloody stools that would be concerning for diverticulosis.   - advised to continue taking protonix.  - f/u GI recs - continue tylenol 1000mg  q6h prn for pain

## 2015-06-19 ENCOUNTER — Ambulatory Visit: Payer: Self-pay

## 2015-06-24 ENCOUNTER — Encounter: Payer: Self-pay | Admitting: Gastroenterology

## 2015-06-24 ENCOUNTER — Ambulatory Visit (INDEPENDENT_AMBULATORY_CARE_PROVIDER_SITE_OTHER): Payer: No Typology Code available for payment source | Admitting: Gastroenterology

## 2015-06-24 VITALS — BP 132/80 | HR 80 | Ht 65.0 in | Wt 184.0 lb

## 2015-06-24 DIAGNOSIS — R112 Nausea with vomiting, unspecified: Secondary | ICD-10-CM

## 2015-06-24 DIAGNOSIS — R1013 Epigastric pain: Secondary | ICD-10-CM

## 2015-06-24 DIAGNOSIS — R1032 Left lower quadrant pain: Secondary | ICD-10-CM

## 2015-06-24 NOTE — Patient Instructions (Addendum)
You will be set up for a CT scan of abdomen and pelvis with IV and oral contrast for epigastric, LLQ pains.  You have been scheduled for a CT scan of the abdomen and pelvis at [facility] ([address]---this is in the same building as Press [facility]).   You are scheduled on 06/27/15 at 9 am. You should arrive 15 minutes prior to your appointment time for registration. Please follow the written instructions below on the day of your exam:  WARNING: IF YOU ARE ALLERGIC TO IODINE/X-RAY DYE, PLEASE NOTIFY RADIOLOGY IMMEDIATELY AT [phone]! YOU WILL BE GIVEN A 13 HOUR PREMEDICATION PREP.  1) Do not eat or drink anything after 5 am (4 hours prior to your test) 2) You have been given 2 bottles of oral contrast to drink. The solution may taste better if refrigerated, but do NOT add ice or any other liquid to this solution. Shake well before drinking.    Drink 1 bottle of contrast @ 7 am (2 hours prior to your exam)  Drink 1 bottle of contrast @ 8 am (1 hour prior to your exam)  You may take any medications as prescribed with a small amount of water except for the following: Metformin, Glucophage, Glucovance, Avandamet, Riomet, Fortamet, Actoplus Met, Janumet, Glumetza or Metaglip. The above medications must be held the day of the exam AND 48 hours after the exam.  The purpose of you drinking the oral contrast is to aid in the visualization of your intestinal tract. The contrast solution may cause some diarrhea. Before your exam is started, you will be given a small amount of fluid to drink. Depending on your individual set of symptoms, you may also receive an intravenous injection of x-ray contrast/dye. Plan on being at [facility] for 30 minutes or longer, depending on the type of exam you are having performed.  This test typically takes 30-45 minutes to complete.  If you have any questions regarding your exam or if you need to reschedule, you may call the CT department at  [phone] between the hours of 8:00 am and 5:00 pm, Monday-Friday.  ________________________________________________________________________
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
## 2015-06-25 NOTE — Progress Notes (Signed)
Internal Medicine Clinic Attending  Case discussed with Dr. Truong at the time of the visit.  We reviewed the resident's history and exam and pertinent patient test results.  I agree with the assessment, diagnosis, and plan of care documented in the resident's note.  

## 2015-06-27 ENCOUNTER — Ambulatory Visit (INDEPENDENT_AMBULATORY_CARE_PROVIDER_SITE_OTHER)
Admission: RE | Admit: 2015-06-27 | Discharge: 2015-06-27 | Disposition: A | Discharge status: Home / Self Care | Payer: No Typology Code available for payment source | Source: Ambulatory Visit | Attending: Gastroenterology | Admitting: Gastroenterology

## 2015-06-27 DIAGNOSIS — R1032 Left lower quadrant pain: Secondary | ICD-10-CM

## 2015-06-27 DIAGNOSIS — R112 Nausea with vomiting, unspecified: Secondary | ICD-10-CM

## 2015-06-27 DIAGNOSIS — R1013 Epigastric pain: Secondary | ICD-10-CM

## 2015-06-27 MED ORDER — IOPAMIDOL (ISOVUE-300) INJECTION 61%
100.0000 mL | Freq: Once | INTRAVENOUS | Status: AC | PRN
  Administered 2015-06-27: 100 mL via INTRAVENOUS

## 2015-07-11 ENCOUNTER — Encounter: Payer: Self-pay | Admitting: Internal Medicine

## 2015-07-18 ENCOUNTER — Encounter: Payer: No Typology Code available for payment source | Admitting: Gastroenterology

## 2015-08-20 ENCOUNTER — Encounter: Payer: Self-pay | Admitting: *Deleted

## 2015-08-20 ENCOUNTER — Ambulatory Visit (AMBULATORY_SURGERY_CENTER): Payer: Self-pay | Admitting: *Deleted

## 2015-08-20 VITALS — Ht 63.0 in | Wt 181.6 lb

## 2015-08-20 DIAGNOSIS — R1032 Left lower quadrant pain: Secondary | ICD-10-CM

## 2015-08-20 NOTE — Progress Notes (Signed)
No egg or soy allergy known to patient - no allergy but doesn't eat eggs  No issues with past sedation with any surgeries  or procedures, no intubation problems -only one surgery but with issues of post op N/V  No diet pills per patient No home 02 use per patient  No blood thinners per patient  Pt states   issues with constipation at times only, not chronic [employer] Communicationsohu Mohammad , interpreter with pt today

## 2015-09-03 ENCOUNTER — Encounter: Payer: Self-pay | Admitting: Gastroenterology

## 2015-09-03 ENCOUNTER — Ambulatory Visit (AMBULATORY_SURGERY_CENTER): Payer: No Typology Code available for payment source | Admitting: Gastroenterology

## 2015-09-03 VITALS — BP 141/68 | HR 76 | Temp 99.1°F | Resp 13 | Ht 72.0 in | Wt 184.0 lb

## 2015-09-03 DIAGNOSIS — B9681 Helicobacter pylori [H. pylori] as the cause of diseases classified elsewhere: Secondary | ICD-10-CM

## 2015-09-03 DIAGNOSIS — K2950 Unspecified chronic gastritis without bleeding: Secondary | ICD-10-CM

## 2015-09-03 DIAGNOSIS — R1032 Left lower quadrant pain: Secondary | ICD-10-CM

## 2015-09-03 DIAGNOSIS — K2970 Gastritis, unspecified, without bleeding: Secondary | ICD-10-CM

## 2015-09-03 MED ORDER — SODIUM CHLORIDE 0.9 % IV SOLN: 500.0000 mL | INTRAVENOUS | Status: DC

## 2015-09-03 NOTE — Op Note (Signed)
[facility] Patient Name: Abigail Butler Procedure Date: 09/03/2015 9:09 AM MRN: [id number] Endoscopist: Rachael Fee , MD Age: 50 Referring MD:  Date of Birth: 10-18-1965 Gender: Female Account #: [id number] Procedure:                Upper GI endoscopy Indications:              Epigastric abdominal pain, Abdominal pain in the                            left lower quadrant (labs and CT scan essentially                            normal) Medicines:                Monitored Anesthesia Care Procedure:                Pre-Anesthesia Assessment:                           - Prior to the procedure, a History and Physical                            was performed, and patient medications and                            allergies were reviewed. The patient's tolerance of                            previous anesthesia was also reviewed. The risks                            and benefits of the procedure and the sedation                            options and risks were discussed with the patient.                            All questions were answered, and informed consent                            was obtained. Prior Anticoagulants: The patient has                            taken no previous anticoagulant or antiplatelet                            agents. ASA Grade Assessment: II - A patient with                            mild systemic disease. After reviewing the risks                            and benefits, the patient was deemed in  satisfactory condition to undergo the procedure.                           After obtaining informed consent, the endoscope was                            passed under direct vision. Throughout the                            procedure, the patient's blood pressure, pulse, and                            oxygen saturations were monitored continuously. The                            Model GIF-[id number]) scope was introduced                             through the mouth, and advanced to the second part                            of duodenum. The upper GI endoscopy was                            accomplished without difficulty. The patient                            tolerated the procedure well. Scope In: Scope Out: Findings:                 The esophagus was normal.                           Mild inflammation characterized by erythema,                            friability and granularity was found in the entire                            examined stomach. Biopsies were taken with a cold                            forceps for histology.                           The examined duodenum was normal. Complications:            No immediate complications. Estimated blood loss:                            None. Estimated Blood Loss:     Estimated blood loss: none. Impression:               - Normal esophagus.                           - Gastritis. Biopsied.                           -  Normal examined duodenum. Recommendation:           - Patient has a contact number available for                            emergencies. The signs and symptoms of potential                            delayed complications were discussed with the                            patient. Return to normal activities tomorrow.                            Written discharge instructions were provided to the                            patient.                           - Resume previous diet.                           - Continue present medications.                           - Await pathology results.                           - No repeat upper endoscopy. Rachael Feeaniel P Marlo Arriola, MD 09/03/2015 9:24:57 AM This report has been signed electronically.

## 2015-09-03 NOTE — Progress Notes (Signed)
No problems noted in the recovery room. maw 

## 2015-09-03 NOTE — Progress Notes (Signed)
Pt has an interpreter, Azucena CecilNuha Mohammed, Arabic interpreter.  maw

## 2015-09-03 NOTE — Progress Notes (Signed)
Report to PACU, RN, vss, BBS= Clear.  

## 2015-09-03 NOTE — Patient Instructions (Addendum)
YOU HAD AN ENDOSCOPIC PROCEDURE TODAY AT THE [facility]:   Refer to the procedure report that was given to you for any specific questions about what was found during the examination.  If the procedure report does not answer your questions, please call your gastroenterologist to clarify.  If you requested that your care partner not be given the details of your procedure findings, then the procedure report has been included in a sealed envelope for you to review at your convenience later.  YOU SHOULD EXPECT: Some feelings of bloating in the abdomen. Passage of more gas than usual.  Walking can help get rid of the air that was put into your GI tract during the procedure and reduce the bloating. If you had a lower endoscopy (such as a colonoscopy or flexible sigmoidoscopy) you may notice spotting of blood in your stool or on the toilet paper. If you underwent a bowel prep for your procedure, you may not have a normal bowel movement for a few days.  Please Note:  You might notice some irritation and congestion in your nose or some drainage.  This is from the oxygen used during your procedure.  There is no need for concern and it should clear up in a day or so.  SYMPTOMS TO REPORT IMMEDIATELY:   Following upper endoscopy (EGD)  Vomiting of blood or coffee ground material  New chest pain or pain under the shoulder blades  Painful or persistently difficult swallowing  New shortness of breath  Fever of 100F or higher  Black, tarry-looking stools  For urgent or emergent issues, a gastroenterologist can be reached at any hour by calling [phone].   DIET: Your first meal following the procedure should be a small meal and then it is ok to progress to your normal diet. Heavy or fried foods are harder to digest and may make you feel nauseous or bloated.  Likewise, meals heavy in dairy and vegetables can increase bloating.  Drink plenty of fluids but you should avoid alcoholic beverages for  24 hours.  ACTIVITY:  You should plan to take it easy for the rest of today and you should NOT DRIVE or use heavy machinery until tomorrow (because of the sedation medicines used during the test).    FOLLOW UP: Our staff will call the number listed on your records the next business day following your procedure to check on you and address any questions or concerns that you may have regarding the information given to you following your procedure. If we do not reach you, we will leave a message.  However, if you are feeling well and you are not experiencing any problems, there is no need to return our call.  We will assume that you have returned to your regular daily activities without incident.  If any biopsies were taken you will be contacted by phone or by letter within the next 1-3 weeks.  Please call us at [phone] if you have not heard about the biopsies in 3 weeks.    SIGNATURES/CONFIDENTIALITY: You and/or your care partner have signed paperwork which will be entered into your electronic medical record.  These signatures attest to the fact that that the information above on your After Visit Summary has been reviewed and is understood.  Full responsibility of the confidentiality of this discharge information lies with you and/or your care-partner.    Handout was given to your care partner on Gastritis. You may resume your current medications today. Await biopsy  results. Please call if any questions or concerns.

## 2015-09-03 NOTE — Progress Notes (Signed)
Called to room to assist during endoscopic procedure.  Patient ID and intended procedure confirmed with present staff. Received instructions for my participation in the procedure from the performing physician.  

## 2015-09-04 ENCOUNTER — Telehealth: Payer: Self-pay

## 2015-09-04 NOTE — Telephone Encounter (Signed)
  Follow up Call-  Call back number 09/03/2015  Post procedure Call Back phone  # [phone]  Permission to leave phone message Yes     Patient was called for follow up after her procedure on 09/03/2015. I spoke with the patients daughter and she reports that Abigail Butler has returned to her normal daily activities without any difficulty.

## 2015-09-10 ENCOUNTER — Other Ambulatory Visit: Payer: Self-pay

## 2015-09-10 MED ORDER — BIS SUBCIT-METRONID-TETRACYC 140-125-125 MG PO CAPS
3.0000 | ORAL_CAPSULE | Freq: Three times a day (TID) | ORAL | Status: DC
Start: 2015-09-10 — End: 2016-05-03

## 2015-09-10 MED ORDER — OMEPRAZOLE 20 MG PO CPDR: 20.0000 mg | DELAYED_RELEASE_CAPSULE | Freq: Two times a day (BID) | ORAL | Status: DC

## 2015-11-28 ENCOUNTER — Telehealth: Payer: Self-pay | Admitting: Internal Medicine

## 2015-11-28 NOTE — Telephone Encounter (Signed)
CALLED PT TIME TO RENEW GCCN CARD SHE WILL CALL BACK AND MAKE APT LATER

## 2016-02-16 ENCOUNTER — Ambulatory Visit: Payer: No Typology Code available for payment source

## 2016-02-19 ENCOUNTER — Telehealth: Payer: Self-pay | Admitting: Internal Medicine

## 2016-02-19 NOTE — Telephone Encounter (Signed)
APT. REMINDER CALL, LMTCB °

## 2016-02-20 ENCOUNTER — Ambulatory Visit: Payer: No Typology Code available for payment source

## 2016-03-02 ENCOUNTER — Ambulatory Visit: Payer: No Typology Code available for payment source

## 2016-05-03 ENCOUNTER — Ambulatory Visit (INDEPENDENT_AMBULATORY_CARE_PROVIDER_SITE_OTHER): Payer: No Typology Code available for payment source | Admitting: Internal Medicine

## 2016-05-03 ENCOUNTER — Ambulatory Visit (HOSPITAL_COMMUNITY)
Admission: RE | Admit: 2016-05-03 | Discharge: 2016-05-03 | Disposition: A | Discharge status: Home / Self Care | Payer: No Typology Code available for payment source | Source: Ambulatory Visit | Attending: Internal Medicine | Admitting: Internal Medicine

## 2016-05-03 ENCOUNTER — Encounter: Payer: Self-pay | Admitting: Internal Medicine

## 2016-05-03 VITALS — BP 139/62 | HR 82 | Temp 98.4°F | Ht 65.0 in | Wt 178.9 lb

## 2016-05-03 DIAGNOSIS — R918 Other nonspecific abnormal finding of lung field: Secondary | ICD-10-CM | POA: Insufficient documentation

## 2016-05-03 DIAGNOSIS — R002 Palpitations: Secondary | ICD-10-CM

## 2016-05-03 DIAGNOSIS — R61 Generalized hyperhidrosis: Secondary | ICD-10-CM

## 2016-05-03 DIAGNOSIS — R05 Cough: Secondary | ICD-10-CM | POA: Insufficient documentation

## 2016-05-03 DIAGNOSIS — R509 Fever, unspecified: Secondary | ICD-10-CM | POA: Insufficient documentation

## 2016-05-03 DIAGNOSIS — R42 Dizziness and giddiness: Secondary | ICD-10-CM

## 2016-05-03 DIAGNOSIS — K219 Gastro-esophageal reflux disease without esophagitis: Secondary | ICD-10-CM

## 2016-05-03 DIAGNOSIS — Z79899 Other long term (current) drug therapy: Secondary | ICD-10-CM

## 2016-05-03 MED ORDER — OMEPRAZOLE 20 MG PO CPDR: 20.0000 mg | DELAYED_RELEASE_CAPSULE | Freq: Every day | ORAL | 6 refills | Status: DC

## 2016-05-03 MED ORDER — TRIAMTERENE-HCTZ 37.5-25 MG PO CAPS: 1.0000 | ORAL_CAPSULE | Freq: Every day | ORAL | 1 refills | Status: DC
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
## 2016-05-03 NOTE — Patient Instructions (Signed)
Start taking trimeterene- hydrochlorothiazide daily. This is the medication she was on that helped her dizziness.   I have referred you to physical therapy to help with dizziness.   Start taking prilosec 20mg  once daily 30 minutes before breakfast.

## 2016-05-04 LAB — CBC WITH DIFFERENTIAL/PLATELET
BASOS ABS: 0 10*3/uL (ref 0.0–0.2)
Basos: 1 %
EOS (ABSOLUTE): 0.2 10*3/uL (ref 0.0–0.4)
Eos: 3 %
HEMOGLOBIN: 12.2 g/dL (ref 11.1–15.9)
Hematocrit: 37.2 % (ref 34.0–46.6)
Immature Grans (Abs): 0 10*3/uL (ref 0.0–0.1)
Immature Granulocytes: 0 %
LYMPHS ABS: 3.4 10*3/uL — AB (ref 0.7–3.1)
Lymphs: 46 %
MCH: 27.8 pg (ref 26.6–33.0)
MCHC: 32.8 g/dL (ref 31.5–35.7)
MCV: 85 fL (ref 79–97)
MONOCYTES: 7 %
MONOS ABS: 0.5 10*3/uL (ref 0.1–0.9)
NEUTROS ABS: 3.2 10*3/uL (ref 1.4–7.0)
Neutrophils: 43 %
Platelets: 245 10*3/uL (ref 150–379)
RBC: 4.39 x10E6/uL (ref 3.77–5.28)
RDW: 13.6 % (ref 12.3–15.4)
WBC: 7.3 10*3/uL (ref 3.4–10.8)

## 2016-05-04 LAB — BMP8+ANION GAP
Anion Gap: 18 mmol/L (ref 10.0–18.0)
BUN / CREAT RATIO: 26 — AB (ref 9–23)
BUN: 13 mg/dL (ref 6–24)
CHLORIDE: 101 mmol/L (ref 96–106)
CO2: 21 mmol/L (ref 18–29)
Calcium: 9.1 mg/dL (ref 8.7–10.2)
Creatinine, Ser: 0.5 mg/dL — ABNORMAL LOW (ref 0.57–1.00)
GFR calc non Af Amer: 112 mL/min/{1.73_m2} (ref >59)
GFR, EST AFRICAN AMERICAN: 129 mL/min/{1.73_m2} (ref >59)
GLUCOSE: 92 mg/dL (ref 65–99)
POTASSIUM: 4.5 mmol/L (ref 3.5–5.2)
SODIUM: 140 mmol/L (ref 134–144)

## 2016-05-04 LAB — TSH: TSH: 0.416 u[IU]/mL — AB (ref 0.450–4.500)

## 2016-05-04 NOTE — Assessment & Plan Note (Signed)
Assessment: Patient is not on any medications other than Tylenol that she takes when necessary. She had an EGD last year that gastritis with H. pylori noted on biopsy.   Plan: Refilled Prilosec 20 mg daily and instructed patient to start taking this again.

## 2016-05-04 NOTE — Assessment & Plan Note (Signed)
Assessment: Patient is reporting dizziness and palpitations for the past 2 weeks. This is similar to her previous episodes of dizziness where she feels the room is spinning. She has only been taking Tylenol for dizziness which is no longer helping her. 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 She has worked with physical therapy in the past for Epley maneuvers. She does not want a referral at this time because she  states that she does not have any transportation. in the past she was given triamterene hydrochlorothiazide which helped with her vertigo per chart review. Orthostatic vitals were negative.  Plan: Attempted Epley maneuver in the exam room. Patient was unable to tolerate Epley maneuvers bilaterally due to extreme dizziness. No nystagmus was noted during exam. She was given a handout Epley maneuvers and  and strongly encouraged to go to physical therapy  for vestibular rehabilitation. Physical therapy referral placed. Triamterene hydrochlorothiazide 37. 5-25 milligrams daily ordered. Checking TSH for reported palpitations although she has regular rate and rhythm on exam. Also checking a basic metabolic panel and CBC.

## 2016-05-04 NOTE — Assessment & Plan Note (Addendum)
Assessment: The past 2 weeks patient has had nighttime fevers and night sweats. She has not measured her temperature. Today she is afebrile. [place] from [place] December last year. Denies weight loss. She has a cough that is nonproductive.  Plan: Checking quantiferon and chest x-ray to rule out tuberculosis.CXR negative.   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 [facility] [place] [facility] and instructed to have patient call [phone] to schedule an appointment for further evaluation and to be set up with tuberculosis medications. Attempted to call patient today at 1:15 PM however patient does not speak [place] and states her grandson will call back. Sending front desk a message to try and relay message to pt and family.  Update 3/13-- pt called clinic requesting call back. I called her today and spoke with her daughter Malachi Bondsda and informed her of her results and to call the health department for an appointment.

## 2016-05-05 NOTE — Progress Notes (Signed)
Internal Medicine Clinic Attending  Case discussed with Dr. Truong at the time of the visit.  We reviewed the resident's history and exam and pertinent patient test results.  I agree with the assessment, diagnosis, and plan of care documented in the resident's note.  

## 2016-05-06 LAB — QUANTIFERON IN TUBE
QFT TB AG MINUS NIL VALUE: 6.72 IU/mL
QUANTIFERON MITOGEN VALUE: 6.62 IU/mL
QUANTIFERON TB AG VALUE: 6.74 IU/mL
QUANTIFERON TB GOLD: POSITIVE — AB
Quantiferon Nil Value: 0.02 IU/mL

## 2016-05-06 LAB — QUANTIFERON TB GOLD ASSAY (BLOOD)

## 2016-05-11 ENCOUNTER — Telehealth: Payer: Self-pay | Admitting: Internal Medicine

## 2016-05-11 NOTE — Telephone Encounter (Signed)
Patient requesting a call back about her test results.

## 2016-05-12 ENCOUNTER — Telehealth: Payer: Self-pay | Admitting: Internal Medicine

## 2016-05-12 NOTE — Telephone Encounter (Signed)
Calling to clarify about a positive test please call.

## 2016-05-13 NOTE — Telephone Encounter (Signed)
Called the number and left a message. If they call  Back can you tell them that pt needs to make an appointment with them for further evaluation. See my clinic note for further details. Thanks.   Dr. Danella Pentonruong

## 2016-05-21 ENCOUNTER — Encounter: Payer: Self-pay | Admitting: Internal Medicine

## 2016-06-07 ENCOUNTER — Ambulatory Visit: Payer: No Typology Code available for payment source

## 2016-06-07 ENCOUNTER — Encounter: Payer: Self-pay | Admitting: Internal Medicine

## 2016-07-05 ENCOUNTER — Telehealth: Payer: Self-pay | Admitting: Internal Medicine

## 2016-07-05 NOTE — Telephone Encounter (Signed)
CALLED PATIENT, LMTCB IF SHE NEEDS HELP FILLING OUT PAPERS FOR TRANSPORTATION.

## 2016-08-31 ENCOUNTER — Telehealth: Payer: Self-pay | Admitting: Internal Medicine

## 2016-08-31 NOTE — Telephone Encounter (Signed)
CALLED PATIENT, LMTCB, IT IS TIME TO RENEW GCCN CARD °

## 2016-09-07 ENCOUNTER — Ambulatory Visit: Payer: No Typology Code available for payment source

## 2016-09-28 ENCOUNTER — Ambulatory Visit: Payer: No Typology Code available for payment source

## 2016-09-30 ENCOUNTER — Ambulatory Visit: Payer: No Typology Code available for payment source

## 2016-10-15 ENCOUNTER — Ambulatory Visit (INDEPENDENT_AMBULATORY_CARE_PROVIDER_SITE_OTHER): Payer: No Typology Code available for payment source | Admitting: Internal Medicine

## 2016-10-15 VITALS — BP 153/51 | HR 81 | Temp 98.1°F | Ht 65.0 in | Wt 184.7 lb

## 2016-10-15 DIAGNOSIS — M79601 Pain in right arm: Secondary | ICD-10-CM

## 2016-10-15 DIAGNOSIS — M7989 Other specified soft tissue disorders: Secondary | ICD-10-CM

## 2016-10-15 HISTORY — DX: Pain in right arm: M79.601

## 2016-10-15 MED ORDER — NAPROXEN 500 MG PO TABS: 500.0000 mg | ORAL_TABLET | Freq: Two times a day (BID) | ORAL | 0 refills | Status: DC

## 2016-10-15 MED ORDER — GABAPENTIN 100 MG PO CAPS: 100.0000 mg | ORAL_CAPSULE | Freq: Three times a day (TID) | ORAL | 1 refills | Status: DC
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
## 2016-10-15 NOTE — Patient Instructions (Addendum)
Abigail Butler it was nice meeting you today.  -I have ordered an MRI of your neck. Our clinic will get it scheduled for you.  -Take naproxen 500 mg 1 tablet twice daily with food  -Take gabapentin 100 mg 1 capsule 3 times a day  -Return to the clinic in 1 month for a follow-up.

## 2016-10-15 NOTE — Assessment & Plan Note (Signed)
History of present illness Patient is complaining of swelling and pain in her right arm for the past 2 weeks. Denies any history of trauma to the area or falls. States the pain is intermittent in nature and worse at night. The swelling is also intermittent. He describes the pain as cramping in nature. Pain starts at the level of her anterior neck on the right side and then radiates to her right shoulder and entire right upper extremity. It is associated with numbness on the entire right side of her face, right anterior neck, right shoulder, right arm, right forearm, and only her right index finger. Also reports having tingling in her right arm. She denies having any pain at present but states when she gets the pain it is 7 out of 10 in intensity. Denies having any personal or family history of blood clots. Denies having any fevers, chills, or history of malignancy. Denies having any urinary/fecal incontinence or saddle anesthesia. Patient reported dizziness when asked to look up during examination of extraocular movements. She described it as a room spinning sensation. Stated this is a chronic problem for her.   Assessment Unclear etiology of patient's current presentation. Although her presentation does not fit a specific dermatomal pattern, cervical radiculopathy remains the highest on her differential. No red flags to suggest cauda equina syndrome. It is unclear what is causing the patient to have vertigo when looking up. At this visit, the goal is to workup her pain. On exam, she was tender to palpation of her C-spine, anterior neck, shoulder, arm, and elbow on the right. Strength was mildly reduced but difficult to assess in the setting of pain. Sensation to light touch intact. Bilateral upper extremities appear symmetrical in size; it is difficult to appreciate edema due to patient's large body habitus. There is no erythema or increased warmth. No history of recent hospitalization or PICC line placement to  suggest DVT. Impingement syndrome/ rotator cuff tear are also on the differential as patient had difficulty raising her arm above 90.  Plan -Would like to order an MRI of her C-spine to rule out cervical radiculopathy. If MRI does not give an explanation, would recommend doing an ultrasound of her right shoulder at the next visit to rule out impingement syndrome/ rotator cuff tear. -Gabapentin 100 mg 3 times a day -Naproxen 500 mg twice daily with meals -Return to the clinic in 1 month for a follow-up -Please address her chronic vertigo at the next visit

## 2016-10-19 NOTE — Progress Notes (Signed)
Internal Medicine Clinic Attending  Case discussed with Dr. Rathoreat the time of the visit. We reviewed the resident's history and exam and pertinent patient test results. I agree with the assessment, diagnosis, and plan of care documented in the resident's note.  

## 2016-11-02 ENCOUNTER — Other Ambulatory Visit: Payer: Self-pay | Admitting: Internal Medicine

## 2016-11-02 DIAGNOSIS — M542 Cervicalgia: Secondary | ICD-10-CM

## 2016-11-02 NOTE — Progress Notes (Unsigned)
Called by radiology department to change previously ordered MR cervical spine w/ contrast ordered by Dr. Loney Lohathore for neck pain and radiculopathy to right arm to w/o contrast.  New order placed.  Abigail MarketGorica Florina Glas, MD IMTS - PGY2 Pager [phone]

## 2016-11-02 NOTE — Progress Notes (Signed)
Thanks [employer]. I have cancelled the previous order.

## 2016-11-02 NOTE — Addendum Note (Signed)
Addended by: John GiovanniATHORE, Marcelo Ickes on: 11/02/2016 05:19 PM   Modules accepted: Orders

## 2016-11-03 ENCOUNTER — Ambulatory Visit (HOSPITAL_COMMUNITY): Admission: RE | Admit: 2016-11-03 | Payer: Self-pay | Source: Ambulatory Visit
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
## 2016-12-08 ENCOUNTER — Ambulatory Visit (INDEPENDENT_AMBULATORY_CARE_PROVIDER_SITE_OTHER): Payer: Self-pay | Admitting: Internal Medicine

## 2016-12-08 ENCOUNTER — Encounter: Payer: Self-pay | Admitting: Internal Medicine

## 2016-12-08 ENCOUNTER — Encounter: Payer: No Typology Code available for payment source | Admitting: Internal Medicine

## 2016-12-08 VITALS — BP 126/59 | HR 80 | Temp 98.8°F | Ht 65.0 in | Wt 188.8 lb

## 2016-12-08 DIAGNOSIS — Z23 Encounter for immunization: Secondary | ICD-10-CM

## 2016-12-08 DIAGNOSIS — Z1239 Encounter for other screening for malignant neoplasm of breast: Secondary | ICD-10-CM

## 2016-12-08 DIAGNOSIS — Z1231 Encounter for screening mammogram for malignant neoplasm of breast: Secondary | ICD-10-CM

## 2016-12-08 DIAGNOSIS — R1900 Intra-abdominal and pelvic swelling, mass and lump, unspecified site: Secondary | ICD-10-CM

## 2016-12-08 DIAGNOSIS — M79601 Pain in right arm: Secondary | ICD-10-CM

## 2016-12-08 DIAGNOSIS — M5116 Intervertebral disc disorders with radiculopathy, lumbar region: Secondary | ICD-10-CM

## 2016-12-08 MED ORDER — NAPROXEN 500 MG PO TABS: 500.0000 mg | ORAL_TABLET | Freq: Two times a day (BID) | ORAL | 0 refills | Status: DC

## 2016-12-08 NOTE — Patient Instructions (Addendum)
Use heat on your right shoulder, neck and your back to help relax the muscles.   Take naproxen  twice a day with a meal.  Cervical Strain and Sprain Rehab Ask your health care provider which exercises are safe for you. Do exercises exactly as told by your health care provider and adjust them as directed. It is normal to feel mild stretching, pulling, tightness, or discomfort as you do these exercises, but you should stop right away if you feel sudden pain or your pain gets worse.Do not begin these exercises until told by your health care provider. Stretching and range of motion exercises These exercises warm up your muscles and joints and improve the movement and flexibility of your neck. These exercises also help to relieve pain, numbness, and tingling. Exercise A: Cervical side bend  1. Using good posture, sit on a stable chair or stand up. 2. Without moving your shoulders, slowly tilt your left / right ear to your shoulder until you feel a stretch in your neck muscles. You should be looking straight ahead. 3. Hold for __________ seconds. 4. Repeat with the other side of your neck. Repeat __________ times. Complete this exercise __________ times a day. Exercise B: Cervical rotation  1. Using good posture, sit on a stable chair or stand up. 2. Slowly turn your head to the side as if you are looking over your left / right shoulder. ? Keep your eyes level with the ground. ? Stop when you feel a stretch along the side and the back of your neck. 3. Hold for __________ seconds. 4. Repeat this by turning to your other side. Repeat __________ times. Complete this exercise __________ times a day. Exercise C: Thoracic extension and pectoral stretch 1. Roll a towel or a small blanket so it is about 4 inches (10 cm) in diameter. 2. Lie down on your back on a firm surface. 3. Put the towel lengthwise, under your spine in the middle of your back. It should not be not under your shoulder blades.  The towel should line up with your spine from your middle back to your lower back. 4. Put your hands behind your head and let your elbows fall out to your sides. 5. Hold for __________ seconds. Repeat __________ times. Complete this exercise __________ times a day. Strengthening exercises These exercises build strength and endurance in your neck. Endurance is the ability to use your muscles for a long time, even after your muscles get tired. Exercise D: Upper cervical flexion, isometric 1. Lie on your back with a thin pillow behind your head and a small rolled-up towel under your neck. 2. Gently tuck your chin toward your chest and nod your head down to look toward your feet. Do not lift your head off the pillow. 3. Hold for __________ seconds. 4. Release the tension slowly. Relax your neck muscles completely before you repeat this exercise. Repeat __________ times. Complete this exercise __________ times a day. Exercise E: Cervical extension, isometric  1. Stand about 6 inches (15 cm) away from a wall, with your back facing the wall. 2. Place a soft object, about 6-8 inches (15-20 cm) in diameter, between the back of your head and the wall. A soft object could be a small pillow, a ball, or a folded towel. 3. Gently tilt your head back and press into the soft object. Keep your jaw and forehead relaxed. 4. Hold for __________ seconds. 5. Release the tension slowly. Relax your neck muscles completely before you  repeat this exercise. Repeat __________ times. Complete this exercise __________ times a day. Posture and body mechanics  Body mechanics refers to the movements and positions of your body while you do your daily activities. Posture is part of body mechanics. Good posture and healthy body mechanics can help to relieve stress in your body's tissues and joints. Good posture means that your spine is in its natural S-curve position (your spine is neutral), your shoulders are pulled back  slightly, and your head is not tipped forward. The following are general guidelines for applying improved posture and body mechanics to your everyday activities. Standing  When standing, keep your spine neutral and keep your feet about hip-width apart. Keep a slight bend in your knees. Your ears, shoulders, and hips should line up.  When you do a task in which you stand in one place for a long time, place one foot up on a stable object that is 2-4 inches (5-10 cm) high, such as a footstool. This helps keep your spine neutral. Sitting   When sitting, keep your spine neutral and your keep feet flat on the floor. Use a footrest, if necessary, and keep your thighs parallel to the floor. Avoid rounding your shoulders, and avoid tilting your head forward.  When working at a desk or a computer, keep your desk at a height where your hands are slightly lower than your elbows. Slide your chair under your desk so you are close enough to maintain good posture.  When working at a computer, place your monitor at a height where you are looking straight ahead and you do not have to tilt your head forward or downward to look at the screen. Resting When lying down and resting, avoid positions that are most painful for you. Try to support your neck in a neutral position. You can use a contour pillow or a small rolled-up towel. Your pillow should support your neck but not push on it. This information is not intended to replace advice given to you by your health care provider. Make sure you discuss any questions you have with your health care provider. Document Released: 02/15/2005 Document Revised: 10/23/2015 Document Reviewed: 01/22/2015 Elsevier Interactive Patient Education  2018 [employer].   Back Exercises If you have pain in your back, do these exercises 2-3 times each day or as told by your doctor. When the pain goes away, do the exercises once each day, but repeat the steps more times for each exercise  (do more repetitions). If you do not have pain in your back, do these exercises once each day or as told by your doctor. Exercises Single Knee to Chest  Do these steps 3-5 times in a row for each leg: 5. Lie on your back on a firm bed or the floor with your legs stretched out. 6. Bring one knee to your chest. 7. Hold your knee to your chest by grabbing your knee or thigh. 8. Pull on your knee until you feel a gentle stretch in your lower back. 9. Keep doing the stretch for 10-30 seconds. 10. Slowly let go of your leg and straighten it.  Pelvic Tilt  Do these steps 5-10 times in a row: 1. Lie on your back on a firm bed or the floor with your legs stretched out. 2. Bend your knees so they point up to the ceiling. Your feet should be flat on the floor. 3. Tighten your lower belly (abdomen) muscles to press your lower back against the floor.  This will make your tailbone point up to the ceiling instead of pointing down to your feet or the floor. 4. Stay in this position for 5-10 seconds while you gently tighten your muscles and breathe evenly.  Cat-Cow  Do these steps until your lower back bends more easily: 6. Get on your hands and knees on a firm surface. Keep your hands under your shoulders, and keep your knees under your hips. You may put padding under your knees. 7. Let your head hang down, and make your tailbone point down to the floor so your lower back is round like the back of a cat. 8. Stay in this position for 5 seconds. 9. Slowly lift your head and make your tailbone point up to the ceiling so your back hangs low (sags) like the back of a cow. 10. Stay in this position for 5 seconds.  Press-Ups  Do these steps 5-10 times in a row: 5. Lie on your belly (face-down) on the floor. 6. Place your hands near your head, about shoulder-width apart. 7. While you keep your back relaxed and keep your hips on the floor, slowly straighten your arms to raise the top half of your body and  lift your shoulders. Do not use your back muscles. To make yourself more comfortable, you may change where you place your hands. 8. Stay in this position for 5 seconds. 9. Slowly return to lying flat on the floor.  Bridges  Do these steps 10 times in a row: 6. Lie on your back on a firm surface. 7. Bend your knees so they point up to the ceiling. Your feet should be flat on the floor. 8. Tighten your butt muscles and lift your butt off of the floor until your waist is almost as high as your knees. If you do not feel the muscles working in your butt and the back of your thighs, slide your feet 1-2 inches farther away from your butt. 9. Stay in this position for 3-5 seconds. 10. Slowly lower your butt to the floor, and let your butt muscles relax.  If this exercise is too easy, try doing it with your arms crossed over your chest. Belly Crunches  Do these steps 5-10 times in a row: 1. Lie on your back on a firm bed or the floor with your legs stretched out. 2. Bend your knees so they point up to the ceiling. Your feet should be flat on the floor. 3. Cross your arms over your chest. 4. Tip your chin a little bit toward your chest but do not bend your neck. 5. Tighten your belly muscles and slowly raise your chest just enough to lift your shoulder blades a tiny bit off of the floor. 6. Slowly lower your chest and your head to the floor.  Back Lifts Do these steps 5-10 times in a row: 1. Lie on your belly (face-down) with your arms at your sides, and rest your forehead on the floor. 2. Tighten the muscles in your legs and your butt. 3. Slowly lift your chest off of the floor while you keep your hips on the floor. Keep the back of your head in line with the curve in your back. Look at the floor while you do this. 4. Stay in this position for 3-5 seconds. 5. Slowly lower your chest and your face to the floor.  Contact a doctor if:  Your back pain gets a lot worse when you do an  exercise.  Your back  pain does not lessen 2 hours after you exercise. If you have any of these problems, stop doing the exercises. Do not do them again unless your doctor says it is okay. Get help right away if:  You have sudden, very bad back pain. If this happens, stop doing the exercises. Do not do them again unless your doctor says it is okay. This information is not intended to replace advice given to you by your health care provider. Make sure you discuss any questions you have with your health care provider. Document Released: 03/20/2010 Document Revised: 07/24/2015 Document Reviewed: 04/11/2014 Elsevier Interactive Patient Education  [employer].
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
## 2016-12-09 ENCOUNTER — Encounter: Payer: Self-pay | Admitting: Internal Medicine

## 2016-12-09 DIAGNOSIS — R1900 Intra-abdominal and pelvic swelling, mass and lump, unspecified site: Secondary | ICD-10-CM

## 2016-12-09 HISTORY — DX: Intra-abdominal and pelvic swelling, mass and lump, unspecified site: R19.00

## 2016-12-09 NOTE — Assessment & Plan Note (Signed)
Patient with 2 month history of right shoulder and arm pain without weakness, numbness. Exam with diffuse, non specific tenderness; ROM appears intact, strength appears intact though unable to effectively communicate all strength testing to patient. There is no swelling or erythema on her arm so no concern for DVT at this time.  At her last appointment, she was referred for an MRI cervical spine to evaluate for cervical pathology, however patient states she never heard about the appointment.  Etiology is consistent with muscular strain.  Plan: --advised patient to use heat/neck stretching exercises --naproxen  BID --I do not think further imaging is warranted at this time; will not reschedule her MRI; if pain persists, can maybe consider plain Xrays

## 2016-12-09 NOTE — Assessment & Plan Note (Signed)
Patient with 2d h/o back pain with radiation into R leg and some tingling in R leg and no weakness/incontinence. She has known degenerative disc and facet disease in lumbar spine as well as mild spinal stenosis. Exam revealed just general muscular and subcutaneous tenderness; ROM and strength appear intact. Patient with similar presentation but with radiation to left leg a couple of years ago that was amenable to PT.   Plan: --naproxen  BID  --given back exercises --advised use of heat

## 2016-12-09 NOTE — Assessment & Plan Note (Signed)
At end of visit, patient mentioned wanting a pap smear as she has not had one in years. In her chart it is indicated that she had a hysterectomy though patient denies and no actual documentation of surgery/path can be found. She endorse feeling of fullness though I could not elicit if it was in her suprapubic area or genital area; she states she has had similar discomfort before and had a procedure done which helped her. She denies dysuria, hematuria, vaginal itching, vaginal discharge or bleeding. She declines a pap smear and pelvic exams today due to immense discomfort with lying down due to her current back pain.  Plan: --f/u in 4 weeks for pelvic exam

## 2016-12-13 NOTE — Progress Notes (Signed)
Case discussed with Dr. Svalina at the time of the visit.  We reviewed the resident's history and exam and pertinent patient test results.  I agree with the assessment, diagnosis and plan of care documented in the resident's note. 

## 2017-01-12 ENCOUNTER — Encounter: Payer: Self-pay | Admitting: Internal Medicine
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
## 2017-02-03 ENCOUNTER — Ambulatory Visit: Payer: Self-pay

## 2017-02-09 ENCOUNTER — Encounter: Payer: Self-pay | Admitting: Internal Medicine
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
## 2017-02-24 ENCOUNTER — Ambulatory Visit: Payer: Self-pay
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
## 2017-04-06 ENCOUNTER — Ambulatory Visit (HOSPITAL_COMMUNITY)
Admission: RE | Admit: 2017-04-06 | Discharge: 2017-04-06 | Disposition: A | Discharge status: Home / Self Care | Payer: Self-pay | Source: Ambulatory Visit | Attending: Internal Medicine | Admitting: Internal Medicine

## 2017-04-06 ENCOUNTER — Other Ambulatory Visit: Payer: Self-pay

## 2017-04-06 ENCOUNTER — Encounter: Payer: Self-pay | Admitting: Internal Medicine

## 2017-04-06 ENCOUNTER — Ambulatory Visit (INDEPENDENT_AMBULATORY_CARE_PROVIDER_SITE_OTHER): Payer: Self-pay | Admitting: Internal Medicine

## 2017-04-06 VITALS — BP 148/69 | HR 89 | Temp 98.7°F | Ht 65.0 in | Wt 193.1 lb

## 2017-04-06 DIAGNOSIS — R058 Other specified cough: Secondary | ICD-10-CM

## 2017-04-06 DIAGNOSIS — R079 Chest pain, unspecified: Secondary | ICD-10-CM

## 2017-04-06 DIAGNOSIS — R1013 Epigastric pain: Secondary | ICD-10-CM

## 2017-04-06 DIAGNOSIS — M79601 Pain in right arm: Secondary | ICD-10-CM

## 2017-04-06 DIAGNOSIS — Z8673 Personal history of transient ischemic attack (TIA), and cerebral infarction without residual deficits: Secondary | ICD-10-CM

## 2017-04-06 DIAGNOSIS — K219 Gastro-esophageal reflux disease without esophagitis: Secondary | ICD-10-CM

## 2017-04-06 DIAGNOSIS — R1310 Dysphagia, unspecified: Secondary | ICD-10-CM

## 2017-04-06 DIAGNOSIS — I493 Ventricular premature depolarization: Secondary | ICD-10-CM | POA: Insufficient documentation

## 2017-04-06 DIAGNOSIS — R05 Cough: Secondary | ICD-10-CM

## 2017-04-06 DIAGNOSIS — G8929 Other chronic pain: Secondary | ICD-10-CM

## 2017-04-06 DIAGNOSIS — M79621 Pain in right upper arm: Secondary | ICD-10-CM

## 2017-04-06 DIAGNOSIS — Z8619 Personal history of other infectious and parasitic diseases: Secondary | ICD-10-CM

## 2017-04-06 MED ORDER — ATORVASTATIN CALCIUM 40 MG PO TABS: 40.0000 mg | ORAL_TABLET | Freq: Every day | ORAL | 3 refills | Status: DC

## 2017-04-06 MED ORDER — ASPIRIN EC 81 MG PO TBEC: 81.0000 mg | DELAYED_RELEASE_TABLET | Freq: Every day | ORAL | 3 refills | Status: AC

## 2017-04-06 NOTE — Patient Instructions (Signed)
Please come sometime this week to get a chest x ray on the first floor of the hospital.  I have sent a referral to cardiology, the heart doctors, for further evaluation of your chest pain.  Please bring in stool sample in the yellow cup I provided so we can check that the bacteria they found in your stomach has been completely treated.  Please start taking a baby aspirin (81mg ) daily and atorvastatin 40mg  daily for cholesterol to decrease your risk for further strokes.

## 2017-04-07 ENCOUNTER — Encounter: Payer: Self-pay | Admitting: Internal Medicine

## 2017-04-07 DIAGNOSIS — R079 Chest pain, unspecified: Secondary | ICD-10-CM

## 2017-04-07 DIAGNOSIS — R05 Cough: Secondary | ICD-10-CM | POA: Insufficient documentation

## 2017-04-07 DIAGNOSIS — R058 Other specified cough: Secondary | ICD-10-CM

## 2017-04-07 HISTORY — DX: Chest pain, unspecified: R07.9

## 2017-04-07 HISTORY — DX: Other specified cough: R05.8

## 2017-04-07 LAB — CMP14 + ANION GAP
A/G RATIO: 1.3 (ref 1.2–2.2)
ALT: 13 IU/L (ref 0–32)
AST: 16 IU/L (ref 0–40)
Albumin: 4.3 g/dL (ref 3.5–5.5)
Alkaline Phosphatase: 78 IU/L (ref 39–117)
Anion Gap: 13 mmol/L (ref 10.0–18.0)
BUN/Creatinine Ratio: 17 (ref 9–23)
BUN: 10 mg/dL (ref 6–24)
CALCIUM: 9 mg/dL (ref 8.7–10.2)
CHLORIDE: 103 mmol/L (ref 96–106)
CO2: 24 mmol/L (ref 20–29)
Creatinine, Ser: 0.59 mg/dL (ref 0.57–1.00)
GFR calc Af Amer: 121 mL/min/{1.73_m2} (ref >59)
GFR calc non Af Amer: 105 mL/min/{1.73_m2} (ref >59)
GLUCOSE: 112 mg/dL — AB (ref 65–99)
Globulin, Total: 3.3 g/dL (ref 1.5–4.5)
POTASSIUM: 4.6 mmol/L (ref 3.5–5.2)
Sodium: 140 mmol/L (ref 134–144)
Total Protein: 7.6 g/dL (ref 6.0–8.5)

## 2017-04-07 LAB — T4, FREE: Free T4: 1.44 ng/dL (ref 0.82–1.77)

## 2017-04-07 LAB — CBC
Hematocrit: 37.4 % (ref 34.0–46.6)
Hemoglobin: 12.5 g/dL (ref 11.1–15.9)
MCH: 28 pg (ref 26.6–33.0)
MCHC: 33.4 g/dL (ref 31.5–35.7)
MCV: 84 fL (ref 79–97)
PLATELETS: 236 10*3/uL (ref 150–379)
RBC: 4.46 x10E6/uL (ref 3.77–5.28)
RDW: 13.2 % (ref 12.3–15.4)
WBC: 7 10*3/uL (ref 3.4–10.8)

## 2017-04-07 LAB — TSH: TSH: 0.468 u[IU]/mL (ref 0.450–4.500)

## 2017-04-07 NOTE — Assessment & Plan Note (Addendum)
Patient has h/o H pylori gastritis which was supposed to be treated, however, patient does not remember what medication she took. She has not had eradication confirmation. She endorses chronic epigastric pain, acid reflux, dysphagia; denies odynophagia, vomiting, D/C, melena or hematochezia.  Exam reveals epigastric tenderness.  Plan: --f/u H pylori stool antigen for evidence of eradication  Addendum: -H pylori Ag positive, indicating treatment failure. -Will treat with levoflox triple therapy - levofloxacin 500mg  daily, standard dose PPI BID (omeprazole 20mg  BID), and amoxacillin 1000mg  BID x14 days  -Will plan to retest afterwards (has to be off of PPI for at least 2 weeks prior to re-testing) -Attempted calling patient with instructions using [place] [employer]pre[employer], however unsuccessful - message left to call clinic back. -Will route to triage nursing to triage nursing to attempt again -[facility] - discussed with Dr. Selena BattenKim and can be provided with IM program pricing to help with cost as she is uninsured.

## 2017-04-07 NOTE — Assessment & Plan Note (Signed)
Patient not on ASA or statin any longer - stopped some time ago. No acute neuro symptoms or deficits noted.  Plan: --restart ASA 81mg  and atorvastatin 40mg  daily for secondary prevention

## 2017-04-07 NOTE — Assessment & Plan Note (Signed)
Patient with 1 wk h/o mildly productive cough mainly in AM, w/o hemoptysis. Has associated rhinorrhea and congestion. Subjective fevers at home but afebrile here.  Lungs CTAB w/o rales/wheezes.   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  Current etiology is likely viral.  Plan: --contacted health department; they will fax over records --CXR --symptomatic treatment with OTCs for now

## 2017-04-07 NOTE — Progress Notes (Signed)
Shoulder Injection Procedure Note  Pre-operative Diagnosis: right shoulder pain  Indications: Symptom relief from osteoarthritis and Diagnostic  Anesthesia: freeze spray   Procedure Details   Consent was obtained for the procedure. The shoulder was prepped with iodine. Using a 25 gauge needle the subacromial bursa was injected with 5 mL 1% lidocaine and 1 mL of triamcinolone (KENALOG) 40mg /ml. The needle was removed and a dressing was applied.  Complications:  None; patient tolerated the procedure well.

## 2017-04-07 NOTE — Assessment & Plan Note (Signed)
Patient with h/o ischemic CVA with >7052mo h/o intermittent pressure-like chest pain associated w/ occasional SOB, palpitations, nausea, diaphoresis concerning for cardiac pain. She does have other confounding GI factors and MSK factors (though producing sharp, reproducible CP) that could be contributing, however with h/o ischemic stroke CAD needs to be evaluated with stress testing. She has been off of asa and statin for quite some time.  EKG in clinic - personally reviewed - sinus rhythm, occasional PVCs, no q waves, no ST elevation or TWIs.  She does have h/o one low TSH value.  Plan: --CXR --TSH and free T4 - wnl --CBC - unremarkable --Cmet - normal electrolytes, renal function, hepatic function --referral to cards for stress testing --restart ASA 81mg  daily and atorvastatin 40mg  daily

## 2017-04-07 NOTE — Assessment & Plan Note (Signed)
Patient has chronic RUE pain which previously she reported as intermittent and relieved by naproxen with last visit with me, however today she states it is constant, has never been relieved by anything. She denies injury to the arm. She points to anterior right arm ~ 3in distal from humoral head as the location for the pain. She endorses intermittent swelling of this area. She endorses pain with any motion of her arm. She denies weakness, numbness/tingling. She endorses associated shooting, sharp chest pain, exacerbated by arm movement and cough (this is different from her intermittent pressure-like chest pain).  On exam, she has point tenderness at indicated location on upper arm w/o swelling or erythema. ROM limited by pain. Strength and sensation intact. She has signs and symptoms of both impingement and possible tear.  With her epigastric pain and possible untreated H pylori infection, I am hesitant to give more NSAIDs at this time, until further eval is done. Patient was in agreement with corticosteroid joint injection.  Plan: --subacromial kenalog injection - improvement in symptoms and ROM following procedure

## 2017-04-08 ENCOUNTER — Other Ambulatory Visit: Payer: Self-pay

## 2017-04-08 ENCOUNTER — Ambulatory Visit (HOSPITAL_COMMUNITY)
Admission: RE | Admit: 2017-04-08 | Discharge: 2017-04-08 | Disposition: A | Discharge status: Home / Self Care | Payer: No Typology Code available for payment source | Source: Ambulatory Visit | Attending: Internal Medicine | Admitting: Internal Medicine

## 2017-04-08 DIAGNOSIS — R079 Chest pain, unspecified: Secondary | ICD-10-CM

## 2017-04-08 DIAGNOSIS — I517 Cardiomegaly: Secondary | ICD-10-CM | POA: Insufficient documentation

## 2017-04-08 NOTE — Progress Notes (Signed)
Internal Medicine Clinic Attending  I saw and evaluated the patient.  I personally confirmed the key portions of the history and exam documented by Dr. Svalina and I reviewed pertinent patient test results.  The assessment, diagnosis, and plan were formulated together and I agree with the documentation in the resident's note.  I was present for the entire procedure.  

## 2017-04-10 LAB — H. PYLORI ANTIGEN, STOOL: H PYLORI AG STL: POSITIVE — AB

## 2017-04-12 MED ORDER — AMOXICILLIN 500 MG PO TABS: 1000.0000 mg | ORAL_TABLET | Freq: Two times a day (BID) | ORAL | 0 refills | Status: AC

## 2017-04-12 MED ORDER — OMEPRAZOLE 20 MG PO CPDR: 20.0000 mg | DELAYED_RELEASE_CAPSULE | Freq: Two times a day (BID) | ORAL | 0 refills | Status: DC

## 2017-04-12 MED ORDER — LEVOFLOXACIN 500 MG PO TABS: 500.0000 mg | ORAL_TABLET | Freq: Every day | ORAL | 0 refills | Status: AC

## 2017-04-12 MED FILL — OMEPRAZOLE 20 MG CAP: 20 | 14 days supply | Qty: 28 | Fill #0

## 2017-04-12 MED FILL — AMOXICILLIN 500 MG CAPSULE: 500 | 14 days supply | Qty: 56 | Fill #0

## 2017-04-12 MED FILL — levoFLOXacin 500 MG TABS: 500 | 14 days supply | Qty: 14 | Fill #0

## 2017-04-12 NOTE — Addendum Note (Signed)
Addended by: Nyra MarketSVALINA, Mckoy Bhakta on: 04/12/2017 04:27 PM   Modules accepted: Orders

## 2017-04-13 ENCOUNTER — Telehealth: Payer: Self-pay | Admitting: *Deleted

## 2017-04-13 NOTE — Telephone Encounter (Signed)
-----   Message from Nyra MarketGorica Svalina, MD sent at 04/12/2017  4:21 PM EST ----- Please call patient with [place] interpreter; I attempted without success today and left message for her to call clinic back.  She still has a stomach infection with H pylori. She will need a 2 week treatment with the following medications: Levofloxacin 500mg  (one pill) once daily Amoxacillin 1000mg  (two pills) twice daily Omeprazole 20mg  (one pill) twice daily before meals  I have sent the prescriptions to [facility], patient may not know where this is. It will be provided for $4 each under the IM program.   Thank you! Candida PeelingGorica

## 2017-04-13 NOTE — Telephone Encounter (Signed)
(  [employer] [phone] option #6) Attempted to contact pt with the assistance of [facility] [employer] [id number].  No answer-had interpreter leave message on recorder that [facility] is trying to contact pt regarding a prescription.  Will attempt call again this afternoon.Criss AlvineGoldston, Darlene Cassady2/13/20199:29 AM

## 2017-04-14 NOTE — Telephone Encounter (Signed)
Pt contacted with the help of [employer] (ID# [id number])-confirmed date of birth and informed pt that she still has a stomach infection and MD has sent three prescriptions [facility].  [place] in [place], but will have her dtr pick up prescriptions for her.  With the help of interpreter, pt verbalized instructions .  No further action needed, phone call complete.Criss AlvineGoldston, Alvis Edgell Cassady2/14/201910:50 AM

## 2017-04-15 ENCOUNTER — Ambulatory Visit (INDEPENDENT_AMBULATORY_CARE_PROVIDER_SITE_OTHER): Payer: No Typology Code available for payment source | Admitting: Cardiology

## 2017-04-15 ENCOUNTER — Encounter: Payer: Self-pay | Admitting: Cardiology

## 2017-04-15 VITALS — BP 132/78 | HR 85 | Ht 65.0 in | Wt 191.1 lb

## 2017-04-15 DIAGNOSIS — R079 Chest pain, unspecified: Secondary | ICD-10-CM

## 2017-04-15 DIAGNOSIS — Z8673 Personal history of transient ischemic attack (TIA), and cerebral infarction without residual deficits: Secondary | ICD-10-CM

## 2017-04-15 DIAGNOSIS — I209 Angina pectoris, unspecified: Secondary | ICD-10-CM | POA: Insufficient documentation

## 2017-04-15 MED ORDER — NITROGLYCERIN 0.4 MG SL SUBL: 0.4000 mg | SUBLINGUAL_TABLET | SUBLINGUAL | 6 refills | Status: DC | PRN
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
## 2017-04-15 NOTE — Patient Instructions (Addendum)
Medication Instructions:  Your physician has recommended you make the following change in your medication:  START Nitroglycerin 0.4 mg sublingual (under your tongue) as needed for chest pain. If experiencing chest pain, stop what you are doing and sit down. Take 1 nitroglycerin and wait 5 minutes. If chest pain continues, take another nitroglycerin and wait 5 minutes. If chest pain does not subside, take 1 more nitroglycerin and dial 911. You make take a total of 3 nitroglycerin in a 15 minute time frame.  Labwork: Your physician recommends that you have the following labs drawn: BMP and INR  Testing/Procedures:   [facility] [address], [address] [place] [address] Dept: [phone] Loc: [phone]  Otto HerbZahra Spargo  04/15/2017  You are scheduled for a Cardiac Catheterization on Tuesday, February 19 with Dr. Peter SwazilandJordan.  1. Please [facility] the [facility]: [address] [place], [place] [address] at 11:00 AM (two hours before your procedure to ensure your preparation). Free valet parking service is available.   Special note: Every effort is made to have your procedure done on time. Please understand that emergencies sometimes delay scheduled procedures.  2. Diet: Do not eat or drink anything after midnight prior to your procedure except sips of water to take medications.  3. Labs: Done on 02/15.  4. Medication instructions in preparation for your procedure:   On the morning of your procedure, take your Aspirin and any morning medicines NOT listed above.  You may use sips of water.  5. Plan for one night stay--bring personal belongings. 6. Bring a current list of your medications and current insurance cards. 7. You MUST have a responsible person to drive you home. 8. Someone MUST be with you the first 24 hours after you arrive home or your discharge  will be delayed. 9. Please wear clothes that are easy to get on and off and wear slip-on shoes.  Thank you for allowing us to care for you!   -- [facility]   Follow-Up: 1 month  Any Other Special Instructions Will Be Listed Below (If Applicable).     If you need a refill on your cardiac medications before your next appointment, please call your pharmacy.   [facility]  Garey HamAshley A, RN, BSN
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
## 2017-04-18 ENCOUNTER — Telehealth: Payer: Self-pay | Admitting: *Deleted

## 2017-04-18 LAB — BASIC METABOLIC PANEL
BUN/Creatinine Ratio: 19 (ref 9–23)
BUN: 13 mg/dL (ref 6–24)
CALCIUM: 9.2 mg/dL (ref 8.7–10.2)
CHLORIDE: 102 mmol/L (ref 96–106)
CO2: 23 mmol/L (ref 20–29)
Creatinine, Ser: 0.68 mg/dL (ref 0.57–1.00)
GFR calc Af Amer: 115 mL/min/{1.73_m2} (ref >59)
GFR, EST NON AFRICAN AMERICAN: 100 mL/min/{1.73_m2} (ref >59)
GLUCOSE: 131 mg/dL — AB (ref 65–99)
POTASSIUM: 5.1 mmol/L (ref 3.5–5.2)
SODIUM: 143 mmol/L (ref 134–144)

## 2017-04-18 LAB — PROTIME-INR
INR: 1.1 (ref 0.8–1.2)
Prothrombin Time: 11.1 s (ref 9.1–12.0)

## 2017-04-18 NOTE — Telephone Encounter (Signed)
I spoke with patient's daughter, Jovita KussmaulMagda, with patient's verbal permission. I reviewed cath instructions with Magda, she verbalized understanding, thanked me for call.

## 2017-04-18 NOTE — Telephone Encounter (Signed)
[facility] for: Tuesday February 19,2019 at 1:30 PM Arrival time and place: [facility] Main Entr[facility] A/[facility] Tower at: 11:30 AM   AM meds can be  taken pre-cath with sip of water including: ASA 81 mg am of cath   Patient has responsible person to drive home post procedure and observe patient for 24 hours  I was able to speak with patient's daughter, Malachi Bondsda,  through [employer], [place], [place] [id number]. Pt was not at home, should be home around 12 noon, I call back after that time, left my name and phone number with patient's daughter.

## 2017-04-19 ENCOUNTER — Ambulatory Visit (HOSPITAL_COMMUNITY)
Admission: RE | Admit: 2017-04-19 | Discharge: 2017-04-19 | Disposition: A | Discharge status: Home / Self Care | Payer: No Typology Code available for payment source | Source: Ambulatory Visit | Attending: Cardiology | Admitting: Cardiology

## 2017-04-19 ENCOUNTER — Ambulatory Visit (HOSPITAL_COMMUNITY)
Admission: RE | Disposition: A | Discharge status: Home / Self Care | Payer: Self-pay | Source: Ambulatory Visit | Attending: Cardiology

## 2017-04-19 DIAGNOSIS — Z7982 Long term (current) use of aspirin: Secondary | ICD-10-CM | POA: Insufficient documentation

## 2017-04-19 DIAGNOSIS — Z6831 Body mass index (BMI) 31.0-31.9, adult: Secondary | ICD-10-CM | POA: Insufficient documentation

## 2017-04-19 DIAGNOSIS — I209 Angina pectoris, unspecified: Secondary | ICD-10-CM | POA: Diagnosis present

## 2017-04-19 DIAGNOSIS — Z8673 Personal history of transient ischemic attack (TIA), and cerebral infarction without residual deficits: Secondary | ICD-10-CM | POA: Insufficient documentation

## 2017-04-19 DIAGNOSIS — K219 Gastro-esophageal reflux disease without esophagitis: Secondary | ICD-10-CM | POA: Insufficient documentation

## 2017-04-19 DIAGNOSIS — E669 Obesity, unspecified: Secondary | ICD-10-CM | POA: Insufficient documentation

## 2017-04-19 DIAGNOSIS — R0789 Other chest pain: Secondary | ICD-10-CM | POA: Insufficient documentation

## 2017-04-19 DIAGNOSIS — E785 Hyperlipidemia, unspecified: Secondary | ICD-10-CM | POA: Insufficient documentation

## 2017-04-19 HISTORY — PX: LEFT HEART CATH AND CORONARY ANGIOGRAPHY: CATH118249

## 2017-04-19 LAB — CBC
HCT: 41.1 % (ref 36.0–46.0)
Hemoglobin: 13.4 g/dL (ref 12.0–15.0)
MCH: 28.5 pg (ref 26.0–34.0)
MCHC: 32.6 g/dL (ref 30.0–36.0)
MCV: 87.4 fL (ref 78.0–100.0)
PLATELETS: 255 10*3/uL (ref 150–400)
RBC: 4.7 MIL/uL (ref 3.87–5.11)
RDW: 12.6 % (ref 11.5–15.5)
WBC: 8.8 10*3/uL (ref 4.0–10.5)

## 2017-04-19 SURGERY — LEFT HEART CATH AND CORONARY ANGIOGRAPHY
Anesthesia: LOCAL

## 2017-04-19 MED ORDER — SODIUM CHLORIDE 0.9 % WEIGHT BASED INFUSION: 1.0000 mL/kg/h | INTRAVENOUS | Status: DC

## 2017-04-19 MED ORDER — HEPARIN (PORCINE) IN NACL 2-0.9 UNIT/ML-% IJ SOLN
INTRAMUSCULAR | Status: AC
  Filled 2017-04-19: qty 1000

## 2017-04-19 MED ORDER — VERAPAMIL HCL 2.5 MG/ML IV SOLN
INTRAVENOUS | Status: DC | PRN
  Administered 2017-04-19 (×2): via INTRA_ARTERIAL

## 2017-04-19 MED ORDER — SODIUM CHLORIDE 0.9 % IV SOLN: 250.0000 mL | INTRAVENOUS | Status: DC | PRN

## 2017-04-19 MED ORDER — IOPAMIDOL (ISOVUE-370) INJECTION 76%
INTRAVENOUS | Status: AC
  Filled 2017-04-19: qty 50

## 2017-04-19 MED ORDER — SODIUM CHLORIDE 0.9% FLUSH: 3.0000 mL | INTRAVENOUS | Status: DC | PRN

## 2017-04-19 MED ORDER — ACETAMINOPHEN 325 MG PO TABS
650.0000 mg | ORAL_TABLET | Freq: Once | ORAL | Status: AC
  Administered 2017-04-19: 650 mg via ORAL

## 2017-04-19 MED ORDER — HEPARIN (PORCINE) IN NACL 2-0.9 UNIT/ML-% IJ SOLN
INTRAMUSCULAR | Status: AC | PRN
  Administered 2017-04-19 (×2): 500 mL via INTRA_ARTERIAL

## 2017-04-19 MED ORDER — HEPARIN SODIUM (PORCINE) 1000 UNIT/ML IJ SOLN
INTRAMUSCULAR | Status: DC | PRN
  Administered 2017-04-19: 4000 [IU] via INTRAVENOUS

## 2017-04-19 MED ORDER — MIDAZOLAM HCL 2 MG/2ML IJ SOLN
INTRAMUSCULAR | Status: DC | PRN
  Administered 2017-04-19: 1 mg via INTRAVENOUS

## 2017-04-19 MED ORDER — LIDOCAINE HCL (PF) 1 % IJ SOLN
INTRAMUSCULAR | Status: AC
  Filled 2017-04-19: qty 30

## 2017-04-19 MED ORDER — SODIUM CHLORIDE 0.9% FLUSH: 3.0000 mL | Freq: Two times a day (BID) | INTRAVENOUS | Status: DC

## 2017-04-19 MED ORDER — VERAPAMIL HCL 2.5 MG/ML IV SOLN
INTRAVENOUS | Status: AC
  Filled 2017-04-19: qty 2

## 2017-04-19 MED ORDER — LIDOCAINE HCL (PF) 1 % IJ SOLN
INTRAMUSCULAR | Status: DC | PRN
  Administered 2017-04-19: 2 mL

## 2017-04-19 MED ORDER — HEPARIN SODIUM (PORCINE) 1000 UNIT/ML IJ SOLN
INTRAMUSCULAR | Status: AC
  Filled 2017-04-19: qty 1

## 2017-04-19 MED ORDER — SODIUM CHLORIDE 0.9 % IV SOLN
250.0000 mL | INTRAVENOUS | Status: DC | PRN
Start: 2017-04-19 — End: 2017-04-19

## 2017-04-19 MED ORDER — SODIUM CHLORIDE 0.9 % WEIGHT BASED INFUSION
3.0000 mL/kg/h | INTRAVENOUS | Status: AC
  Administered 2017-04-19: 3 mL/kg/h via INTRAVENOUS

## 2017-04-19 MED ORDER — ASPIRIN 81 MG PO CHEW: 81.0000 mg | CHEWABLE_TABLET | ORAL | Status: DC

## 2017-04-19 MED ORDER — IOPAMIDOL (ISOVUE-370) INJECTION 76%
INTRAVENOUS | Status: DC | PRN
  Administered 2017-04-19: 57 mL via INTRA_ARTERIAL

## 2017-04-19 MED ORDER — FENTANYL CITRATE (PF) 100 MCG/2ML IJ SOLN
INTRAMUSCULAR | Status: AC
  Filled 2017-04-19: qty 2

## 2017-04-19 MED ORDER — MIDAZOLAM HCL 2 MG/2ML IJ SOLN
INTRAMUSCULAR | Status: AC
  Filled 2017-04-19: qty 2

## 2017-04-19 MED ORDER — FENTANYL CITRATE (PF) 100 MCG/2ML IJ SOLN
INTRAMUSCULAR | Status: DC | PRN
  Administered 2017-04-19: 25 ug via INTRAVENOUS

## 2017-04-19 MED ORDER — ACETAMINOPHEN 325 MG PO TABS
ORAL_TABLET | ORAL | Status: AC
  Filled 2017-04-19: qty 2

## 2017-04-19 SURGICAL SUPPLY — 12 items
CATH 5FR JL3.5 JR4 ANG PIG MP (CATHETERS) ×2 IMPLANT
COVER PRB 48X5XTLSCP FOLD TPE (BAG) ×1 IMPLANT
COVER PROBE 5X48 (BAG) ×1
DEVICE RAD COMP TR BAND LRG (VASCULAR PRODUCTS) ×2 IMPLANT
GLIDESHEATH SLEND SS 6F .021 (SHEATH) ×2 IMPLANT
GUIDEWIRE INQWIRE 1.5J.035X260 (WIRE) ×1 IMPLANT
INQWIRE 1.5J .035X260CM (WIRE) ×2
KIT HEART LEFT (KITS) ×2 IMPLANT
KIT PREMIUM HAND CONTROLLER (KITS) ×2 IMPLANT
KIT SINGLE USE MANIFOLD (KITS) ×2 IMPLANT
PACK CARDIAC CATHETERIZATION (CUSTOM PROCEDURE TRAY) ×2 IMPLANT
TRANSDUCER W/STOPCOCK (MISCELLANEOUS) ×2 IMPLANT

## 2017-04-19 NOTE — Discharge Instructions (Signed)

## 2017-04-19 NOTE — Interval H&P Note (Signed)
History and Physical Interval Note:  04/19/2017 1:06 PM  Abigail Butler  has presented today for surgery, with the diagnosis of cp  The various methods of treatment have been discussed with the patient and family. After consideration of risks, benefits and other options for treatment, the patient has consented to  Procedure(s): LEFT HEART CATH AND CORONARY ANGIOGRAPHY (N/A) as a surgical intervention .  The patient's history has been reviewed, patient examined, no change in status, stable for surgery.  I have reviewed the patient's chart and labs.  Questions were answered to the patient's satisfaction.    Cath Lab Visit (complete for each Cath Lab visit)  Clinical Evaluation Leading to the Procedure:   ACS: No.  Non-ACS:    Anginal Classification: CCS II  Anti-ischemic medical therapy: No Therapy  Non-Invasive Test Results: No non-invasive testing performed  Prior CABG: No previous CABG       Theron Aristaeter [facility]JordanMD,FACC 04/19/2017 1:06 PM

## 2017-04-20 ENCOUNTER — Encounter (HOSPITAL_COMMUNITY): Payer: Self-pay | Admitting: Cardiology

## 2017-04-20 MED FILL — Heparin Sodium (Porcine) 2 Unit/ML in Sodium Chloride 0.9%: INTRAMUSCULAR | Qty: 1000 | Status: AC

## 2017-04-27 ENCOUNTER — Encounter: Payer: Self-pay | Admitting: Internal Medicine
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
## 2017-05-11 ENCOUNTER — Other Ambulatory Visit: Payer: Self-pay

## 2017-05-11 ENCOUNTER — Ambulatory Visit (INDEPENDENT_AMBULATORY_CARE_PROVIDER_SITE_OTHER): Payer: No Typology Code available for payment source | Admitting: Internal Medicine

## 2017-05-11 ENCOUNTER — Encounter: Payer: Self-pay | Admitting: Internal Medicine

## 2017-05-11 VITALS — BP 144/59 | HR 80 | Temp 98.1°F | Ht 65.0 in | Wt 192.0 lb

## 2017-05-11 DIAGNOSIS — B9681 Helicobacter pylori [H. pylori] as the cause of diseases classified elsewhere: Secondary | ICD-10-CM

## 2017-05-11 DIAGNOSIS — Z8611 Personal history of tuberculosis: Secondary | ICD-10-CM

## 2017-05-11 DIAGNOSIS — R058 Other specified cough: Secondary | ICD-10-CM

## 2017-05-11 DIAGNOSIS — I10 Essential (primary) hypertension: Secondary | ICD-10-CM

## 2017-05-11 DIAGNOSIS — R05 Cough: Secondary | ICD-10-CM

## 2017-05-11 DIAGNOSIS — I6930 Unspecified sequelae of cerebral infarction: Secondary | ICD-10-CM

## 2017-05-11 DIAGNOSIS — R0789 Other chest pain: Secondary | ICD-10-CM

## 2017-05-11 DIAGNOSIS — A048 Other specified bacterial intestinal infections: Secondary | ICD-10-CM

## 2017-05-11 DIAGNOSIS — K2970 Gastritis, unspecified, without bleeding: Secondary | ICD-10-CM

## 2017-05-11 HISTORY — DX: Essential (primary) hypertension: I10

## 2017-05-11 MED ORDER — HYDROCHLOROTHIAZIDE 12.5 MG PO CAPS: 12.5000 mg | ORAL_CAPSULE | Freq: Every day | ORAL | 2 refills | Status: DC

## 2017-05-11 MED ORDER — FLUTICASONE PROPIONATE 50 MCG/ACT NA SUSP: 1.0000 | Freq: Every day | NASAL | 2 refills | Status: DC

## 2017-05-11 MED ORDER — LORATADINE 10 MG PO TABS: 10.0000 mg | ORAL_TABLET | Freq: Every day | ORAL | 2 refills | Status: DC

## 2017-05-11 NOTE — Assessment & Plan Note (Addendum)
Presumptively she was previously treated but failed; H pylori Ag was positive at last visit (epigastric discomfort) and she was prescribed levofloxacin triple therapy (levoflox 500mg  daily, omeprazole 20mg  IBD, and amoxacillin 1000mg  BIDx14 days). She states compliance with medications after picking them up 1 wk ago however unable to state meds, frequency, and forgot to bring them to appt today.    She endorses resolution of abd pain.  Plan: --f/u in 1 month which will give enough time to complete course and have her be off of PPI for 2 wks so that stool H pylori antigen testing could be ordered at that visit --asked patient to bring in all of her medicines; if she bring PPI and endorses taking it, please advise to stop for 2 wks before providing stool sample

## 2017-05-11 NOTE — Patient Instructions (Addendum)
For your runny nose and cough, start taking claratin one pill daily and start using flonase (or similar nasal steroid) daily.   Please complete your course of medications for your stomach.  Your blood pressure was high for the last couple of visits so we will start a daily blood pressure medicine called hydrochlorothiazide. Please take one pill once a day; [facility].  Please come back in 1 month for blood pressure follow up and bring all your medications with you.

## 2017-05-13 ENCOUNTER — Ambulatory Visit: Payer: No Typology Code available for payment source | Admitting: Cardiology

## 2017-05-15 ENCOUNTER — Encounter: Payer: Self-pay | Admitting: Internal Medicine

## 2017-05-15 NOTE — Assessment & Plan Note (Signed)
Patient with persistently elevated blood pressure readings across different visits with h/o ischemic CVA. Will go ahead and start antihypertensive in this higher risk patient.  Plan: --start HCTZ 12.5mg  daily ([facility]) --f/u in 1 month with medication; will get Bmet at that time

## 2017-05-15 NOTE — Assessment & Plan Note (Signed)
Patient seen at last visit for CP with cough and shortness of breath. Due to high risk with atypical chest pain she was referred to cardiology who performed LHC - this revealed no CAD. Today she states CP and SOB is resolved, however she has a persistent nonproductive cough. [facility] which reveal she completed treatment for Latent TB in 2009.   Today her symptoms of nasal congestion, rhinorrhea, post-nasal drainage and worse symptoms at night point to allergic rhinitis as etiology for her cough. She also has H pylori gastritis which could exacerbate acid reflux and exacerbate night-time cough with pronation.   Plan: --advised to take oral, nondrowsy anti-histamine daily, steroid nasal spray daily  --continue addressing H pylori infection

## 2017-05-16 NOTE — Progress Notes (Signed)
Internal Medicine Clinic Attending  Case discussed with Dr. Svalina  at the time of the visit.  We reviewed the resident's history and exam and pertinent patient test results.  I agree with the assessment, diagnosis, and plan of care documented in the resident's note.  

## 2017-05-18 ENCOUNTER — Ambulatory Visit (INDEPENDENT_AMBULATORY_CARE_PROVIDER_SITE_OTHER): Payer: No Typology Code available for payment source | Admitting: Cardiology

## 2017-05-18 ENCOUNTER — Encounter: Payer: Self-pay | Admitting: Cardiology

## 2017-05-18 VITALS — BP 138/62 | HR 85 | Ht 65.0 in | Wt 191.0 lb

## 2017-05-18 DIAGNOSIS — I10 Essential (primary) hypertension: Secondary | ICD-10-CM
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
## 2017-05-18 NOTE — Patient Instructions (Signed)

## 2017-05-26 ENCOUNTER — Ambulatory Visit: Payer: No Typology Code available for payment source

## 2017-06-02 ENCOUNTER — Ambulatory Visit: Payer: No Typology Code available for payment source

## 2017-08-18 ENCOUNTER — Ambulatory Visit: Payer: No Typology Code available for payment source

## 2017-08-18 ENCOUNTER — Encounter: Payer: Self-pay | Admitting: Internal Medicine

## 2017-11-18 ENCOUNTER — Ambulatory Visit: Payer: Self-pay

## 2017-11-25 ENCOUNTER — Ambulatory Visit: Payer: Self-pay

## 2017-12-02 ENCOUNTER — Ambulatory Visit: Payer: Self-pay

## 2017-12-08 ENCOUNTER — Ambulatory Visit (INDEPENDENT_AMBULATORY_CARE_PROVIDER_SITE_OTHER): Payer: Self-pay | Admitting: Internal Medicine

## 2017-12-08 DIAGNOSIS — K0510 Chronic gingivitis, plaque induced: Secondary | ICD-10-CM

## 2017-12-08 HISTORY — DX: Chronic gingivitis, plaque induced: K05.10
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
## 2017-12-08 NOTE — Patient Instructions (Signed)
Ms. Bozich,  It was a pleasure to meet you. I have placed a referral for you to see a dentist. If you have any questions or concerns, call our clinic at [phone] or after hours call [phone] and ask for the internal medicine resident on call. Thank you!  Dr. Antony Contras

## 2017-12-08 NOTE — Assessment & Plan Note (Signed)
Patient is here with complaint of painful and bleeding gums.  She denies issues with chewing or eating food.  On exam all of her teeth are intact.  She does have some areas of receding gumline.  But no evidence of infection or abscess.  Overall consistent with gingivitis. --Referral to dentistry

## 2017-12-09 NOTE — Progress Notes (Signed)
Internal Medicine Clinic Attending  Case discussed with Dr. Guilloud at the time of the visit.  We reviewed the resident's history and exam and pertinent patient test results.  I agree with the assessment, diagnosis, and plan of care documented in the resident's note.  

## 2018-03-13 ENCOUNTER — Encounter: Payer: Self-pay | Admitting: Internal Medicine

## 2018-03-15 ENCOUNTER — Ambulatory Visit: Payer: Self-pay

## 2018-03-22 ENCOUNTER — Ambulatory Visit: Payer: Self-pay

## 2018-04-20 ENCOUNTER — Ambulatory Visit: Payer: Self-pay

## 2018-04-25 ENCOUNTER — Encounter: Payer: Self-pay | Admitting: Internal Medicine

## 2018-07-30 ENCOUNTER — Encounter: Payer: Self-pay | Admitting: *Deleted

## 2018-11-15 ENCOUNTER — Ambulatory Visit: Payer: Self-pay

## 2018-12-11 ENCOUNTER — Ambulatory Visit: Payer: Self-pay

## 2018-12-27 ENCOUNTER — Other Ambulatory Visit: Payer: Self-pay

## 2018-12-27 ENCOUNTER — Ambulatory Visit: Payer: Self-pay

## 2018-12-27 ENCOUNTER — Ambulatory Visit (INDEPENDENT_AMBULATORY_CARE_PROVIDER_SITE_OTHER): Payer: Self-pay | Admitting: *Deleted

## 2018-12-27 DIAGNOSIS — Z23 Encounter for immunization: Secondary | ICD-10-CM

## 2019-11-28 ENCOUNTER — Ambulatory Visit: Payer: Self-pay

## 2019-12-17 ENCOUNTER — Ambulatory Visit: Payer: Self-pay

## 2020-04-03 ENCOUNTER — Encounter: Payer: Self-pay | Admitting: Internal Medicine

## 2020-10-20 ENCOUNTER — Encounter: Payer: Self-pay | Admitting: Internal Medicine

## 2021-05-25 ENCOUNTER — Other Ambulatory Visit: Payer: Self-pay

## 2021-05-25 ENCOUNTER — Emergency Department (HOSPITAL_COMMUNITY): Payer: Self-pay

## 2021-05-25 ENCOUNTER — Inpatient Hospital Stay (HOSPITAL_COMMUNITY)
Admission: EM | Admit: 2021-05-25 | Discharge: 2021-06-03 | DRG: 310 | Disposition: A | Discharge status: Home / Self Care | Payer: Self-pay | Source: Ambulatory Visit | Attending: Internal Medicine | Admitting: Internal Medicine

## 2021-05-25 ENCOUNTER — Ambulatory Visit (HOSPITAL_COMMUNITY)
Admit: 2021-05-25 | Discharge: 2021-05-25 | Disposition: A | Discharge status: Home / Self Care | Payer: Medicaid Other | Attending: Internal Medicine | Admitting: Internal Medicine

## 2021-05-25 ENCOUNTER — Encounter (HOSPITAL_COMMUNITY): Payer: Self-pay | Admitting: Emergency Medicine

## 2021-05-25 ENCOUNTER — Ambulatory Visit (INDEPENDENT_AMBULATORY_CARE_PROVIDER_SITE_OTHER): Payer: Self-pay | Admitting: Internal Medicine

## 2021-05-25 DIAGNOSIS — R5383 Other fatigue: Secondary | ICD-10-CM

## 2021-05-25 DIAGNOSIS — E878 Other disorders of electrolyte and fluid balance, not elsewhere classified: Secondary | ICD-10-CM

## 2021-05-25 DIAGNOSIS — I052 Rheumatic mitral stenosis with insufficiency: Secondary | ICD-10-CM | POA: Diagnosis present

## 2021-05-25 DIAGNOSIS — Z8673 Personal history of transient ischemic attack (TIA), and cerebral infarction without residual deficits: Secondary | ICD-10-CM

## 2021-05-25 DIAGNOSIS — R7303 Prediabetes: Secondary | ICD-10-CM | POA: Diagnosis present

## 2021-05-25 DIAGNOSIS — Z8615 Personal history of latent tuberculosis infection: Secondary | ICD-10-CM

## 2021-05-25 DIAGNOSIS — I4891 Unspecified atrial fibrillation: Secondary | ICD-10-CM

## 2021-05-25 DIAGNOSIS — I10 Essential (primary) hypertension: Secondary | ICD-10-CM

## 2021-05-25 DIAGNOSIS — I509 Heart failure, unspecified: Secondary | ICD-10-CM

## 2021-05-25 DIAGNOSIS — I4430 Unspecified atrioventricular block: Secondary | ICD-10-CM | POA: Diagnosis present

## 2021-05-25 DIAGNOSIS — I4892 Unspecified atrial flutter: Principal | ICD-10-CM

## 2021-05-25 DIAGNOSIS — I959 Hypotension, unspecified: Secondary | ICD-10-CM | POA: Diagnosis not present

## 2021-05-25 DIAGNOSIS — Z8619 Personal history of other infectious and parasitic diseases: Secondary | ICD-10-CM

## 2021-05-25 DIAGNOSIS — R000 Tachycardia, unspecified: Secondary | ICD-10-CM

## 2021-05-25 DIAGNOSIS — R1013 Epigastric pain: Secondary | ICD-10-CM | POA: Diagnosis present

## 2021-05-25 HISTORY — DX: Unspecified atrial flutter: I48.92

## 2021-05-25 LAB — COMPREHENSIVE METABOLIC PANEL
ALT: 47 U/L — ABNORMAL HIGH (ref 0–44)
AST: 36 U/L (ref 15–41)
Albumin: 3.7 g/dL (ref 3.5–5.0)
Alkaline Phosphatase: 74 U/L (ref 38–126)
Anion gap: 6 (ref 5–15)
BUN: 9 mg/dL (ref 6–20)
CO2: 23 mmol/L (ref 22–32)
Calcium: 9.1 mg/dL (ref 8.9–10.3)
Chloride: 110 mmol/L (ref 98–111)
Creatinine, Ser: 0.75 mg/dL (ref 0.44–1.00)
GFR, Estimated: >60 mL/min (ref >60)
Glucose, Bld: 122 mg/dL — ABNORMAL HIGH (ref 70–99)
Potassium: 3.7 mmol/L (ref 3.5–5.1)
Sodium: 139 mmol/L (ref 135–145)
Total Bilirubin: 0.4 mg/dL (ref 0.3–1.2)
Total Protein: 7.5 g/dL (ref 6.5–8.1)

## 2021-05-25 LAB — HEMOGLOBIN A1C
Hgb A1c MFr Bld: 5.9 % — ABNORMAL HIGH (ref 4.8–5.6)
Mean Plasma Glucose: 122.63 mg/dL

## 2021-05-25 LAB — BRAIN NATRIURETIC PEPTIDE: B Natriuretic Peptide: 511.1 pg/mL — ABNORMAL HIGH (ref 0.0–100.0)

## 2021-05-25 LAB — CBC WITH DIFFERENTIAL/PLATELET
Abs Immature Granulocytes: 0.03 10*3/uL (ref 0.00–0.07)
Basophils Absolute: 0.1 10*3/uL (ref 0.0–0.1)
Basophils Relative: 1 %
Eosinophils Absolute: 0.2 10*3/uL (ref 0.0–0.5)
Eosinophils Relative: 2 %
HCT: 40.7 % (ref 36.0–46.0)
Hemoglobin: 12.8 g/dL (ref 12.0–15.0)
Immature Granulocytes: 0 %
Lymphocytes Relative: 43 %
Lymphs Abs: 4.1 10*3/uL — ABNORMAL HIGH (ref 0.7–4.0)
MCH: 27.8 pg (ref 26.0–34.0)
MCHC: 31.4 g/dL (ref 30.0–36.0)
MCV: 88.3 fL (ref 80.0–100.0)
Monocytes Absolute: 0.9 10*3/uL (ref 0.1–1.0)
Monocytes Relative: 10 %
Neutro Abs: 4.1 10*3/uL (ref 1.7–7.7)
Neutrophils Relative %: 44 %
Platelets: 278 10*3/uL (ref 150–400)
RBC: 4.61 MIL/uL (ref 3.87–5.11)
RDW: 12.6 % (ref 11.5–15.5)
WBC: 9.4 10*3/uL (ref 4.0–10.5)
nRBC: 0 % (ref 0.0–0.2)

## 2021-05-25 LAB — TSH: TSH: 0.862 u[IU]/mL (ref 0.350–4.500)

## 2021-05-25 LAB — TROPONIN I (HIGH SENSITIVITY)
Troponin I (High Sensitivity): 12 ng/L (ref <18)
Troponin I (High Sensitivity): 13 ng/L (ref <18)

## 2021-05-25 MED ORDER — METOPROLOL TARTRATE 5 MG/5ML IV SOLN
5.0000 mg | Freq: Once | INTRAVENOUS | Status: AC
Start: 2021-05-25 — End: 2021-05-25
  Administered 2021-05-25: 5 mg via INTRAVENOUS
  Filled 2021-05-25: qty 5

## 2021-05-25 MED ORDER — DILTIAZEM HCL-DEXTROSE 125-5 MG/125ML-% IV SOLN (PREMIX)
5.0000 mg/h | INTRAVENOUS | Status: DC
  Administered 2021-05-25: 5 mg/h via INTRAVENOUS
  Administered 2021-05-26 (×2): 15 mg/h via INTRAVENOUS
  Administered 2021-05-27: 10 mg/h via INTRAVENOUS
  Administered 2021-05-27: 12.5 mg/h via INTRAVENOUS
  Administered 2021-05-28 (×3): 15 mg/h via INTRAVENOUS
  Administered 2021-05-29: 10 mg/h via INTRAVENOUS
  Filled 2021-05-25 (×10): qty 125

## 2021-05-25 MED ORDER — HEPARIN BOLUS VIA INFUSION
4500.0000 [IU] | Freq: Once | INTRAVENOUS | Status: AC
Start: 2021-05-25 — End: 2021-05-25
  Administered 2021-05-25: 4500 [IU] via INTRAVENOUS
  Filled 2021-05-25: qty 4500

## 2021-05-25 MED ORDER — ACETAMINOPHEN 325 MG PO TABS
650.0000 mg | ORAL_TABLET | ORAL | Status: DC | PRN
  Administered 2021-05-26 – 2021-05-30 (×4): 650 mg via ORAL
  Filled 2021-05-25 (×6): qty 2

## 2021-05-25 MED ORDER — SODIUM CHLORIDE 0.9 % IV BOLUS
1000.0000 mL | Freq: Once | INTRAVENOUS | Status: AC
  Administered 2021-05-25: 1000 mL via INTRAVENOUS

## 2021-05-25 MED ORDER — DILTIAZEM LOAD VIA INFUSION
20.0000 mg | Freq: Once | INTRAVENOUS | Status: AC
Start: 2021-05-25 — End: 2021-05-25
  Administered 2021-05-25: 20 mg via INTRAVENOUS
  Filled 2021-05-25: qty 20

## 2021-05-25 MED ORDER — HEPARIN (PORCINE) 25000 UT/250ML-% IV SOLN
1550.0000 [IU]/h | INTRAVENOUS | Status: DC
  Administered 2021-05-25: 1100 [IU]/h via INTRAVENOUS
  Administered 2021-05-27 – 2021-05-28 (×2): 1400 [IU]/h via INTRAVENOUS
  Administered 2021-05-28: 1550 [IU]/h via INTRAVENOUS
  Filled 2021-05-25 (×5): qty 250

## 2021-05-25 MED ORDER — ONDANSETRON HCL 4 MG/2ML IJ SOLN: 4.0000 mg | Freq: Four times a day (QID) | INTRAMUSCULAR | Status: DC | PRN

## 2021-05-25 NOTE — Patient Instructions (Signed)
Pt is transported to the ED for further management.  ?

## 2021-05-25 NOTE — ED Triage Notes (Signed)
Pt c/o chest pain and palpitations that started the day before yesterday. Pt with HR 150-170s. C/o lightheadedness. ?

## 2021-05-25 NOTE — ED Provider Triage Note (Signed)
Emergency Medicine Provider Triage Evaluation Note ? ?Abigail Butler , a 56 y.o. female  was evaluated in triage.  Pt complains of chest pain and palpitations.  Patient reports that her chest pain and palpitations started "the day before yesterday."  Patient endorses associated lightheadedness and shortness of breath.  No syncopal episodes.  Patient denies any history of similar episodes. ? ?Review of Systems  ?Positive: Chest pain, palpitations, shortness of breath, lightheadedness ?Negative: Leg swelling or tenderness, syncope ? ?Physical Exam  ?BP (!) 123/103   Pulse (!) 148   Temp 99 ?F (37.2 ?C) (Oral)   Resp 16   SpO2 95%  ?Gen:   Awake, no distress   ?Resp:  Normal effort  ?MSK:   Moves extremities without difficulty  ?Other:  +2 radial pulse bilaterally ? ?Medical Decision Making  ?Medically screening exam initiated at 3:14 PM.  Appropriate orders placed.  [name] was informed that the remainder of the evaluation will be completed by another provider, this initial triage assessment does not replace that evaluation, and the importance of remaining in the ED until their evaluation is complete. ? ?Patient noted to be tachycardic with heart rate ranging from 140s to 170s.  Patient made level 2 and is being moved back to next available room. ?  ?Haskel Schroeder, PA-C ?05/25/21 1515 ? ?

## 2021-05-25 NOTE — ED Provider Notes (Signed)
?[facility] ?Provider Note ? ? ?CSN: [id number] ?Arrival date & time: 05/25/21  1501 ? ?  ? ?History ? ?Chief Complaint  ?Patient presents with  ? Tachycardia  ? ? ?Abigail Butler is a 56 y.o. female. ? ?HPI ?Patient presenting for evaluation of general malaise, and potation's which have been present for 2 days.  No history of same.  She went to a clinic visit today and was referred here for tachyarrhythmia with period of a pregnancy on first EKG.  No history of atrial fibrillation.  No recent illnesses of vomiting, cough or shortness of breath ?  ? ?Home Medications ?Prior to Admission medications   ?Medication Sig Start Date End Date Taking? Authorizing Provider  ?acetaminophen (TYLENOL) 500 MG tablet Take 2 tablets (1,000 mg total) by mouth every 6 (six) hours as needed. For pain 05/31/13   Blain Pais, MD  ?atorvastatin (LIPITOR) 40 MG tablet Take 1 tablet (40 mg total) by mouth daily. 04/06/17 04/06/18  Alphonzo Grieve, MD  ?fluticasone (FLONASE) 50 MCG/ACT nasal spray Place 1 spray into both nostrils daily. 05/11/17 05/11/18  Alphonzo Grieve, MD  ?hydrochlorothiazide (MICROZIDE) 12.5 MG capsule Take 1 capsule (12.5 mg total) by mouth daily. 05/11/17 05/11/18  Alphonzo Grieve, MD  ?loratadine (CLARITIN) 10 MG tablet Take 1 tablet (10 mg total) by mouth daily. 05/11/17 05/11/18  Alphonzo Grieve, MD  ?nitroGLYCERIN (NITROSTAT) 0.4 MG SL tablet Place 1 tablet (0.4 mg total) under the tongue every 5 (five) minutes as needed. 04/15/17 07/14/17  Revankar, Reita Cliche, MD  ?omeprazole (PRILOSEC) 20 MG capsule Take 20 mg by mouth daily.    [provider]  ?   ? ?Allergies    ?Pork-derived products   ? ?Review of Systems   ?Review of Systems ? ?Physical Exam ?Updated Vital Signs ?BP 122/67   Pulse 87   Temp 99 ?F (37.2 ?C) (Oral)   Resp (!) 29   SpO2 95%  ?Physical Exam ?Vitals and nursing note reviewed.  ?Constitutional:   ?   Appearance: She is well-developed. She is not  ill-appearing.  ?HENT:  ?   Head: Normocephalic and atraumatic.  ?   Right Ear: External ear normal.  ?   Left Ear: External ear normal.  ?Eyes:  ?   Conjunctiva/sclera: Conjunctivae normal.  ?   Pupils: Pupils are equal, round, and reactive to light.  ?Neck:  ?   Trachea: Phonation normal.  ?Cardiovascular:  ?   Rate and Rhythm: Tachycardia present. Rhythm irregular.  ?Pulmonary:  ?   Effort: Pulmonary effort is normal. No respiratory distress.  ?   Breath sounds: Normal breath sounds. No stridor.  ?Abdominal:  ?   Palpations: Abdomen is soft.  ?   Tenderness: There is no abdominal tenderness.  ?Musculoskeletal:     ?   General: Normal range of motion.  ?   Cervical back: Normal range of motion and neck supple.  ?   Right lower leg: No edema.  ?   Left lower leg: No edema.  ?Skin: ?   General: Skin is warm and dry.  ?Neurological:  ?   Mental Status: She is alert and oriented to person, place, and time.  ?   Cranial Nerves: No cranial nerve deficit.  ?   Sensory: No sensory deficit.  ?   Motor: No abnormal muscle tone.  ?   Coordination: Coordination normal.  ?Psychiatric:     ?   Mood and Affect: Mood normal.     ?  Behavior: Behavior normal.     ?   Thought Content: Thought content normal.     ?   Judgment: Judgment normal.  ? ? ?ED Results / Procedures / Treatments   ?Labs ?(all labs ordered are listed, but only abnormal results are displayed) ?Labs Reviewed  ?COMPREHENSIVE METABOLIC PANEL - Abnormal; Notable for the following components:  ?    Result Value  ? Glucose, Bld 122 (*)   ? ALT 47 (*)   ? All other components within normal limits  ?CBC WITH DIFFERENTIAL/PLATELET - Abnormal; Notable for the following components:  ? Lymphs Abs 4.1 (*)   ? All other components within normal limits  ?TSH  ?BRAIN NATRIURETIC PEPTIDE  ?TROPONIN I (HIGH SENSITIVITY)  ?TROPONIN I (HIGH SENSITIVITY)  ? ? ?EKG ?EKG Interpretation ? ?Date/Time:  Monday May 25 2021 15:16:22 EDT ?Ventricular Rate:  165 ?PR Interval:    ?QRS  Duration: 84 ?QT Interval:  288 ?QTC Calculation: 477 ?R Axis:   49 ?Text Interpretation: Atrial flutter with variable A-V block Nonspecific T wave abnormality Abnormal ECG When compared with ECG of 25-May-2021 14:47, PREVIOUS ECG IS PRESENT Since last tracing of earlier today No significant change was found Confirmed by Daleen Bo [phone]) on 05/25/2021 5:23:22 PM ? ?Radiology ?DG Chest Portable 1 View ? ?Result Date: 05/25/2021 ?CLINICAL DATA:  Chest pain EXAM: PORTABLE CHEST 1 VIEW COMPARISON:  04/08/2017 FINDINGS: Stable cardiomediastinal contours. There is diffusely increased interstitial markings throughout both lungs concerning for pulmonary edema. No airspace consolidation identified. The visualized osseous structures are unremarkable. IMPRESSION: Increased interstitial markings throughout both lungs concerning for pulmonary edema. Electronically Signed   By: Kerby Moors M.D.   On: 05/25/2021 17:19   ? ?Procedures ?Procedures  ? ? ?Medications Ordered in ED ?Medications  ?diltiazem (CARDIZEM) 1 mg/mL load via infusion 20 mg (20 mg Intravenous Bolus from Bag 05/25/21 1608)  ?  And  ?diltiazem (CARDIZEM) 125 mg in dextrose 5% 125 mL (1 mg/mL) infusion (5 mg/hr Intravenous New Bag/Given 05/25/21 1606)  ?sodium chloride 0.9 % bolus 1,000 mL (0 mLs Intravenous Stopped 05/25/21 1713)  ?metoprolol tartrate (LOPRESSOR) injection 5 mg (5 mg Intravenous Given 05/25/21 1537)  ? ? ?ED Course/ Medical Decision Making/ A&P ?  ?                        ?Medical Decision Making ?Patient presenting with palpitations found to be in A-fib with RVR at her PCP office and sent here for evaluation.  No history of same.  No severe symptoms recently at home.  On arrival she is not in respiratory distress or having significant pain. ? ?Problems Addressed: ?Acute congestive heart failure, unspecified heart failure type [facility]): acute illness or injury ?   Details: Associated with rapid A-fib ?Atrial fibrillation with RVR ([place]):  undiagnosed new problem with uncertain prognosis ? ?Amount and/or Complexity of Data Reviewed ?Independent Historian: caregiver ?   Details: Daughter at bedside gives history ?External Data Reviewed: notes. ?   Details: Notes from clinic visit today.  She presented for general checkup and found to be in A-fib with rapid ventricular response.  She had aberrant conduction, intermittently during monitoring.  No evidence for unstable jugular tachyarrhythmia ?Labs: ordered. ?   Details: CBC BC, metabolic panel, troponin, TSH, BMP-normal except glucose high, ALT low ?Radiology: ordered and independent interpretation performed. ?   Details: Chest x-ray, cardiomegaly with acute heart failure/pulmonary edema ?ECG/medicine tests: ordered and independent interpretation performed. ?  Details: Cardiac monitor-A-fib with RVR ? ?Risk ?Prescription drug management. ?Decision regarding hospitalization. ?Risk Details: Patient presenting with palpitations found to be in atrial fibrillation.  Doubt ACS.  Chest x-ray with CHF.  Patient will require hospitalization for diuresis and rate control and cardiac echo.  No significant metabolic abnormality found on testing. ? ?Critical Care ?Total time providing critical care: 44 minutes ? ? ? ? ? ? ? ? ? ?Final Clinical Impression(s) / ED Diagnoses ?Final diagnoses:  ?Atrial fibrillation with RVR ([place])  ?Acute congestive heart failure, unspecified heart failure type ([place])  ? ? ?Rx / DC Orders ?ED Discharge Orders   ? ? None  ? ?  ? ? ?  ?Daleen Bo, MD ?05/25/21 1730 ? ?

## 2021-05-25 NOTE — ED Provider Notes (Signed)
Patient presenting for new onset atrial fibrillation with RVR.  Prior to arrival in the ED, patient presented to her medicine doctor's office.  She endorsed chest tightness and fatigue since yesterday.  She had 5 metoprolol as well as diltiazem bolus and gtt.  She will require admission. ?Physical Exam  ?BP (!) 122/103 (BP Location: Right Arm)   Pulse (!) 130   Temp 99 ?F (37.2 ?C) (Oral)   Resp (!) 30   SpO2 96%  ? ?Physical Exam ?Constitutional:   ?   General: She is not in acute distress. ?   Appearance: Normal appearance. She is not ill-appearing, toxic-appearing or diaphoretic.  ?HENT:  ?   Head: Normocephalic and atraumatic.  ?   Right Ear: External ear normal.  ?   Left Ear: External ear normal.  ?Eyes:  ?   Extraocular Movements: Extraocular movements intact.  ?Cardiovascular:  ?   Rate and Rhythm: Normal rate. Rhythm irregular.  ?Pulmonary:  ?   Effort: Pulmonary effort is normal. No respiratory distress.  ?Abdominal:  ?   General: There is no distension.  ?   Palpations: Abdomen is soft.  ?Musculoskeletal:  ?   Cervical back: Normal range of motion.  ?   Right lower leg: No edema.  ?   Left lower leg: No edema.  ?Skin: ?   General: Skin is warm and dry.  ?   Capillary Refill: Capillary refill takes less than 2 seconds.  ?Neurological:  ?   General: No focal deficit present.  ?   Mental Status: She is alert and oriented to person, place, and time.  ?   Cranial Nerves: No cranial nerve deficit.  ?   Sensory: No sensory deficit.  ?   Motor: No weakness.  ?   Coordination: Coordination normal.  ?Psychiatric:     ?   Mood and Affect: Mood normal.     ?   Behavior: Behavior normal.     ?   Thought Content: Thought content normal.     ?   Judgment: Judgment normal.  ? ? ?Procedures  ?Procedures ? ?ED Course / MDM  ?  ?Medical Decision Making ?Amount and/or Complexity of Data Reviewed ?Labs: ordered. ?ECG/medicine tests: ordered. ? ?Risk ?Prescription drug management. ?Decision regarding  hospitalization. ? ? ?On assessment, patient resting comfortably.  Diltiazem gtt. is infusing.  Heart rate is normalized.  Her rhythm remains irregular.  Her breathing is unlabored.  Language interpretation provided by her daughter, at patient's request.  Patient denies any recent bleeding.  She no longer has menstrual periods.  We will start heparin GTT and admit.  Patient and daughter are agreeable to this plan.  Patient was admitted to medicine for further management. ? ? ? ? ?  ?Godfrey Pick, MD ?05/26/21 0111 ? ?
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
## 2021-05-25 NOTE — ED Notes (Signed)
Pt had non-sustained run of vtach. Pt currently in afib with rate or 105. Pt currently not complaining of any pain. MD paged.  ?

## 2021-05-25 NOTE — Assessment & Plan Note (Addendum)
Acute visit for feeling ill for the past 2 days. Pt's daughter is in room and helped with much of the history. Daughter reports that mother has felt acutely ill since yesterday; she has been experiencing chest tightness, 1 episode of NBNB emesis and fatigue. Today, she complains of chest tightness and palpitations and feeling generally ill. She denies any current chest pain. Vitals with tachycardia 150's. EKG shows A flutter with rates 179. Exam as above. Fortunately 2019 LHC showing normal coronary anatomy with normal left ventricular systolic and diastolic function. Unfortunately, since 2019, she has not followed up much at our clinic or with cardiology, and has not been taking her prescribed ant-hypertensive medication. Overall, given presentation and EKG findings, she will need ED level care for further eval, tx, and management at this time. Pt is transported to the ED and ED charge nurse made aware.   ?
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
## 2021-05-26 ENCOUNTER — Ambulatory Visit (HOSPITAL_COMMUNITY): Payer: Medicaid Other

## 2021-05-26 ENCOUNTER — Other Ambulatory Visit: Payer: Self-pay

## 2021-05-26 ENCOUNTER — Observation Stay (HOSPITAL_COMMUNITY): Payer: Self-pay

## 2021-05-26 ENCOUNTER — Encounter: Payer: Medicaid Other | Admitting: Internal Medicine

## 2021-05-26 DIAGNOSIS — I4891 Unspecified atrial fibrillation: Secondary | ICD-10-CM

## 2021-05-26 DIAGNOSIS — I4892 Unspecified atrial flutter: Secondary | ICD-10-CM

## 2021-05-26 LAB — BASIC METABOLIC PANEL
Anion gap: 9 (ref 5–15)
BUN: 12 mg/dL (ref 6–20)
CO2: 19 mmol/L — ABNORMAL LOW (ref 22–32)
Calcium: 8.4 mg/dL — ABNORMAL LOW (ref 8.9–10.3)
Chloride: 112 mmol/L — ABNORMAL HIGH (ref 98–111)
Creatinine, Ser: 0.89 mg/dL (ref 0.44–1.00)
GFR, Estimated: >60 mL/min (ref >60)
Glucose, Bld: 123 mg/dL — ABNORMAL HIGH (ref 70–99)
Potassium: 3.7 mmol/L (ref 3.5–5.1)
Sodium: 140 mmol/L (ref 135–145)

## 2021-05-26 LAB — ECHOCARDIOGRAM COMPLETE
P 1/2 time: 841 msec
S' Lateral: 3.3 cm

## 2021-05-26 LAB — MAGNESIUM: Magnesium: 1.8 mg/dL (ref 1.7–2.4)

## 2021-05-26 LAB — CBC WITH DIFFERENTIAL/PLATELET
Abs Immature Granulocytes: 0.05 10*3/uL (ref 0.00–0.07)
Basophils Absolute: 0.1 10*3/uL (ref 0.0–0.1)
Basophils Relative: 1 %
Eosinophils Absolute: 0.2 10*3/uL (ref 0.0–0.5)
Eosinophils Relative: 2 %
HCT: 39.8 % (ref 36.0–46.0)
Hemoglobin: 12.9 g/dL (ref 12.0–15.0)
Immature Granulocytes: 0 %
Lymphocytes Relative: 33 %
Lymphs Abs: 3.9 10*3/uL (ref 0.7–4.0)
MCH: 28.7 pg (ref 26.0–34.0)
MCHC: 32.4 g/dL (ref 30.0–36.0)
MCV: 88.4 fL (ref 80.0–100.0)
Monocytes Absolute: 1 10*3/uL (ref 0.1–1.0)
Monocytes Relative: 8 %
Neutro Abs: 6.7 10*3/uL (ref 1.7–7.7)
Neutrophils Relative %: 56 %
Platelets: 233 10*3/uL (ref 150–400)
RBC: 4.5 MIL/uL (ref 3.87–5.11)
RDW: 12.5 % (ref 11.5–15.5)
WBC: 11.9 10*3/uL — ABNORMAL HIGH (ref 4.0–10.5)
nRBC: 0 % (ref 0.0–0.2)

## 2021-05-26 LAB — HEPARIN LEVEL (UNFRACTIONATED)
Heparin Unfractionated: 0.15 IU/mL — ABNORMAL LOW (ref 0.30–0.70)
Heparin Unfractionated: 0.31 IU/mL (ref 0.30–0.70)

## 2021-05-26 LAB — HIV ANTIBODY (ROUTINE TESTING W REFLEX): HIV Screen 4th Generation wRfx: NONREACTIVE

## 2021-05-26 MED ORDER — NITROGLYCERIN 0.4 MG SL SUBL
0.4000 mg | SUBLINGUAL_TABLET | SUBLINGUAL | Status: DC | PRN
  Administered 2021-05-26: 0.4 mg via SUBLINGUAL
  Filled 2021-05-26: qty 1

## 2021-05-26 MED ORDER — METOPROLOL TARTRATE 5 MG/5ML IV SOLN: 5.0000 mg | Freq: Four times a day (QID) | INTRAVENOUS | Status: DC | PRN

## 2021-05-26 MED ORDER — HEPARIN BOLUS VIA INFUSION
2000.0000 [IU] | Freq: Once | INTRAVENOUS | Status: AC
  Administered 2021-05-26: 2000 [IU] via INTRAVENOUS
  Filled 2021-05-26: qty 2000

## 2021-05-26 MED ORDER — SODIUM CHLORIDE 0.9 % IV SOLN: INTRAVENOUS | Status: DC

## 2021-05-26 NOTE — Progress Notes (Signed)
? ?HD#0 ?SUBJECTIVE:  ?Patient Summary: Abigail Butler is a 56 y.o. with a pertinent PMH of hypertension, GERD, previous ischemic CVA, and previous history of latent TB s/p treatment, who presented with chest tightness and admitted for new onset atrial flutter with variable AV block.  ? ?Overnight Events: No acute events overnight ? ?Interim History: This is hospital day 1 for this patient who was seen and evaluated with her daughter at the bedside. Both phone and iPad interpretors unfortunately did not have [place] Arabic interpretors available, although the patient's other daughter phoned-in to the encounter and was able to provide assistance. The patient states that she is not having chest pain but it feels like something is in her chest. Her breathing feels improved. She does have some epigastric pain, but no nausea or vomiting.  ? ?OBJECTIVE:  ?Vital Signs: ?Vitals:  ? 05/26/21 0400 05/26/21 0454 05/26/21 0500 05/26/21 0533  ?BP: 113/69 (!) 98/54 (!) 114/55   ?Pulse:  91    ?Resp: (!) 24 (!) 24 (!) 21 20  ?Temp:    99.1 ?F (37.3 ?C)  ?TempSrc:    Oral  ?SpO2:  92%  92%  ? ?Supplemental O2: Room Air ?SpO2: 92 % ? ?There were no vitals filed for this visit. ? ? ?Intake/Output Summary (Last 24 hours) at 05/26/2021 0656 ?Last data filed at 05/25/2021 1713 ?Gross per 24 hour  ?Intake 999 ml  ?Output --  ?Net 999 ml  ? ?Net IO Since Admission: 999 mL [05/26/21 0656] ? ?Physical Exam: ?General: Pleasant, well-appearing middle aged female laying in bed. No acute distress. ?CV: Tachycardic rate, irregular rhythm. No murmurs, rubs, or gallops. Trace LE edema ?Pulmonary: Lungs CTAB. Normal effort. No wheezing or rales. ?Abdominal: Soft, nontender to palpation, nondistended. Normal bowel sounds. ?Extremities: Palpable radial and DP pulses. Trace lower extremity edema.  ?Skin: Warm and dry.  ?Neuro: A&Ox3. No focal deficit. ?Psych: Normal mood and affect ? ? ? ?ASSESSMENT/PLAN:  ?Assessment: ?Principal Problem: ?  Atrial  flutter ([place]) ? ? ?Plan: ?#New onset atrial flutter ?Patient presented with chest tightness, palpitations, and nausea that had been ongoing for 2 days. She presented to the [facility] with EKG concerning for atrial flutter with HR in the 170s and she was advised to go to the emergency department. EKG In the ER showed similar findings and patient was started on a dilt gtt. CHA2DS2-VASc score 4 and the patient was started on a heparin drip for anticoagulation. This morning, HR in the 90-110s, although rate did get as high as 130s while we were evaluating the patient. Cardiology was consulted to evaluate the patient for possible cardioversion.  ?- Cardiology consulted, appreciate recs ?- Continue cardiac monitoring ?- Echo still pending  ?- Continue dilt drip (current rate is 10) and will wean as able ?- Continue heparin drip  ? ?#Possible pulmonary edema ?BNP elevated to 511 on admission and CXR showed findings concerning for pulmonary edema. The patient has trace lower extremity edema, but otherwise appears euvolemic on exam.  ?- Echo pending ? ?#Prediabetes ?A1c 5.9, not on any medications.  ? ?#Hypertension ?Patient has a history of hypertension and has been prescribed HCTZ 12.5 mg, although she has not taken this medication in years. BP has been low, with SBP in the 80-110s since being started on dilt drip. ?- Hold HCTZ ? ?#History of H. Pylori ?Patient has a history of H. Pylori and failed initial outpatient treatment and was subsequently treated again in 2019, although repeat testing was not performed.  She endorses epigastric pain although she is not very tender on palpation. Would recommend outpatient testing for H. Pylori.  ? ?#Previous CVA ?Previously on lipitor 40 mg and aspirin 81 mg for secondary prevention, although she has been off of these medications for many years. Recommend outpatient lipid panel and follow up.  ? ?Best Practice: ?Diet: Cardiac diet ?IVF: Fluids: none ?VTE: Heparin ?Code: Full ?AB:  none ?Therapy Recs: Pending ?Family Contact: Daughter, at bedside. ?DISPO: Anticipated discharge to Home pending Medical stability. ? ?Signature: ?Derrall Hicks, D.O.  ?Internal Medicine Resident, PGY-1 ?[facility]  ?Pager: [phone] ?6:56 AM, 05/26/2021  ? ?Please contact the on call pager after 5 pm and on weekends at [phone]. ? ?
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
## 2021-05-26 NOTE — Progress Notes (Signed)
Patient c/o mid upper chest pressure that occurs with exertion. She endorses this pain has been ongoing for the last five days. Chest Pain protocol has been implemented. Patient received one Nitroglycerin tab and the pain resolved after 10 mins. Patient is 98% on 4L [place]. Patient is resting with her eyes closed. Family is at bedside. Rounding MD- Humphrey Rolls MD with internal medicine notified. ? ?

## 2021-05-26 NOTE — Progress Notes (Signed)
?  Echocardiogram ?2D Echocardiogram has been performed. ? ?Abigail Butler ?05/26/2021, 1:48 PM ?

## 2021-05-26 NOTE — Hospital Course (Addendum)
4/4 ?Feels fine this AM. Asking when she can go home.  ? ?________________________________ ? ?New onset atrial flutter w/ variable AV block ?Patient presented with progressive chest tightness, nausea, vomiting and palpitations for the past two days. She presented to the [facility] center on 3/27 with initial EKG concerning for atrial flutter with heart rates in the 170s.  Repeat EKG in the ER showed atrial flutter with HR at 165, started on diltiazem gtt. Rates improved to low 100s. No recent infection, illness or surgery. Troponins flat. TSH within normal limits.  BNP 511.  Chest radiograph with signs of pulmonary edema.  No echocardiogram on file.  Left heart cath and coronary angiogram in 2019 completed for chest pain showed normal coronary anatomy and normal LV function, EF 55 to 65%. No history of congestive heart failure or overt signs or symptoms of heart failure on exam; no JVD, orthopnea, lower extremity edema, abdominal distention or shortness of breath.  Patient does have cool lower extremities.  She also has a history of hypertension and has been off of antihypertensive medications for several years.  Of note she has been normotensive since admission but may be in the setting of atrial flutter. Per up-to-date, although the risk of systemic embolism is probably a little lower compared with atrial fibrillation, it is appropriate to use anticoagulation in atrial flutter as similar to atrial fibrillation. Risk stratification using the CHA2DS2-VASc scoring system is recommended; patient's score is 4 (sex, hx of  stroke and HTN).  Started on heparin in the ED. 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 Cardiology was consulted and performed a TEE, which confirmed severe rheumatic mitral stenosis and also showed a large LAA thrombus, thus the patient was not able to be cardioverted at that time. She is being bridged to coumadin *** Additionally,  dilt drip was weaned off *** and she was started on oral diltiazem 60 mg q6h, metoprolol 25 mg bid, and digoxin 0.25 mg daily.  ? ?Possible pulmonary edema ?CHF ruled out ?BNP elevated to 511 on admission and CXR showed findings concerning for pulmonary edema. The patient has trace lower extremity edema, but otherwise appears euvolemic on exam. Echo revealed normal EF and clinically, the patient remained euvolemic/does not have CHF. She did not require diuresis.   ?  ?Hypertension ?Normotensive on admission.  History of hypertension previously on hydrochlorothiazide 12.5 mg daily. Patient has not taken this medication for several years. BP did become lower throughout hospitalization, as she was started on diltiazem and metoprolol.  ? ?History of CVA ?Previously on atorvastatin 40 mg and aspirin 81 mg for secondary prevention.  Patient has been off of these medications for several years. Last lipid profile was in 2013 showed a total cholesterol of 147 and LDL of 95. Recommend repeat lipid profile outpatient ? ?History of H. Pylori ?Epigastric tenderness ?Patient has a history of H. pylori, previously failed outpatient treatment and was treated again in 2019.  Repeat testing was not completed after second treatment.  She endorsed epigastric pain on admission, which she attributes to needing to have a bowel movement.  We will continue to monitor and recommend outpatient testing for H. pylori if epigastric tenderness persists. Her pain improved throughout hospitalization.  ?  ?Prediabetes ?A1c 5.9, not on any medications.  ?
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
## 2021-05-27 ENCOUNTER — Inpatient Hospital Stay (HOSPITAL_COMMUNITY): Payer: Self-pay | Admitting: Certified Registered"

## 2021-05-27 ENCOUNTER — Inpatient Hospital Stay (HOSPITAL_COMMUNITY): Payer: Self-pay

## 2021-05-27 ENCOUNTER — Other Ambulatory Visit (HOSPITAL_COMMUNITY): Payer: Self-pay

## 2021-05-27 ENCOUNTER — Encounter (HOSPITAL_COMMUNITY): Payer: Self-pay | Admitting: Student

## 2021-05-27 ENCOUNTER — Encounter (HOSPITAL_COMMUNITY)
Admission: EM | Disposition: A | Discharge status: Home / Self Care | Payer: Self-pay | Source: Home / Self Care | Attending: Internal Medicine

## 2021-05-27 DIAGNOSIS — I340 Nonrheumatic mitral (valve) insufficiency: Secondary | ICD-10-CM

## 2021-05-27 DIAGNOSIS — I513 Intracardiac thrombosis, not elsewhere classified: Secondary | ICD-10-CM

## 2021-05-27 DIAGNOSIS — I052 Rheumatic mitral stenosis with insufficiency: Secondary | ICD-10-CM

## 2021-05-27 DIAGNOSIS — E669 Obesity, unspecified: Secondary | ICD-10-CM

## 2021-05-27 DIAGNOSIS — I080 Rheumatic disorders of both mitral and aortic valves: Secondary | ICD-10-CM

## 2021-05-27 DIAGNOSIS — I361 Nonrheumatic tricuspid (valve) insufficiency: Secondary | ICD-10-CM

## 2021-05-27 DIAGNOSIS — I10 Essential (primary) hypertension: Secondary | ICD-10-CM

## 2021-05-27 DIAGNOSIS — I4891 Unspecified atrial fibrillation: Secondary | ICD-10-CM

## 2021-05-27 DIAGNOSIS — I351 Nonrheumatic aortic (valve) insufficiency: Secondary | ICD-10-CM

## 2021-05-27 DIAGNOSIS — I342 Nonrheumatic mitral (valve) stenosis: Secondary | ICD-10-CM

## 2021-05-27 HISTORY — PX: TEE WITHOUT CARDIOVERSION: SHX5443

## 2021-05-27 LAB — BASIC METABOLIC PANEL
Anion gap: 10 (ref 5–15)
BUN: 8 mg/dL (ref 6–20)
CO2: 18 mmol/L — ABNORMAL LOW (ref 22–32)
Calcium: 8.6 mg/dL — ABNORMAL LOW (ref 8.9–10.3)
Chloride: 112 mmol/L — ABNORMAL HIGH (ref 98–111)
Creatinine, Ser: 0.61 mg/dL (ref 0.44–1.00)
GFR, Estimated: >60 mL/min (ref >60)
Glucose, Bld: 124 mg/dL — ABNORMAL HIGH (ref 70–99)
Potassium: 3.6 mmol/L (ref 3.5–5.1)
Sodium: 140 mmol/L (ref 135–145)

## 2021-05-27 LAB — HEPARIN LEVEL (UNFRACTIONATED): Heparin Unfractionated: 0.4 IU/mL (ref 0.30–0.70)

## 2021-05-27 LAB — CBC
HCT: 38.1 % (ref 36.0–46.0)
Hemoglobin: 12.2 g/dL (ref 12.0–15.0)
MCH: 28.4 pg (ref 26.0–34.0)
MCHC: 32 g/dL (ref 30.0–36.0)
MCV: 88.6 fL (ref 80.0–100.0)
Platelets: 206 10*3/uL (ref 150–400)
RBC: 4.3 MIL/uL (ref 3.87–5.11)
RDW: 12.9 % (ref 11.5–15.5)
WBC: 10.2 10*3/uL (ref 4.0–10.5)
nRBC: 0 % (ref 0.0–0.2)

## 2021-05-27 LAB — PROTIME-INR
INR: 1.2 (ref 0.8–1.2)
Prothrombin Time: 14.8 seconds (ref 11.4–15.2)

## 2021-05-27 LAB — MAGNESIUM: Magnesium: 1.8 mg/dL (ref 1.7–2.4)

## 2021-05-27 SURGERY — ECHOCARDIOGRAM, TRANSESOPHAGEAL
Anesthesia: General

## 2021-05-27 MED ORDER — PHENYLEPHRINE HCL-NACL 20-0.9 MG/250ML-% IV SOLN
INTRAVENOUS | Status: DC | PRN
  Administered 2021-05-27: 25 ug/min via INTRAVENOUS

## 2021-05-27 MED ORDER — PROPOFOL 500 MG/50ML IV EMUL
INTRAVENOUS | Status: DC | PRN
  Administered 2021-05-27: 150 ug/kg/min via INTRAVENOUS

## 2021-05-27 MED ORDER — SODIUM CHLORIDE 0.9 % IV SOLN: INTRAVENOUS | Status: DC

## 2021-05-27 MED ORDER — PROPOFOL 10 MG/ML IV BOLUS
INTRAVENOUS | Status: DC | PRN
  Administered 2021-05-27: 30 mg via INTRAVENOUS

## 2021-05-27 MED ORDER — LIDOCAINE 2% (20 MG/ML) 5 ML SYRINGE
INTRAMUSCULAR | Status: DC | PRN
  Administered 2021-05-27: 100 mg via INTRAVENOUS

## 2021-05-27 MED ORDER — LACTATED RINGERS IV SOLN: INTRAVENOUS | Status: DC | PRN

## 2021-05-27 NOTE — Progress Notes (Signed)
?   05/27/21 2115  ?Assess: MEWS Score  ?BP 117/73  ?Pulse Rate 93  ?ECG Heart Rate (!) 106  ?Resp (!) 26  ?SpO2 95 %  ?Assess: MEWS Score  ?MEWS Temp 0  ?MEWS Systolic 0  ?MEWS Pulse 1  ?MEWS RR 2  ?MEWS LOC 0  ?MEWS Score 3  ?MEWS Score Color Yellow  ?Assess: if the MEWS score is Yellow or Red  ?Were vital signs taken at a resting state? Yes  ?Focused Assessment Change from prior assessment (see assessment flowsheet)  ?Early Detection of Sepsis Score *See Row Information* Low  ?MEWS guidelines implemented *See Row Information* Yes  ?Treat  ?MEWS Interventions Administered scheduled meds/treatments ?(see MAR)  ?Take Vital Signs  ?Increase Vital Sign Frequency  Yellow: Q 2hr X 2 then Q 4hr X 2, if remains yellow, continue Q 4hrs  ?Escalate  ?MEWS: Escalate Yellow: discuss with charge nurse/RN and consider discussing with provider and RRT  ?Notify: Charge Nurse/RN  ?Name of Charge Nurse/RN Notified [place], RN  ?Date Charge Nurse/RN Notified 05/27/21  ?Time Charge Nurse/RN Notified 2120  ?Document  ?Patient Outcome Stabilized after interventions  ?Progress note created (see row info) Yes  ? ? ?

## 2021-05-27 NOTE — Progress Notes (Signed)
? ?Progress Note ? ?Patient Name: Abigail Butler ?Date of Encounter: 05/27/2021 ? ?[facility] Cardiologist:   Revankar ? ?Subjective  ? ?Complains of nausea  Still having headaches  ? ?Inpatient Medications  ?  ?Scheduled Meds: ? ?Continuous Infusions: ? sodium chloride 10 mL/hr at 05/27/21 0644  ? sodium chloride    ? diltiazem (CARDIZEM) infusion 10 mg/hr (05/27/21 0644)  ? heparin 1,400 Units/hr (05/27/21 [id number])  ? ?PRN Meds: ?acetaminophen, metoprolol tartrate, ondansetron (ZOFRAN) IV  ? ?Vital Signs  ?  ?Vitals:  ? 05/26/21 2043 05/27/21 0007 05/27/21 [id number] 05/27/21 0445  ?BP:  (!) 98/52 121/62   ?Pulse:  94 92 (!) 103  ?Resp: 18 20 20 18   ?Temp:  98.5 ?F (36.9 ?C) 98.6 ?F (37 ?C)   ?TempSrc:  Oral Oral   ?SpO2:  91% 95% 94%  ? ? ?Intake/Output Summary (Last 24 hours) at 05/27/2021 0655 ?Last data filed at 05/27/2021 [phone] ?Gross per 24 hour  ?Intake 1246.74 ml  ?Output --  ?Net 1246.74 ml  ? ? ?  12/08/2017  ?  2:08 PM 05/18/2017  ? 10:07 AM 05/11/2017  ?  3:30 PM  ?Last 3 Weights  ?Weight (lbs) 183 lb 9.6 oz 191 lb 192 lb  ?Weight (kg) 83.28 kg 86.637 kg 87.091 kg  ?   ? ?Telemetry  ?  ?Afib   RVR at times  - Personally Reviewed ? ?ECG  ?  ? - Personally Reviewed ? ?Physical Exam  ? ?GEN: No acute distress.   ?Neck: No JVD ?Cardiac: Irreg irreg  I/vI diastolic at apex  ?Respiratory: Clear to auscultation bilaterally. ?GI: Soft, nontender, non-distended  ?MS: No edema; No deformity. ?Neuro:  Nonfocal  ?Psych: Normal affect  ? ?Labs  ?  ?High Sensitivity Troponin:   ?Recent Labs  ?Lab 05/25/21 ?1532 05/25/21 ?1714  ?TROPONINIHS 12 13  ?   ?Chemistry ?Recent Labs  ?Lab 05/25/21 ?1532 05/25/21 ?2026 05/26/21 ?0352 05/27/21 ?0330  ?NA 139  --  140 140  ?K 3.7  --  3.7 3.6  ?CL 110  --  112* 112*  ?CO2 23  --  19* 18*  ?GLUCOSE 122*  --  123* 124*  ?BUN 9  --  12 8  ?CREATININE 0.75  --  0.89 0.61  ?CALCIUM 9.1  --  8.4* 8.6*  ?MG  --  1.8  --  1.8  ?PROT 7.5  --   --   --   ?ALBUMIN 3.7  --   --   --   ?AST 36  --   --    --   ?ALT 47*  --   --   --   ?ALKPHOS 74  --   --   --   ?BILITOT 0.4  --   --   --   ?GFRNONAA >60  --  >60 >60  ?ANIONGAP 6  --  9 10  ?  ?Lipids No results for input(s): CHOL, TRIG, HDL, LABVLDL, LDLCALC, CHOLHDL in the last 168 hours.  ?Hematology ?Recent Labs  ?Lab 05/25/21 ?1532 05/26/21 ?0352 05/27/21 ?0330  ?WBC 9.4 11.9* 10.2  ?RBC 4.61 4.50 4.30  ?HGB 12.8 12.9 12.2  ?HCT 40.7 39.8 38.1  ?MCV 88.3 88.4 88.6  ?MCH 27.8 28.7 28.4  ?MCHC 31.4 32.4 32.0  ?RDW 12.6 12.5 12.9  ?PLT 278 233 206  ? ?Thyroid  ?Recent Labs  ?Lab 05/25/21 ?1532  ?TSH 0.862  ?  ?BNP ?Recent Labs  ?Lab 05/25/21 ?1720  ?BNP 511.1*  ?  ?  DDimer No results for input(s): DDIMER in the last 168 hours.  ? ?Radiology  ?  ?DG Chest Portable 1 View ? ?Result Date: 05/25/2021 ?CLINICAL DATA:  Chest pain EXAM: PORTABLE CHEST 1 VIEW COMPARISON:  04/08/2017 FINDINGS: Stable cardiomediastinal contours. There is diffusely increased interstitial markings throughout both lungs concerning for pulmonary edema. No airspace consolidation identified. The visualized osseous structures are unremarkable. IMPRESSION: Increased interstitial markings throughout both lungs concerning for pulmonary edema. Electronically Signed   By: Kerby Moors M.D.   On: 05/25/2021 17:19  ? ?ECHOCARDIOGRAM COMPLETE ? ?Result Date: 05/26/2021 ?   ECHOCARDIOGRAM REPORT   Patient Name:   Abigail Butler Date of Exam: 05/26/2021 Medical Rec #:  [id number]    Height:       65.0 in Accession #:    [id number]   Weight:       183.6 lb Date of Birth:  [birth year]     BSA:          1.907 m? Patient Age:    56 years     BP:           104/50 mmHg Patient Gender: F            HR:           88 bpm. Exam Location:  Inpatient Procedure: 2D Echo, Cardiac Doppler and Color Doppler Indications:    Atrial Flutter I48.92  History:        Patient has no prior history of Echocardiogram examinations.  Sonographer:    Merrie Roof RDCS Referring Phys: [id number] [place]  1. Severe rheumatic mitral  stenosis is present. MG 10.1 mmHG @ 95 bpm. PHT difficult due to Afib, but PHT ~140 msec which equates to MVA of 1.56 cm2. Leaflets are mildly thickened with calcification of the PMVL tip. Anterior leaflet is mobile but the PMVL is restricted. There is mild MR. Would recommend TEE for better characterization including MVA by direct 3D assessment. The mitral valve is rheumatic. Mild mitral valve regurgitation. Severe mitral stenosis. The mean mitral valve gradient is 10.1 mmHg with average heart rate of 95 bpm.  2. Left ventricular ejection fraction, by estimation, is 55 to 60%. The left ventricle has normal function. The left ventricle has no regional wall motion abnormalities. Left ventricular diastolic function could not be evaluated.  3. Right ventricular systolic function is normal. The right ventricular size is normal. There is mildly elevated pulmonary artery systolic pressure. The estimated right ventricular systolic pressure is 99991111 mmHg.  4. Left atrial size was severely dilated.  5. Right atrial size was mildly dilated.  6. The aortic valve is tricuspid. Aortic valve regurgitation is mild. No aortic stenosis is present.  7. The inferior vena cava is normal in size with <50% respiratory variability, suggesting right atrial pressure of 8 mmHg. FINDINGS  Left Ventricle: Left ventricular ejection fraction, by estimation, is 55 to 60%. The left ventricle has normal function. The left ventricle has no regional wall motion abnormalities. The left ventricular internal cavity size was normal in size. There is  no left ventricular hypertrophy. Left ventricular diastolic function could not be evaluated due to atrial fibrillation. Left ventricular diastolic function could not be evaluated. Right Ventricle: The right ventricular size is normal. No increase in right ventricular wall thickness. Right ventricular systolic function is normal. There is mildly elevated pulmonary artery systolic pressure. The tricuspid  regurgitant velocity is 2.65  m/s, and with an assumed right atrial pressure of 8 mmHg, the  estimated right ventricular systolic pressure is 99991111 mmHg. Left Atrium: Left atrial size was severely dilated. Right Atrium: Right atrial size was mildly dilated. Pericardium: There is no evidence of pericardial effusion. Mitral Valve: Severe rheumatic mitral stenosis is present. MG 10.1 mmHG @ 95 bpm. PHT difficult due to Afib, but PHT ~140 msec which equates to MVA of 1.56 cm2. Leaflets are mildly thickened with calcification of the PMVL tip. Anterior leaflet is mobile but the PMVL is restricted. There is mild MR. Would recommend TEE for better characterization including MVA by direct 3D assessment. The mitral valve is rheumatic. Mild mitral valve regurgitation. Severe mitral valve stenosis. MV peak gradient, 21.2 mmHg. The mean mitral valve gradient is 10.1 mmHg with average heart rate of 95 bpm. Tricuspid Valve: The tricuspid valve is grossly normal. Tricuspid valve regurgitation is mild . No evidence of tricuspid stenosis. Aortic Valve: The aortic valve is tricuspid. Aortic valve regurgitation is mild. Aortic regurgitation PHT measures 841 msec. No aortic stenosis is present. Pulmonic Valve: The pulmonic valve was grossly normal. Pulmonic valve regurgitation is not visualized. No evidence of pulmonic stenosis. Aorta: The aortic root and ascending aorta are structurally normal, with no evidence of dilitation. Venous: The inferior vena cava is normal in size with less than 50% respiratory variability, suggesting right atrial pressure of 8 mmHg. IAS/Shunts: The atrial septum is grossly normal.  LEFT VENTRICLE PLAX 2D LVIDd:         4.50 cm LVIDs:         3.30 cm LV PW:         1.00 cm LV IVS:        1.00 cm LVOT diam:     2.20 cm LV SV:         62 LV SV Index:   33 LVOT Area:     3.80 cm?  LV Volumes (MOD) LV vol d, MOD A4C: 47.6 ml LV SV MOD A4C:     47.6 ml RIGHT VENTRICLE RV Basal diam:  3.50 cm RV S prime:     11.70  cm/s TAPSE (M-mode): 1.8 cm LEFT ATRIUM              Index        RIGHT ATRIUM           Index LA diam:        4.30 cm  2.25 cm/m?   RA Area:     14.90 cm? LA Vol (A2C):   117.0 ml 61.34 ml/m?  RA Volume:   3

## 2021-05-27 NOTE — CV Procedure (Signed)
? ? ? ?  TRANSESOPHAGEAL ECHOCARDIOGRAM  ? ?NAME:  Abigail Butler   MRN: [id number] ?DOB:  May 17, 1965   ADMIT DATE: 05/25/2021 ? ?INDICATIONS: ?Mitral stenosis ? ?PROCEDURE:  ? ?Informed consent was obtained prior to the procedure. The risks, benefits and alternatives for the procedure were discussed and the patient comprehended these risks.  Risks include, but are not limited to, cough, sore throat, vomiting, nausea, somnolence, esophageal and stomach trauma or perforation, bleeding, low blood pressure, aspiration, pneumonia, infection, trauma to the teeth and death.   ? ?After a procedural time-out, the oropharynx was anesthetized and the patient was sedated by the anesthesia service. The transesophageal probe was inserted in the esophagus and stomach without difficulty and multiple views were obtained.  ? ? ?COMPLICATIONS:   ? ?There were no immediate complications. ? ?FINDINGS: ? ?Large LAA thrombus.  Severe MS ? ? ?Oswaldo Milian MD ?[facility]  ?[address] ?[place], [place] [address] ?[phone]  ? ?2:54 PM ? ? ?

## 2021-05-27 NOTE — Anesthesia Postprocedure Evaluation (Signed)
Anesthesia Post Note ? ?Patient: Starasia Freet ? ?Procedure(s) Performed: TRANSESOPHAGEAL ECHOCARDIOGRAM (TEE) ? ?  ? ?Patient location during evaluation: PACU ?Anesthesia Type: MAC ?Level of consciousness: awake and alert ?Pain management: pain level controlled ?Vital Signs Assessment: post-procedure vital signs reviewed and stable ?Respiratory status: spontaneous breathing ?Cardiovascular status: stable ?Anesthetic complications: no ? ? ?No notable events documented. ? ?Last Vitals:  ?Vitals:  ? 05/27/21 1440 05/27/21 1450  ?BP: (!) 127/58 (!) 127/58  ?Pulse: (!) 106 (!) 102  ?Resp: 17 (!) 22  ?Temp: 36.8 ?C   ?SpO2: 90% 91%  ?  ?Last Pain:  ?Vitals:  ? 05/27/21 1450  ?TempSrc:   ?PainSc: 0-No pain  ? ? ?  ?  ?  ?  ?  ?  ? ?Nolon Nations ? ? ? ? ?

## 2021-05-27 NOTE — Interval H&P Note (Signed)
History and Physical Interval Note: ? ?05/27/2021 ?1:43 PM ? ?Abigail Butler  has presented today for surgery, with the diagnosis of AFIB.  The various methods of treatment have been discussed with the patient and family. After consideration of risks, benefits and other options for treatment, the patient has consented to  Procedure(s): ?TRANSESOPHAGEAL ECHOCARDIOGRAM (TEE) (N/A) ?CARDIOVERSION (N/A) as a surgical intervention.  The patient's history has been reviewed, patient examined, no change in status, stable for surgery.  I have reviewed the patient's chart and labs.  Questions were answered to the patient's satisfaction.   ? ? ?[name] ? ? ?
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
## 2021-05-27 NOTE — Transfer of Care (Signed)
Immediate Anesthesia Transfer of Care Note ? ?Patient: Abigail Butler ? ?Procedure(s) Performed: TRANSESOPHAGEAL ECHOCARDIOGRAM (TEE) ? ?Patient Location: PACU ? ?Anesthesia Type:MAC ? ?Level of Consciousness: drowsy ? ?Airway & Oxygen Therapy: Patient Spontanous Breathing and Patient connected to nasal cannula oxygen ? ?Post-op Assessment: Report given to RN and Post -op Vital signs reviewed and stable ? ?Post vital signs: Reviewed and stable ? ?Last Vitals:  ?Vitals Value Taken Time  ?BP    ?Temp    ?Pulse    ?Resp    ?SpO2    ? ? ?Last Pain:  ?Vitals:  ? 05/27/21 1200  ?TempSrc: Temporal  ?PainSc:   ?   ? ?Patients Stated Pain Goal: 0 (05/27/21 0800) ? ?Complications: No notable events documented. ?

## 2021-05-27 NOTE — H&P (View-Only) (Signed)
? ?Progress Note ? ?Patient Name: Abigail Butler ?Date of Encounter: 05/27/2021 ? ?[facility] Cardiologist:   Revankar ? ?Subjective  ? ?Complains of nausea  Still having headaches  ? ?Inpatient Medications  ?  ?Scheduled Meds: ? ?Continuous Infusions: ? sodium chloride 10 mL/hr at 05/27/21 0644  ? sodium chloride    ? diltiazem (CARDIZEM) infusion 10 mg/hr (05/27/21 0644)  ? heparin 1,400 Units/hr (05/27/21 [id number])  ? ?PRN Meds: ?acetaminophen, metoprolol tartrate, ondansetron (ZOFRAN) IV  ? ?Vital Signs  ?  ?Vitals:  ? 05/26/21 2043 05/27/21 0007 05/27/21 [id number] 05/27/21 0445  ?BP:  (!) 98/52 121/62   ?Pulse:  94 92 (!) 103  ?Resp: 18 20 20 18   ?Temp:  98.5 ?F (36.9 ?C) 98.6 ?F (37 ?C)   ?TempSrc:  Oral Oral   ?SpO2:  91% 95% 94%  ? ? ?Intake/Output Summary (Last 24 hours) at 05/27/2021 0655 ?Last data filed at 05/27/2021 [phone] ?Gross per 24 hour  ?Intake 1246.74 ml  ?Output --  ?Net 1246.74 ml  ? ? ?  12/08/2017  ?  2:08 PM 05/18/2017  ? 10:07 AM 05/11/2017  ?  3:30 PM  ?Last 3 Weights  ?Weight (lbs) 183 lb 9.6 oz 191 lb 192 lb  ?Weight (kg) 83.28 kg 86.637 kg 87.091 kg  ?   ? ?Telemetry  ?  ?Afib   RVR at times  - Personally Reviewed ? ?ECG  ?  ? - Personally Reviewed ? ?Physical Exam  ? ?GEN: No acute distress.   ?Neck: No JVD ?Cardiac: Irreg irreg  I/vI diastolic at apex  ?Respiratory: Clear to auscultation bilaterally. ?GI: Soft, nontender, non-distended  ?MS: No edema; No deformity. ?Neuro:  Nonfocal  ?Psych: Normal affect  ? ?Labs  ?  ?High Sensitivity Troponin:   ?Recent Labs  ?Lab 05/25/21 ?1532 05/25/21 ?1714  ?TROPONINIHS 12 13  ?   ?Chemistry ?Recent Labs  ?Lab 05/25/21 ?1532 05/25/21 ?2026 05/26/21 ?0352 05/27/21 ?0330  ?NA 139  --  140 140  ?K 3.7  --  3.7 3.6  ?CL 110  --  112* 112*  ?CO2 23  --  19* 18*  ?GLUCOSE 122*  --  123* 124*  ?BUN 9  --  12 8  ?CREATININE 0.75  --  0.89 0.61  ?CALCIUM 9.1  --  8.4* 8.6*  ?MG  --  1.8  --  1.8  ?PROT 7.5  --   --   --   ?ALBUMIN 3.7  --   --   --   ?AST 36  --   --    --   ?ALT 47*  --   --   --   ?ALKPHOS 74  --   --   --   ?BILITOT 0.4  --   --   --   ?GFRNONAA >60  --  >60 >60  ?ANIONGAP 6  --  9 10  ?  ?Lipids No results for input(s): CHOL, TRIG, HDL, LABVLDL, LDLCALC, CHOLHDL in the last 168 hours.  ?Hematology ?Recent Labs  ?Lab 05/25/21 ?1532 05/26/21 ?0352 05/27/21 ?0330  ?WBC 9.4 11.9* 10.2  ?RBC 4.61 4.50 4.30  ?HGB 12.8 12.9 12.2  ?HCT 40.7 39.8 38.1  ?MCV 88.3 88.4 88.6  ?MCH 27.8 28.7 28.4  ?MCHC 31.4 32.4 32.0  ?RDW 12.6 12.5 12.9  ?PLT 278 233 206  ? ?Thyroid  ?Recent Labs  ?Lab 05/25/21 ?1532  ?TSH 0.862  ?  ?BNP ?Recent Labs  ?Lab 05/25/21 ?1720  ?BNP 511.1*  ?  ?  DDimer No results for input(s): DDIMER in the last 168 hours.  ? ?Radiology  ?  ?DG Chest Portable 1 View ? ?Result Date: 05/25/2021 ?CLINICAL DATA:  Chest pain EXAM: PORTABLE CHEST 1 VIEW COMPARISON:  04/08/2017 FINDINGS: Stable cardiomediastinal contours. There is diffusely increased interstitial markings throughout both lungs concerning for pulmonary edema. No airspace consolidation identified. The visualized osseous structures are unremarkable. IMPRESSION: Increased interstitial markings throughout both lungs concerning for pulmonary edema. Electronically Signed   By: Kerby Moors M.D.   On: 05/25/2021 17:19  ? ?ECHOCARDIOGRAM COMPLETE ? ?Result Date: 05/26/2021 ?   ECHOCARDIOGRAM REPORT   Patient Name:   Abigail Butler Date of Exam: 05/26/2021 Medical Rec #:  [id number]    Height:       65.0 in Accession #:    ID:[id number]   Weight:       183.6 lb Date of Birth:  March 08, 1965     BSA:          1.907 m? Patient Age:    56 years     BP:           104/50 mmHg Patient Gender: F            HR:           88 bpm. Exam Location:  Inpatient Procedure: 2D Echo, Cardiac Doppler and Color Doppler Indications:    Atrial Flutter I48.92  History:        Patient has no prior history of Echocardiogram examinations.  Sonographer:    Merrie Roof RDCS Referring Phys: [id number] [place]  1. Severe rheumatic mitral  stenosis is present. MG 10.1 mmHG @ 95 bpm. PHT difficult due to Afib, but PHT ~140 msec which equates to MVA of 1.56 cm2. Leaflets are mildly thickened with calcification of the PMVL tip. Anterior leaflet is mobile but the PMVL is restricted. There is mild MR. Would recommend TEE for better characterization including MVA by direct 3D assessment. The mitral valve is rheumatic. Mild mitral valve regurgitation. Severe mitral stenosis. The mean mitral valve gradient is 10.1 mmHg with average heart rate of 95 bpm.  2. Left ventricular ejection fraction, by estimation, is 55 to 60%. The left ventricle has normal function. The left ventricle has no regional wall motion abnormalities. Left ventricular diastolic function could not be evaluated.  3. Right ventricular systolic function is normal. The right ventricular size is normal. There is mildly elevated pulmonary artery systolic pressure. The estimated right ventricular systolic pressure is 99991111 mmHg.  4. Left atrial size was severely dilated.  5. Right atrial size was mildly dilated.  6. The aortic valve is tricuspid. Aortic valve regurgitation is mild. No aortic stenosis is present.  7. The inferior vena cava is normal in size with <50% respiratory variability, suggesting right atrial pressure of 8 mmHg. FINDINGS  Left Ventricle: Left ventricular ejection fraction, by estimation, is 55 to 60%. The left ventricle has normal function. The left ventricle has no regional wall motion abnormalities. The left ventricular internal cavity size was normal in size. There is  no left ventricular hypertrophy. Left ventricular diastolic function could not be evaluated due to atrial fibrillation. Left ventricular diastolic function could not be evaluated. Right Ventricle: The right ventricular size is normal. No increase in right ventricular wall thickness. Right ventricular systolic function is normal. There is mildly elevated pulmonary artery systolic pressure. The tricuspid  regurgitant velocity is 2.65  m/s, and with an assumed right atrial pressure of 8 mmHg, the  estimated right ventricular systolic pressure is 99991111 mmHg. Left Atrium: Left atrial size was severely dilated. Right Atrium: Right atrial size was mildly dilated. Pericardium: There is no evidence of pericardial effusion. Mitral Valve: Severe rheumatic mitral stenosis is present. MG 10.1 mmHG @ 95 bpm. PHT difficult due to Afib, but PHT ~140 msec which equates to MVA of 1.56 cm2. Leaflets are mildly thickened with calcification of the PMVL tip. Anterior leaflet is mobile but the PMVL is restricted. There is mild MR. Would recommend TEE for better characterization including MVA by direct 3D assessment. The mitral valve is rheumatic. Mild mitral valve regurgitation. Severe mitral valve stenosis. MV peak gradient, 21.2 mmHg. The mean mitral valve gradient is 10.1 mmHg with average heart rate of 95 bpm. Tricuspid Valve: The tricuspid valve is grossly normal. Tricuspid valve regurgitation is mild . No evidence of tricuspid stenosis. Aortic Valve: The aortic valve is tricuspid. Aortic valve regurgitation is mild. Aortic regurgitation PHT measures 841 msec. No aortic stenosis is present. Pulmonic Valve: The pulmonic valve was grossly normal. Pulmonic valve regurgitation is not visualized. No evidence of pulmonic stenosis. Aorta: The aortic root and ascending aorta are structurally normal, with no evidence of dilitation. Venous: The inferior vena cava is normal in size with less than 50% respiratory variability, suggesting right atrial pressure of 8 mmHg. IAS/Shunts: The atrial septum is grossly normal.  LEFT VENTRICLE PLAX 2D LVIDd:         4.50 cm LVIDs:         3.30 cm LV PW:         1.00 cm LV IVS:        1.00 cm LVOT diam:     2.20 cm LV SV:         62 LV SV Index:   33 LVOT Area:     3.80 cm?  LV Volumes (MOD) LV vol d, MOD A4C: 47.6 ml LV SV MOD A4C:     47.6 ml RIGHT VENTRICLE RV Basal diam:  3.50 cm RV S prime:     11.70  cm/s TAPSE (M-mode): 1.8 cm LEFT ATRIUM              Index        RIGHT ATRIUM           Index LA diam:        4.30 cm  2.25 cm/m?   RA Area:     14.90 cm? LA Vol (A2C):   117.0 ml 61.34 ml/m?  RA Volume:   3

## 2021-05-27 NOTE — Progress Notes (Signed)
? ?  HD#2 ?SUBJECTIVE:  ?Patient Summary: Abigail Butler is a 56 y.o. with a pertinent PMH of hypertension, GERD, previous ischemic CVA, and previous history of latent TB s/p treatment, who presented with chest tightness and admitted for new onset atrial flutter with variable AV block.  ? ?Overnight Events: No acute events overnight.  ? ?Interim History: This is hospital day 2 for this patient who was seen and evaluated at the bedside this morning. She is having some nausea this morning and a headache. Denies any chest pain.  ? ?OBJECTIVE:  ?Vital Signs: ?Vitals:  ? 05/26/21 2043 05/27/21 0007 05/27/21 0428 05/27/21 0445  ?BP:  (!) 98/52 121/62   ?Pulse:  94 92 (!) 103  ?Resp: 18 20 (!) 24 18  ?Temp:  98.5 ?F (36.9 ?C) 98.6 ?F (37 ?C)   ?TempSrc:  Oral Oral   ?SpO2:  91% 95% 94%  ? ?Supplemental O2: Room Air ?SpO2: 94 % ? ?There were no vitals filed for this visit. ? ?No intake or output data in the 24 hours ending 05/27/21 0636 ?Net IO Since Admission: 999 mL [05/27/21 0636] ? ?Physical Exam: ?General: Pleasant, well-appearing middle aged laying in bed. No acute distress. ?CV: Tachycardic rate, irregular rhythm. No murmur. Appreciated. Trace LE edema  ?Pulmonary: Lungs CTAB. Normal effort. No wheezing or rales. ?Abdominal: Soft, nontender, nondistended. Normal bowel sounds. ?Extremities: Palpable radial and DP pulses. Trace bilateral LE edema ?Skin: Warm and dry.  ?Neuro: A&Ox3. No focal deficit. ?Psych: Normal mood and affect ? ? ? ?ASSESSMENT/PLAN:  ?Assessment: ?Principal Problem: ?  Atrial flutter ([place]) ?Active Problems: ?  Atrial fibrillation with RVR ([place]) ? ? ?Plan: ?#New onset atrial flutter ?#Mitral stenosis ?HR continues to remain variable, fluctuating between 90-130s. Echocardiogram was performed and showed severe rheumatic mitral stenosis, EF of 55-60%, and no regional wall motion abnormalities. Plan for TEE today to get a better view of the mitral valve and evaluate if patient would be a candidate for  valvuloplasty vs MVR, as well as a possible cardioversion.  ?- Cardiology consulted, appreciate recs  ?- TEE today with possible cardioversion  ?- Continue dilt gtt, wean as able ?- Continue heparin drip  ? ?#Possible pulmonary edema ?BNP elevated to 511 on admission and CXR showed findings concerning for pulmonary edema. Echo with EF of 55-60%. Patient appears euvolemic at this time. ? ?#Hypertension ?Patient has a history of hypertension and has been prescribed HCTZ 12.5 mg, although she has not taken this medication in years. SBP in the 90-120s recently.  ?- Continue to hold HCTZ ? ?Best Practice: ?Diet: Cardiac diet after TEE ?IVF: Fluids: none ?VTE: Heparin ?Code: Full ?AB: none ?Therapy Recs: Pending ?Family Contact: Daughter, at bedside. ?DISPO: Anticipated discharge to Home pending Medical stability. ? ?Signature: ?Linden Tagliaferro, D.O.  ?Internal Medicine Resident, PGY-1 ?[facility]  ?Pager: [phone] ?6:36 AM, 05/27/2021  ? ?Please contact the on call pager after 5 pm and on weekends at [phone]. ? ?

## 2021-05-27 NOTE — Anesthesia Preprocedure Evaluation (Signed)
Anesthesia Evaluation  ?Patient identified by MRN, date of birth, ID band ?Patient awake ? ? ? ?Reviewed: ?Allergy & Precautions, NPO status , Patient's Chart, lab work & pertinent test results ? ?Airway ?Mallampati: III ? ?TM Distance: >3 FB ?Neck ROM: Full ? ? ? Dental ? ?(+) Dental Advisory Given ?  ?Pulmonary ?neg pulmonary ROS,  ?  ?Pulmonary exam normal ?breath sounds clear to auscultation ? ? ? ? ? ? Cardiovascular ?hypertension, Pt. on medications ?pulmonary hypertension+ Valvular Problems/Murmurs MR and AI  ?Rhythm:Irregular Rate:Tachycardia ? ?Echo 05/26/2021 ??1. Severe rheumatic mitral stenosis is present. MG 10.1 mmHG @ 95 bpm. PHT difficult due to Afib, but PHT ~140 msec which equates to MVA of 1.56 cm2. Leaflets are mildly thickened with calcification of the PMVL tip.  ?Anterior leaflet is mobile but the PMVL is restricted. There is mild MR. Would recommend TEE for better  ?characterization including MVA by direct 3D assessment. The mitral valve is rheumatic. Mild mitral valve regurgitation. Severe mitral stenosis. The mean mitral valve gradient is 10.1 mmHg with average heart rate of 95 bpm.  ??2. Left ventricular ejection fraction, by estimation, is 55 to 60%. The left ventricle has normal function. The left ventricle has no regional wall motion abnormalities. Left ventricular diastolic function could not be evaluated.  ??3. Right ventricular systolic function is normal. The right ventricular size is normal. There is mildly elevated pulmonary artery systolic pressure. The estimated right ventricular systolic pressure is 36.1 mmHg.  ??4. Left atrial size was severely dilated.  ??5. Right atrial size was mildly dilated.  ??6. The aortic valve is tricuspid. Aortic valve regurgitation is mild. No aortic stenosis is present.  ??7. The inferior vena cava is normal in size with <50% respiratory variability, suggesting right atrial pressure of 8 mmHg.  ?  ?Neuro/Psych ?  Headaches,   ? GI/Hepatic ?Neg liver ROS, GERD  ,  ?Endo/Other  ?negative endocrine ROS ? Renal/GU ?negative Renal ROS  ? ?  ?Musculoskeletal ?negative musculoskeletal ROS ?(+)  ? Abdominal ?(+) + obese,   ?Peds ? Hematology ?negative hematology ROS ?(+)   ?Anesthesia Other Findings ? ? Reproductive/Obstetrics ? ?  ? ? ? ? ? ? ? ? ? ? ? ? ? ?  ?  ? ? ? ? ? ? ? ? ?Anesthesia Physical ?Anesthesia Plan ? ?ASA: 4 ? ?Anesthesia Plan: General  ? ?Post-op Pain Management: Minimal or no pain anticipated  ? ?Induction: Intravenous ? ?PONV Risk Score and Plan: 3 and Propofol infusion and Treatment may vary due to age or medical condition ? ?Airway Management Planned: Natural Airway ? ?Additional Equipment:  ? ?Intra-op Plan:  ? ?Post-operative Plan:  ? ?Informed Consent: I have reviewed the patients History and Physical, chart, labs and discussed the procedure including the risks, benefits and alternatives for the proposed anesthesia with the patient or authorized representative who has indicated his/her understanding and acceptance.  ? ? ? ?Dental advisory given ? ?Plan Discussed with: CRNA ? ?Anesthesia Plan Comments:   ? ? ? ? ? ? ?Anesthesia Quick Evaluation ? ?

## 2021-05-27 NOTE — Care Management (Signed)
Requested CMA to schedule at [facility] to establish PCP and have hospital follow up. ?Patient added to [facility] system. Please send DC meds through [facility] pharmacy ?

## 2021-05-27 NOTE — Progress Notes (Signed)
Internal Medicine Clinic Attending  I saw and evaluated the patient.  I personally confirmed the key portions of the history and exam documented by Dr. Patel and I reviewed pertinent patient test results.  The assessment, diagnosis, and plan were formulated together and I agree with the documentation in the resident's note.  

## 2021-05-28 ENCOUNTER — Encounter (HOSPITAL_COMMUNITY): Payer: Self-pay | Admitting: Cardiology

## 2021-05-28 LAB — CBC
HCT: 34.8 % — ABNORMAL LOW (ref 36.0–46.0)
Hemoglobin: 11.5 g/dL — ABNORMAL LOW (ref 12.0–15.0)
MCH: 28.5 pg (ref 26.0–34.0)
MCHC: 33 g/dL (ref 30.0–36.0)
MCV: 86.4 fL (ref 80.0–100.0)
Platelets: 215 10*3/uL (ref 150–400)
RBC: 4.03 MIL/uL (ref 3.87–5.11)
RDW: 12.9 % (ref 11.5–15.5)
WBC: 11.8 10*3/uL — ABNORMAL HIGH (ref 4.0–10.5)
nRBC: 0.4 % — ABNORMAL HIGH (ref 0.0–0.2)

## 2021-05-28 LAB — BASIC METABOLIC PANEL
Anion gap: 9 (ref 5–15)
BUN: 6 mg/dL (ref 6–20)
CO2: 20 mmol/L — ABNORMAL LOW (ref 22–32)
Calcium: 8.6 mg/dL — ABNORMAL LOW (ref 8.9–10.3)
Chloride: 112 mmol/L — ABNORMAL HIGH (ref 98–111)
Creatinine, Ser: 0.7 mg/dL (ref 0.44–1.00)
GFR, Estimated: >60 mL/min (ref >60)
Glucose, Bld: 122 mg/dL — ABNORMAL HIGH (ref 70–99)
Potassium: 3.7 mmol/L (ref 3.5–5.1)
Sodium: 141 mmol/L (ref 135–145)

## 2021-05-28 LAB — HEPARIN LEVEL (UNFRACTIONATED)
Heparin Unfractionated: 0.26 IU/mL — ABNORMAL LOW (ref 0.30–0.70)
Heparin Unfractionated: 0.41 IU/mL (ref 0.30–0.70)

## 2021-05-28 LAB — MAGNESIUM: Magnesium: 1.8 mg/dL (ref 1.7–2.4)

## 2021-05-28 LAB — ECHO TEE: Area-P 1/2: 1.54 cm2

## 2021-05-28 MED ORDER — DIGOXIN 0.25 MG/ML IJ SOLN
0.5000 mg | Freq: Once | INTRAMUSCULAR | Status: AC
  Administered 2021-05-28: 0.5 mg via INTRAVENOUS
  Filled 2021-05-28 (×3): qty 2

## 2021-05-28 MED ORDER — ALUM & MAG HYDROXIDE-SIMETH 200-200-20 MG/5ML PO SUSP
30.0000 mL | Freq: Four times a day (QID) | ORAL | Status: DC | PRN
  Administered 2021-05-28 – 2021-05-30 (×2): 30 mL via ORAL
  Filled 2021-05-28 (×3): qty 30

## 2021-05-28 MED ORDER — METOPROLOL TARTRATE 12.5 MG HALF TABLET
12.5000 mg | ORAL_TABLET | Freq: Four times a day (QID) | ORAL | Status: DC
  Administered 2021-05-28 – 2021-05-29 (×4): 12.5 mg via ORAL
  Filled 2021-05-28 (×4): qty 1

## 2021-05-28 MED ORDER — PNEUMOCOCCAL 20-VAL CONJ VACC 0.5 ML IM SUSY
0.5000 mL | PREFILLED_SYRINGE | INTRAMUSCULAR | Status: DC
  Filled 2021-05-28: qty 0.5

## 2021-05-28 NOTE — Care Management (Addendum)
?  Transition of Care (TOC) Screening Note ? ? ?Patient Details  ?Name: Abigail Butler ?Date of Birth: May 16, 1965 ? ? ?Transition of Care (TOC) CM/SW Contact:    ?Graves-Bigelow, Ocie Cornfield, RN ?Phone Number: ?05/28/2021, 12:05 PM ? ? ? ?Transition of Care Department [facility]) has reviewed the patient. Consult received that patient has no insurance. Appointment was scheduled for the [facility] and the appointment is on the AVS. Patient has been added to [facility] system, if the patient discharges by Friday- medications can be sent to [place]. If the patient discharges over the weekend, patient will need to got to one of the pharmacies on the [facility] list. No further needs from Case Manager at this time.  ? ?1519 05-28-21 Case Manager sent a message to [place] for [facility]- will await phone call to see if the charity agency can assist with services.  ?
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
## 2021-05-28 NOTE — Progress Notes (Signed)
? ?  HD#3 ?SUBJECTIVE:  ?Patient Summary: Abigail Butler is a 56 y.o. with a pertinent PMH of hypertension, GERD, previous ischemic CVA, and previous history of latent TB s/p treatment, who presented with chest tightness and admitted for new onset atrial flutter with variable AV block ? ?Overnight Events: No acute events overnight ? ?Interim History: This is hospital day 3 for this patient who was seen and evaluated with her grandson is in the room. She feels good today, just has a sore throat from the procedure yesterday. She denies any chest pain. She does not have a headache today.  ? ?OBJECTIVE:  ?Vital Signs: ?Vitals:  ? 05/27/21 2115 05/27/21 2320 05/28/21 0138 05/28/21 0500  ?BP: 117/73 (!) 134/54 106/60 (!) 105/59  ?Pulse: 93 72 95 92  ?Resp: (!) 26 (!) 23 17 20   ?Temp:  99.2 ?F (37.3 ?C) 99.6 ?F (37.6 ?C) 99 ?F (37.2 ?C)  ?TempSrc:  Oral Oral Oral  ?SpO2: 95% 92% 92% 93%  ?Weight:      ?Height:      ? ?Supplemental O2: Room Air ?SpO2: 93 % ? ?Filed Weights  ? 05/27/21 1200  ?Weight: 82.6 kg  ? ? ? ?Intake/Output Summary (Last 24 hours) at 05/28/2021 0706 ?Last data filed at 05/28/2021 0546 ?Gross per 24 hour  ?Intake 300 ml  ?Output 600 ml  ?Net -300 ml  ? ?Net IO Since Admission: 1,945.74 mL [05/28/21 0706] ? ?Physical Exam: ?General: Pleasant, well-appearing middle aged laying in bed. No acute distress. ?CV: Tachycardic rate, irregular rhythm. No murmurs, rubs, or gallops appreciated. No LE edema ?Pulmonary: Lungs CTAB. Normal effort. No wheezing or rales. ?Abdominal: Soft, nontender, nondistended. Normal bowel sounds. ?Extremities: Palpable radial and DP pulses. Normal ROM. ?Skin: Warm and dry.  ?Neuro: A&Ox3. No focal deficit. ?Psych: Normal mood and affect ? ? ? ? ?ASSESSMENT/PLAN:  ?Assessment: ?Principal Problem: ?  Atrial flutter ([place]) ?Active Problems: ?  Atrial fibrillation with RVR ([place]) ?  Mitral stenosis with insufficiency, rheumatic ? ? ?Plan: ?#New onset atrial flutter ?#Severe mitral  stenosis ?#LAA thrombus ?Patient had TEE with cardiology yesterday and was unable to be cardioverted as she was found to have a large LAA thrombus and severe mitral stenosis. HR continues to remain variable on the dilt drip, ranging from 100-120s. CV surgery is going to be contacted in regards to a MV replacement.  ?- Cardiology consulted, appreciate recs ?- Continue dilt drip, wean as able ?- Start metoprolol tartrate 12.5 mg four times daily ?- Digoxin 0.5 mg IV once ?- Continue heparin drip, bridge to warfarin  ? ?#Hypertension ?BP has been low, most recently 105/59 while on the dilt drip. Will continue to hold her old home med, HCTZ 12.5 mg. Started on low dose metoprolol as noted above. Will continue to monitor.  ? ?#CHF ruled out ?BNP elevated to 511 and initial CXR with findings of pulmonary edema on admission. Echo showed EF of 55-60% and severe mitral stenosis. She remains euvolemic on exam and clinically does not have CHF. Does not need diuresis.  ? ?Best Practice: ?Diet: Cardiac diet ?IVF: Fluids: none ?VTE: Heparin ?Code: Full ?AB: none ?Therapy Recs: Home health PT ?Family Contact: Grandson, to be notified. ?DISPO: Anticipated discharge to Home pending Medical stability. ? ?Signature: ?Jaamal Farooqui, D.O.  ?Internal Medicine Resident, PGY-1 ?[facility]  ?Pager: [phone] ?7:06 AM, 05/28/2021  ? ?Please contact the on call pager after 5 pm and on weekends at [phone]. ? ?
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
## 2021-05-28 NOTE — Progress Notes (Signed)
? ?Progress Note ? ?Patient Name: Abigail Butler ?Date of Encounter: 05/28/2021 ? ?[facility] Cardiologist: Garwin Brothers, MD  ? ?Subjective  ? ?Pt says breathing is short   NO CP   Headache improved    Nausea improved  ? ?Inpatient Medications  ?  ?Scheduled Meds: ? ?Continuous Infusions: ? sodium chloride 10 mL/hr at 05/27/21 0644  ? diltiazem (CARDIZEM) infusion 15 mg/hr (05/28/21 0310)  ? heparin 1,550 Units/hr (05/28/21 7510)  ? ?PRN Meds: ?acetaminophen, metoprolol tartrate, ondansetron (ZOFRAN) IV  ? ?Vital Signs  ?  ?Vitals:  ? 05/27/21 2320 05/28/21 0138 05/28/21 0500 05/28/21 2585  ?BP: (!) 134/54 106/60 (!) 105/59 (!) 122/53  ?Pulse: 72 95 92 (!) 109  ?Resp: (!) 23 17 20 20   ?Temp: 99.2 ?F (37.3 ?C) 99.6 ?F (37.6 ?C) 99 ?F (37.2 ?C) 98.3 ?F (36.8 ?C)  ?TempSrc: Oral Oral Oral Oral  ?SpO2: 92% 92% 93% 95%  ?Weight:      ?Height:      ? ? ?Intake/Output Summary (Last 24 hours) at 05/28/2021 0837 ?Last data filed at 05/28/2021 0546 ?Gross per 24 hour  ?Intake 300 ml  ?Output 600 ml  ?Net -300 ml  ? ? ?  05/27/2021  ? 12:00 PM 12/08/2017  ?  2:08 PM 05/18/2017  ? 10:07 AM  ?Last 3 Weights  ?Weight (lbs) 182 lb 183 lb 9.6 oz 191 lb  ?Weight (kg) 82.555 kg 83.28 kg 86.637 kg  ?   ? ?Telemetry  ?  ?Afib ventricular rates 110-160s, pauses less than 2 sec - Personally Reviewed ? ?ECG  ?  ?No new tracings - Personally Reviewed ? ?Physical Exam  ? ?GEN: No acute distress.   ?Neck: No JVD ?Cardiac: Irreg irreg  No S3   I/VI diastolic apex    ?Respiratory: Clear to auscultation bilaterally. ?GI: Soft, nontender, non-distended  ?MS: No edema; No deformity. ?Neuro:  Nonfocal  ?Psych: Normal affect  ? ?Labs  ?  ?High Sensitivity Troponin:   ?Recent Labs  ?Lab 05/25/21 ?1532 05/25/21 ?1714  ?TROPONINIHS 12 13  ?   ?Chemistry ?Recent Labs  ?Lab 05/25/21 ?1532 05/25/21 ?2026 05/26/21 ?0352 05/27/21 ?0330 05/28/21 ?0257  ?NA 139  --  140 140 141  ?K 3.7  --  3.7 3.6 3.7  ?CL 110  --  112* 112* 112*  ?CO2 23  --  19* 18* 20*   ?GLUCOSE 122*  --  123* 124* 122*  ?BUN 9  --  12 8 6   ?CREATININE 0.75  --  0.89 0.61 0.70  ?CALCIUM 9.1  --  8.4* 8.6* 8.6*  ?MG  --  1.8  --  1.8 1.8  ?PROT 7.5  --   --   --   --   ?ALBUMIN 3.7  --   --   --   --   ?AST 36  --   --   --   --   ?ALT 47*  --   --   --   --   ?ALKPHOS 74  --   --   --   --   ?BILITOT 0.4  --   --   --   --   ?GFRNONAA >60  --  >60 >60 >60  ?ANIONGAP 6  --  9 10 9   ?  ?Lipids No results for input(s): CHOL, TRIG, HDL, LABVLDL, LDLCALC, CHOLHDL in the last 168 hours.  ?Hematology ?Recent Labs  ?Lab 05/26/21 ?0352 05/27/21 ?0330 05/28/21 ?0257  ?WBC 11.9*  10.2 11.8*  ?RBC 4.50 4.30 4.03  ?HGB 12.9 12.2 11.5*  ?HCT 39.8 38.1 34.8*  ?MCV 88.4 88.6 86.4  ?MCH 28.7 28.4 28.5  ?MCHC 32.4 32.0 33.0  ?RDW 12.5 12.9 12.9  ?PLT 233 206 215  ? ?Thyroid  ?Recent Labs  ?Lab 05/25/21 ?1532  ?TSH 0.862  ?  ?BNP ?Recent Labs  ?Lab 05/25/21 ?1720  ?BNP 511.1*  ?  ?DDimer No results for input(s): DDIMER in the last 168 hours.  ? ?Radiology  ?  ?ECHOCARDIOGRAM COMPLETE ? ?Result Date: 05/26/2021 ?   ECHOCARDIOGRAM REPORT   Patient Name:   Abigail Butler Date of Exam: 05/26/2021 Medical Rec #:  [id number]    Height:       65.0 in Accession #:    [id number]   Weight:       183.6 lb Date of Birth:  1966/01/26     BSA:          1.907 m? Patient Age:    56 years     BP:           104/50 mmHg Patient Gender: F            HR:           88 bpm. Exam Location:  Inpatient Procedure: 2D Echo, Cardiac Doppler and Color Doppler Indications:    Atrial Flutter I48.92  History:        Patient has no prior history of Echocardiogram examinations.  Sonographer:    Roosvelt Maser RDCS Referring Phys: [id number] Gloris [place] IMPRESSIONS  1. Severe rheumatic mitral stenosis is present. MG 10.1 mmHG @ 95 bpm. PHT difficult due to Afib, but PHT ~140 msec which equates to MVA of 1.56 cm2. Leaflets are mildly thickened with calcification of the PMVL tip. Anterior leaflet is mobile but the PMVL is restricted. There is mild MR. Would recommend  TEE for better characterization including MVA by direct 3D assessment. The mitral valve is rheumatic. Mild mitral valve regurgitation. Severe mitral stenosis. The mean mitral valve gradient is 10.1 mmHg with average heart rate of 95 bpm.  2. Left ventricular ejection fraction, by estimation, is 55 to 60%. The left ventricle has normal function. The left ventricle has no regional wall motion abnormalities. Left ventricular diastolic function could not be evaluated.  3. Right ventricular systolic function is normal. The right ventricular size is normal. There is mildly elevated pulmonary artery systolic pressure. The estimated right ventricular systolic pressure is 36.1 mmHg.  4. Left atrial size was severely dilated.  5. Right atrial size was mildly dilated.  6. The aortic valve is tricuspid. Aortic valve regurgitation is mild. No aortic stenosis is present.  7. The inferior vena cava is normal in size with <50% respiratory variability, suggesting right atrial pressure of 8 mmHg. FINDINGS  Left Ventricle: Left ventricular ejection fraction, by estimation, is 55 to 60%. The left ventricle has normal function. The left ventricle has no regional wall motion abnormalities. The left ventricular internal cavity size was normal in size. There is  no left ventricular hypertrophy. Left ventricular diastolic function could not be evaluated due to atrial fibrillation. Left ventricular diastolic function could not be evaluated. Right Ventricle: The right ventricular size is normal. No increase in right ventricular wall thickness. Right ventricular systolic function is normal. There is mildly elevated pulmonary artery systolic pressure. The tricuspid regurgitant velocity is 2.65  m/s, and with an assumed right atrial pressure of 8 mmHg, the estimated right ventricular systolic pressure is  36.1 mmHg. Left Atrium: Left atrial size was severely dilated. Right Atrium: Right atrial size was mildly dilated. Pericardium: There is no  evidence of pericardial effusion. Mitral Valve: Severe rheumatic mitral stenosis is present. MG 10.1 mmHG @ 95 bpm. PHT difficult due to Afib, but PHT ~140 msec which equates to MVA of 1.56 cm2. Leaflets are mildly thickened with calcification of the PMVL tip. Anterior leaflet is mobile but the PMVL is restricted. There is mild MR. Would recommend TEE for better characterization including MVA by direct 3D assessment. The mitral valve is rheumatic. Mild mitral valve regurgitation. Severe mitral valve stenosis. MV peak gradient, 21.2 mmHg. The mean mitral valve gradient is 10.1 mmHg with average heart rate of 95 bpm. Tricuspid Valve: The tricuspid valve is grossly normal. Tricuspid valve regurgitation is mild . No evidence of tricuspid stenosis. Aortic Valve: The aortic valve is tricuspid. Aortic valve regurgitation is mild. Aortic regurgitation PHT measures 841 msec. No aortic stenosis is present. Pulmonic Valve: The pulmonic valve was grossly normal. Pulmonic valve regurgitation is not visualized. No evidence of pulmonic stenosis. Aorta: The aortic root and ascending aorta are structurally normal, with no evidence of dilitation. Venous: The inferior vena cava is normal in size with less than 50% respiratory variability, suggesting right atrial pressure of 8 mmHg. IAS/Shunts: The atrial septum is grossly normal.  LEFT VENTRICLE PLAX 2D LVIDd:         4.50 cm LVIDs:         3.30 cm LV PW:         1.00 cm LV IVS:        1.00 cm LVOT diam:     2.20 cm LV SV:         62 LV SV Index:   33 LVOT Area:     3.80 cm?  LV Volumes (MOD) LV vol d, MOD A4C: 47.6 ml LV SV MOD A4C:     47.6 ml RIGHT VENTRICLE RV Basal diam:  3.50 cm RV S prime:     11.70 cm/s TAPSE (M-mode): 1.8 cm LEFT ATRIUM              Index        RIGHT ATRIUM           Index LA diam:        4.30 cm  2.25 cm/m?   RA Area:     14.90 cm? LA Vol (A2C):   117.0 ml 61.34 ml/m?  RA Volume:   36.40 ml  19.08 ml/m? LA Vol (A4C):   97.7 ml  51.22 ml/m? LA Biplane Vol:  109.0 ml 57.14 ml/m?  AORTIC VALVE LVOT Vmax:   82.00 cm/s LVOT Vmean:  60.100 cm/s LVOT VTI:    0.164 m AI PHT:      841 msec  AORTA Ao Root diam: 2.80 cm Ao Asc diam:  3.10 cm MITRAL VALVE

## 2021-05-29 LAB — CBC
HCT: 35 % — ABNORMAL LOW (ref 36.0–46.0)
Hemoglobin: 11.4 g/dL — ABNORMAL LOW (ref 12.0–15.0)
MCH: 28.3 pg (ref 26.0–34.0)
MCHC: 32.6 g/dL (ref 30.0–36.0)
MCV: 86.8 fL (ref 80.0–100.0)
Platelets: 214 10*3/uL (ref 150–400)
RBC: 4.03 MIL/uL (ref 3.87–5.11)
RDW: 13 % (ref 11.5–15.5)
WBC: 11.1 10*3/uL — ABNORMAL HIGH (ref 4.0–10.5)
nRBC: 0.4 % — ABNORMAL HIGH (ref 0.0–0.2)

## 2021-05-29 LAB — HEPARIN LEVEL (UNFRACTIONATED): Heparin Unfractionated: 0.4 IU/mL (ref 0.30–0.70)

## 2021-05-29 LAB — BASIC METABOLIC PANEL
Anion gap: 8 (ref 5–15)
BUN: 8 mg/dL (ref 6–20)
CO2: 22 mmol/L (ref 22–32)
Calcium: 8.6 mg/dL — ABNORMAL LOW (ref 8.9–10.3)
Chloride: 110 mmol/L (ref 98–111)
Creatinine, Ser: 0.7 mg/dL (ref 0.44–1.00)
GFR, Estimated: >60 mL/min (ref >60)
Glucose, Bld: 110 mg/dL — ABNORMAL HIGH (ref 70–99)
Potassium: 3.6 mmol/L (ref 3.5–5.1)
Sodium: 140 mmol/L (ref 135–145)

## 2021-05-29 LAB — MAGNESIUM: Magnesium: 1.9 mg/dL (ref 1.7–2.4)

## 2021-05-29 MED ORDER — METOPROLOL TARTRATE 25 MG PO TABS
25.0000 mg | ORAL_TABLET | Freq: Two times a day (BID) | ORAL | Status: DC
  Administered 2021-05-29: 25 mg via ORAL
  Filled 2021-05-29: qty 1

## 2021-05-29 MED ORDER — DILTIAZEM HCL 60 MG PO TABS
60.0000 mg | ORAL_TABLET | Freq: Four times a day (QID) | ORAL | Status: DC
  Administered 2021-05-29 – 2021-05-30 (×4): 60 mg via ORAL
  Filled 2021-05-29 (×4): qty 1

## 2021-05-29 MED ORDER — WARFARIN SODIUM 7.5 MG PO TABS
7.5000 mg | ORAL_TABLET | Freq: Once | ORAL | Status: AC
  Administered 2021-05-29: 7.5 mg via ORAL
  Filled 2021-05-29: qty 1

## 2021-05-29 MED ORDER — WARFARIN - PHARMACIST DOSING INPATIENT: Freq: Every day | Status: DC

## 2021-05-29 MED ORDER — ENOXAPARIN SODIUM 80 MG/0.8ML IJ SOSY: 80.0000 mg | PREFILLED_SYRINGE | Freq: Two times a day (BID) | INTRAMUSCULAR | Status: DC

## 2021-05-29 MED ORDER — DIGOXIN 250 MCG PO TABS
0.2500 mg | ORAL_TABLET | Freq: Every day | ORAL | Status: DC
  Administered 2021-05-29 – 2021-06-03 (×6): 0.25 mg via ORAL
  Filled 2021-05-29 (×6): qty 1

## 2021-05-29 MED ORDER — SENNOSIDES-DOCUSATE SODIUM 8.6-50 MG PO TABS
2.0000 | ORAL_TABLET | Freq: Every day | ORAL | Status: DC
Start: 2021-05-29 — End: 2021-06-03
  Filled 2021-05-29 (×4): qty 2

## 2021-05-29 MED ORDER — ENOXAPARIN SODIUM 80 MG/0.8ML IJ SOSY
80.0000 mg | PREFILLED_SYRINGE | Freq: Two times a day (BID) | INTRAMUSCULAR | Status: DC
  Administered 2021-05-29 – 2021-06-01 (×7): 80 mg via SUBCUTANEOUS
  Filled 2021-05-29 (×7): qty 0.8

## 2021-05-29 NOTE — Progress Notes (Signed)
Pt complained of "chest tightness" in her upper abdominal region; pt stated that she has not had a bowel movement since 05/25/21 and not passing flatus. Previous electronic PRN order for Maalox administered with relief. Will continue to monitor.  ? ?Bari Edward, RN ? ?

## 2021-05-29 NOTE — Discharge Instructions (Signed)
Information on my medicine - Coumadin®   (Warfarin) ° °This medication education was reviewed with me or my healthcare representative as part of my discharge preparation.  The pharmacist that spoke with me during my hospital stay was:  Joshalyn Ancheta T Cashmere Dingley, RPH ° °Why was Coumadin prescribed for you? °Coumadin was prescribed for you because you have a blood clot or a medical condition that can cause an increased risk of forming blood clots. Blood clots can cause serious health problems by blocking the flow of blood to the heart, lung, or brain. Coumadin can prevent harmful blood clots from forming. °As a reminder your indication for Coumadin is:  Stroke Prevention because of Atrial Fibrillation ° °What test will check on my response to Coumadin? °While on Coumadin (warfarin) you will need to have an INR test regularly to ensure that your dose is keeping you in the desired range. The INR (international normalized ratio) number is calculated from the result of the laboratory test called prothrombin time (PT). ° °If an INR APPOINTMENT HAS NOT ALREADY BEEN MADE FOR YOU please schedule an appointment to have this lab work done by your health care provider within 7 days. °Your INR goal is usually a number between:  2 to 3 or your provider may give you a more narrow range like 2-2.5.  Ask your health care provider during an office visit what your goal INR is. ° °What  do you need to  know  About  COUMADIN? °Take Coumadin (warfarin) exactly as prescribed by your healthcare provider about the same time each day.  DO NOT stop taking without talking to the doctor who prescribed the medication.  Stopping without other blood clot prevention medication to take the place of Coumadin may increase your risk of developing a new clot or stroke.  Get refills before you run out. ° °What do you do if you miss a dose? °If you miss a dose, take it as soon as you remember on the same day then continue your regularly scheduled regimen the next  day.  Do not take two doses of Coumadin at the same time. ° °Important Safety Information °A possible side effect of Coumadin (Warfarin) is an increased risk of bleeding. You should call your healthcare provider right away if you experience any of the following: °Bleeding from an injury or your nose that does not stop. °Unusual colored urine (red or dark brown) or unusual colored stools (red or black). °Unusual bruising for unknown reasons. °A serious fall or if you hit your head (even if there is no bleeding). ° °Some foods or medicines interact with Coumadin® (warfarin) and might alter your response to warfarin. To help avoid this: °Eat a balanced diet, maintaining a consistent amount of Vitamin K. °Notify your provider about major diet changes you plan to make. °Avoid alcohol or limit your intake to 1 drink for women and 2 drinks for men per day. °(1 drink is 5 oz. wine, 12 oz. beer, or 1.5 oz. liquor.) ° °Make sure that ANY health care provider who prescribes medication for you knows that you are taking Coumadin (warfarin).  Also make sure the healthcare provider who is monitoring your Coumadin knows when you have started a new medication including herbals and non-prescription products. ° °Coumadin® (Warfarin)  Major Drug Interactions  °Increased Warfarin Effect Decreased Warfarin Effect  °Alcohol (large quantities) °Antibiotics (esp. Septra/Bactrim, Flagyl, Cipro) °Amiodarone (Cordarone) °Aspirin (ASA) °Cimetidine (Tagamet) °Megestrol (Megace) °NSAIDs (ibuprofen, naproxen, etc.) °Piroxicam (Feldene) °Propafenone (Rythmol SR) °Propranolol (  Inderal) °Isoniazid (INH) °Posaconazole (Noxafil) Barbiturates (Phenobarbital) °Carbamazepine (Tegretol) °Chlordiazepoxide (Librium) °Cholestyramine (Questran) °Griseofulvin °Oral Contraceptives °Rifampin °Sucralfate (Carafate) °Vitamin K  ° °Coumadin® (Warfarin) Major Herbal Interactions  °Increased Warfarin Effect Decreased Warfarin Effect  °Garlic °Ginseng °Ginkgo biloba  Coenzyme Q10 °Green tea °St. John’s wort   ° °Coumadin® (Warfarin) FOOD Interactions  °Eat a consistent number of servings per week of foods HIGH in Vitamin K °(1 serving = ½ cup)  °Collards (cooked, or boiled & drained) °Kale (cooked, or boiled & drained) °Mustard greens (cooked, or boiled & drained) °Parsley *serving size only = ¼ cup °Spinach (cooked, or boiled & drained) °Swiss chard (cooked, or boiled & drained) °Turnip greens (cooked, or boiled & drained)  °Eat a consistent number of servings per week of foods MEDIUM-HIGH in Vitamin K °(1 serving = 1 cup)  °Asparagus (cooked, or boiled & drained) °Broccoli (cooked, boiled & drained, or raw & chopped) °Brussel sprouts (cooked, or boiled & drained) *serving size only = ½ cup °Lettuce, raw (green leaf, endive, romaine) °Spinach, raw °Turnip greens, raw & chopped  ° °These websites have more information on Coumadin (warfarin):  www.coumadin.com; °www.ahrq.gov/consumer/coumadin.htm; ° °Information on my medicine - LOVENOX® (enoxaparin) ° °This medication education was reviewed with me or my healthcare representative as part of my discharge preparation.  The pharmacist that spoke with me during my hospital stay was: Valmore Arabie T Karely Hurtado, RPH ° ° °Why was lovenox® prescribed for you? °Lovenox® was prescribed to treat blood clots that may have been found in the veins of your legs (deep vein thrombosis) or in your lungs (pulmonary embolism) and to reduce the risk of them occurring again. ° °What do You need to know about Lovenox®? °Lovenox®  is given by a shot under the skin by you or a care provider. Lovenox® is injected into the right or left side of your abdomen at least 2 inches away from your belly button. Clean the injection site with an alcohol swab and let dry. Give only the amount prescribed to you by your healthcare provider and do not change the dose unless instructed by your health care provider. ° °Try to administer the dose(s) about the same time every day.  To minimize bruising and irritation, rotate where the shots are given. ° °Store Lovenox® at room temperature, away from heat and direct light. Do not refrigerate or freeze the syringes. Keep away from children or pets. ° °Take Lovenox® exactly as prescribed and DO NOT stop taking Lovenox® without talking to the doctor who prescribed the medication.  Stopping may increase your risk of developing a new blood clot.   ° °After discharge, you should have regular appointments with your healthcare provider. ° °Dispose of used syringe and cap in a sharps disposal container once used (if sharps disposal container unavailable, use any hard plastic jug that can be securely closed).  °   °What do you do if you miss a dose? °If a dose of Lovenox® is not administered at the scheduled time, administer it as soon as possible on the same day and then resume regular administration schedule. The dose should not be doubled to make up for a missed dose. ° °Important Safety Information °A possible side effect of Lovenox® is bleeding. You should call your healthcare provider right away if you experience any of the following: °Bleeding from an injury or your nose that does not stop. °Unusual colored urine (red or dark brown) or unusual colored stools (red or black). °Unusual bruising for unknown reasons. °  A serious fall or if you hit your head (even if there is no bleeding). ° °Some medicines may interact with Lovenox® and might increase your risk of bleeding or clotting while on Lovenox®. To help avoid this, consult your healthcare provider or pharmacist prior to using any new prescription or non-prescription medications, including herbals, vitamins, non-steroidal anti-inflammatory drugs (NSAIDs) and supplements. ° °This website has more information on Lovenox® (enoxaparin): www.Lovenox.com. ° °

## 2021-05-29 NOTE — Progress Notes (Signed)
Initial visit with Gust Brooms and her daughter at pt's bedside. Chaplain informed family that a consult had been placed for Advance Directives and provided basic education that Advance Directives are documents which allow an individual to document their wishes in advance of a time when they could not name them for themselves. The Health Care power of attorney designates who they would like to make medical decisions on their behalf if they were unable to do so themselves and the living will designates whether they would want specific life sustaining care if diagnosed with a life limiting condition. Laquilla and her daughter shared that they did not fully understand and wanted to call Musette's grandson who could listen in and provide further assistance. He was unavailable. Chaplain explained that the documents did not have to be completed at this time, or at all and asked if the family would like to review them with Nga's grandson at a more convenient time. The family affirmed that they would like to defer this decision. Chaplain explained that further education about the documents can be found in the first pages and notified the family to have their nurse contact spiritual care if they would like a follow up visit for further education or to have the documents completed. Janisha agreed with this plan and expressed gratitude for the visit.  ? ?Please page as further needs arise. ? ?Maryanna Shape. Carley Hammed, M.Div. [facility] ?Chaplain ?Pager [phone] ?Office [phone] ? ?

## 2021-05-29 NOTE — Progress Notes (Signed)
? ?  HD#4 ?SUBJECTIVE:  ?Patient Summary: Abigail Butler is a 56 y.o. with a pertinent PMH of hypertension, GERD, previous ischemic CVA, and previous history of latent TB s/p treatment, who presented with chest tightness and admitted for new onset atrial flutter with variable AV block ? ?Overnight Events: No acute events overnight ? ?Interim History: This is hospital day 4 for this patient who was seen and evaluated with her daughter at the bedside. She states that she no longer has any chest pain, nausea, or headache. Her throat pain is improved today, as well.  ? ?OBJECTIVE:  ?Vital Signs: ?Vitals:  ? 05/28/21 1725 05/28/21 2027 05/28/21 2312 05/29/21 0352  ?BP: (!) 110/45 109/66 (!) 120/92 (!) 97/53  ?Pulse: 89 66 (!) 106 66  ?Resp: 20 (!) 22 18   ?Temp: 98 ?F (36.7 ?C) 98.4 ?F (36.9 ?C) 98.5 ?F (36.9 ?C) 98.6 ?F (37 ?C)  ?TempSrc: Oral Oral Oral Oral  ?SpO2: 93% 96%  96%  ?Weight:      ?Height:      ? ?Supplemental O2: Room Air ?SpO2: 96 % ? ?Filed Weights  ? 05/27/21 1200  ?Weight: 82.6 kg  ? ? ? ?Intake/Output Summary (Last 24 hours) at 05/29/2021 0740 ?Last data filed at 05/29/2021 0732 ?Gross per 24 hour  ?Intake --  ?Output 300 ml  ?Net -300 ml  ? ?Net IO Since Admission: 1,645.74 mL [05/29/21 0740] ? ?Physical Exam: ?General: Pleasant, well-appearing middle aged female laying in bed. No acute distress. ?CV: Irregularly irregular. No murmurs, rubs, or gallops appreciated. No LE edema ?Pulmonary: Lungs CTAB. Normal effort. No wheezing or rales. ?Abdominal: Soft, nontender, nondistended. Normal bowel sounds. ?Extremities: Palpable radial and DP pulses.  ?Skin: Warm and dry.  ?Neuro: A&Ox3. No focal deficit. ? ? ? ?ASSESSMENT/PLAN:  ?Assessment: ?Principal Problem: ?  Atrial flutter ([place]) ?Active Problems: ?  Atrial fibrillation with RVR ([place]) ?  Mitral stenosis with insufficiency, rheumatic ? ? ?Plan: ? ?#New onset atrial fibrillation with RVR ?#Severe rheumatic mitral stenosis ?#LAA thrombus ?Patient had TEE with  cardiology on 3/29 and was unable to be cardioverted as she was found to have a large LAA thrombus and severe mitral stenosis. CV surgery to be contacted regarding MVR. Patient remains on dilt drip this morning and was started on metoprolol and digoxin yesterday, with a significant improvement in her heart rate noted. Will try to wean off dilt drip today. Patient symptomatically feels improved, with no chest pain or shortness of breath.  ?- Cardiology consulted, appreciate recs ?- Wean off diltiazem drip as able ?- Metoprolol tartrate 25 mg bid ?- Digoxin 0.25 mg daily ?- Start diltiazem 60 mg q6h  ?- Heparin drip and bridge to coumadin today ? ?#Hypertension ?Not on any medications at home. SBP 90-120s. Continue diltiazem and metoprolol as above.  ? ? ?Best Practice: ?Diet: Cardiac diet ?IVF: Fluids: none ?VTE: Heparin ?Code: Full ?AB: none ?Therapy Recs: Home Health, DME: none ?Family Contact: Daughter, at bedside. ?DISPO: Anticipated discharge in 1-3 days to Home pending Medical stability. ? ?Signature: ?Kindred Reidinger, D.O.  ?Internal Medicine Resident, PGY-1 ?[facility]  ?Pager: [phone] ?7:40 AM, 05/29/2021  ? ?Please contact the on call pager after 5 pm and on weekends at [phone]. ? ?

## 2021-05-29 NOTE — TOC Initial Note (Signed)
Transition of Care (TOC) - Initial/Assessment Note  ? ? ?Patient Details  ?Name: Abigail Butler ?MRN: [id number] ?Date of Birth: 09-07-1965 ? ?Transition of Care (TOC) CM/SW Contact:    ?Graves-Bigelow, Ocie Cornfield, RN ?Phone Number: ?05/29/2021, 1:58 PM ? ?Clinical Narrative:  Case Manager spoke with patient and family. Unable to use the video interpreter and the daughter is at the bedside willing to interpret. Case Manager explained that the patient will be able to utilize [facility] for medications to be $3.00 a piece. Rx's may include Lovenox as well. Daughter asked if the patients Rx's could be sent to [facility] asked pharmacy to change the Pharmacy of choice in Epic so the medications can be e-scribed. This location is affiliated with [facility]. [facility] paperwork will need to be given to patient prior to leaving. Form is in the shadow chart. Patient has a hospital follow up appointment scheduled and placed on the AVS. Case Manager was unable to schedule charity home health PT via [facility]. [facility] states they cannot accept the patient due to staffing capacity. Patient and daughter aware and per daughter, the patient will have family support during the day. Case Manager did order a charity rolling walker via [facility]. No further needs from Case Manager at this time.  ? ? ?Expected Discharge Plan: Home/Self Care ?Barriers to Discharge: Continued Medical Work up ? ? ?Patient Goals and CMS Choice ?Patient states their goals for this hospitalization and ongoing recovery are:: to return home ?  ?Choice offered to / list presented to : NA ? ?Expected Discharge Plan and Services ?Expected Discharge Plan: Home/Self Care ?  ?Discharge Planning Services: CM Consult ?  ?Living arrangements for the past 2 months: Apartment ?                ?DME Arranged: Walker rolling ?DME Agency: NA, [employer] ?Date DME Agency Contacted: 05/29/21 ?Time DME Agency Contacted: 1330 ?Representative spoke with at DME  Agency: Freda Munro ?HH Arranged: PT (Unable to get charity home health with [place].) ?[place] Agency: [place] ?  ?  ?Representative spoke with at [place]: [facility]- unable to accept for charity. ? ?Prior Living Arrangements/Services ?Living arrangements for the past 2 months: Apartment ?Lives with:: Self, Relatives ?Patient language and need for interpreter reviewed:: Yes ?Do you feel safe going back to the place where you live?: Yes      ?Need for Family Participation in Patient Care: Yes (Comment) ?Care giver support system in place?: Yes (comment) ?  ?Criminal Activity/Legal Involvement Pertinent to Current Situation/Hospitalization: No - Comment as needed ? ?Activities of Daily Living ?Home Assistive Devices/Equipment: None ?ADL Screening (condition at time of admission) ?Patient's cognitive ability adequate to safely complete daily activities?: Yes ?Is the patient deaf or have difficulty hearing?: No ?Does the patient have difficulty seeing, even when wearing glasses/contacts?: No ?Does the patient have difficulty concentrating, remembering, or making decisions?: No ?Patient able to express need for assistance with ADLs?: No ?Does the patient have difficulty dressing or bathing?: No ?Independently performs ADLs?: Yes (appropriate for developmental age) ?Does the patient have difficulty walking or climbing stairs?: Yes ?Weakness of Legs: Both ?Weakness of Arms/Hands: Right ? ?Permission Sought/Granted ?Permission sought to share information with : Facility Sport and exercise psychologist, Tourist information centre manager, Family Supports ?Permission granted to share information with : Yes, Verbal Permission Granted ?   ?   ?   ?   ? ?Emotional Assessment ?Appearance:: Appears stated age ?Attitude/Demeanor/Rapport: Engaged ?Affect (typically observed): Appropriate ?Orientation: :  Oriented to  Time, Oriented to Place, Oriented to Self, Oriented to Situation ?Alcohol / Substance Use: Not Applicable ?Psych Involvement: No  (comment) ? ?Admission diagnosis:  Atrial flutter ([place]) [I48.92] ?Atrial fibrillation with RVR ([place]) [I48.91] ?Acute congestive heart failure, unspecified heart failure type ([place]) [I50.9] ?Patient Active Problem List  ? Diagnosis Date Noted  ? Mitral stenosis with insufficiency, rheumatic   ? Atrial fibrillation with RVR ([place])   ? Atrial flutter ([place]) 05/25/2021  ? Gingivitis 12/08/2017  ? Hypertension 05/11/2017  ? Nonproductive cough 04/07/2017  ? Groin fullness 12/09/2016  ? Right arm pain 10/15/2016  ? Mass of left breast on mammogram 05/06/2015  ? H. pylori infection 04/03/2015  ? Lumbar disc disease with radiculopathy 10/15/2014  ? Prediabetes 10/15/2014  ? Health care maintenance 12/24/2013  ? H/O: CVA (cerebrovascular accident) 06/01/2013  ? GERD (gastroesophageal reflux disease) 06/01/2013  ? Vertigo 02/10/2012  ? ?PCP:  Jose Persia, MD ?Pharmacy:   ?[facility] [place] ?[address] ?[place] [address] ?Phone: [phone] Fax: [phone] ? ?[facility] ?[address] ?[place] [address] ?Phone: [phone] Fax: [phone] ? ?[id number] - [place], [place] - [address] ?[place] [address] ?Phone: [phone] Fax: [phone] ? ? ? ? ?Social Determinants of Health (SDOH) Interventions ?  ? ?Readmission Risk Interventions ?   ? View : No data to display.  ?  ?  ?  ? ? ? ?

## 2021-05-29 NOTE — Progress Notes (Signed)
Initial Nutrition Assessment ? ?DOCUMENTATION CODES:  ? ?Not applicable ? ?INTERVENTION:  ?Liberalize diet to regular ? ?Encourage adequate PO intake  ? ?NUTRITION DIAGNOSIS:  ? ?Inadequate oral intake related to nausea, vomiting (x 2 days PTA) as evidenced by per patient/family report. ? ? ?GOAL:  ? ?Patient will meet greater than or equal to 90% of their needs ? ? ?MONITOR:  ? ?PO intake, Weight trends, Labs ? ?REASON FOR ASSESSMENT:  ? ?Malnutrition Screening Tool ?  ? ?ASSESSMENT:  ? ?Patient is a 56 year old female admitted for atrial flutter after presenting to [facility] with chief complaint of chest tightness, palpitations, nausea and vomiting for the past two days. Medical history significant for GERD, HTN, ischemic CVA with no residual deficits, vertigo, previously treated for latent TB, h/o H. pylori infection. ? ?No meals completions documented in chart. Spoke with pt's nurse tech prior to entering room and per nurse tech, pt's daughter is present in room and able to translate for pt.  ? ?Met with pt at bedside. Pt's daughter present during visit who served as Optometrist for patient and was able to provide some of patient's history. Per pt, pt endorses a good appetite today, but also reports that she does not feel like she can eat meat today. For breakfast, pt ate potatoes, bread, fruit salad cup, and drank a cup of coffee. Pt denies any nausea or vomiting at this time and states that this has gotten better since admission. Patient having episodes of nausea and vomiting prior to admission. Per pt, pt was having decreased appetite for the past two months and pt states that she just didn't feel hungry during this time.Pt reports that she typically eats one meal a day and states that she eats different dishes from their culture. Pt's daughter reports that they started fasting last week and that pt had to stop fasting due to becoming ill with symptoms nausea/vomiting/diarrhea x 2 days. Pt's daughter reports that pt  does not eat pork for religious beliefs. Pt's daughter reports that pt eats red meat at home in their cultural dishes. ? ?Discussed the importance of adequate PO intake to maintain energy/strength and to meet nutritional needs. Discussed oral nutritional supplementation with pt and pt's daughter, but pt refused at this time. No nausea and vomiting today and pt tolerating PO intake well, so encouraged consistent meal intake as tolerable by the patient at this time. Discussed liberalizing diet with pt and pt's daughter to allow for more food options for patient that she enjoys and to promote PO intake and both agreeable to this plan. No further nutrition interventions at this time. Please consult RD if any nutrition concerns arise.  ? ?Per pt, pt unsure of any weight loss but states that she does not feel like she has lost weight physically on the outside, but that she feels weak inside. Pt's daughter reports that patient does not go see a doctor very often but she thinks the last time pt went to the doctor, she was around ~180#.  ? ?Reviewed weight history. ?Most recent weight: 82.6 kg (181.7#) on 05/27/21  ?Usual body weight: ~180# per patient's daughter     ? ?Medications reviewed and include:  ? senna-docusate  2 tablet Oral QHS  ? ?Labs reviewed.  ?A1C: 5.9  ? ?NUTRITION - FOCUSED PHYSICAL EXAM: ? ?Flowsheet Row Most Recent Value  ?Orbital Region No depletion  ?Upper Arm Region No depletion  ?Thoracic and Lumbar Region No depletion  ?Buccal Region No depletion  ?  Temple Region No depletion  ?Clavicle Bone Region No depletion  ?Clavicle and Acromion Bone Region No depletion  ?Scapular Bone Region Mild depletion  ?Dorsal Hand No depletion  ?Patellar Region No depletion  ?Anterior Thigh Region No depletion  ?Posterior Calf Region No depletion  ?Edema (RD Assessment) None  ?Hair Unable to assess  ?Eyes Reviewed  ?Mouth Reviewed  ?Skin Reviewed  ?Nails Reviewed  ? ?  ? ? ?Diet Order:   ?Diet Order   ? ?       ?  Diet  Heart Room service appropriate? Yes; Fluid consistency: Thin  Diet effective now       ?  ? ?  ?  ? ?  ? ? ?EDUCATION NEEDS:  ? ?No education needs have been identified at this time ? ?Skin:  Skin Assessment: Reviewed RN Assessment ? ?Last BM:  3/27; type 1 ? ?Height:  ? ?Ht Readings from Last 1 Encounters:  ?05/27/21 5' 5"  (1.651 m)  ? ? ?Weight:  ? ?Wt Readings from Last 1 Encounters:  ?05/27/21 82.6 kg  ? ? ?Ideal Body Weight:  56.8 kg ? ?BMI:  Body mass index is 30.29 kg/m?. ? ?Estimated Nutritional Needs:  ? ?Kcal:  2000 - 2200 ? ?Protein:  100 - 115 grams ? ?Fluid:  >/= 2 L ? ? ? ?Maryruth Hancock, Dietetic Intern ?05/29/2021 12:30 PM ?

## 2021-05-29 NOTE — Progress Notes (Signed)
? ?Progress Note ? ?Patient Name: Abigail Butler ?Date of Encounter: 05/29/2021 ? ?[facility] Cardiologist: Jenean Lindau, MD  ? ?Subjective  ? ?Breathing is OK in bed   NO CP   ? ?Inpatient Medications  ?  ?Scheduled Meds: ? digoxin  0.25 mg Oral Daily  ? diltiazem  60 mg Oral Q6H  ? metoprolol tartrate  25 mg Oral BID  ? pneumococcal 20-valent conjugate vaccine  0.5 mL Intramuscular Tomorrow-1000  ? senna-docusate  2 tablet Oral QHS  ? ?Continuous Infusions: ? sodium chloride 10 mL/hr at 05/28/21 2124  ? diltiazem (CARDIZEM) infusion 10 mg/hr (05/29/21 0533)  ? heparin 1,550 Units/hr (05/28/21 2123)  ? ?PRN Meds: ?acetaminophen, alum & mag hydroxide-simeth, metoprolol tartrate, ondansetron (ZOFRAN) IV  ? ?Vital Signs  ?  ?Vitals:  ? 05/28/21 2312 05/29/21 0352 05/29/21 0820 05/29/21 1121  ?BP: (!) 120/92 (!) 97/53 (!) 110/54 106/73  ?Pulse: (!) 106 66 83 72  ?Resp: 18  20 19   ?Temp: 98.5 ?F (36.9 ?C) 98.6 ?F (37 ?C) 98 ?F (36.7 ?C) 98.2 ?F (36.8 ?C)  ?TempSrc: Oral Oral Oral Oral  ?SpO2:  96% 94% 97%  ?Weight:      ?Height:      ? ? ?Intake/Output Summary (Last 24 hours) at 05/29/2021 1156 ?Last data filed at 05/29/2021 0732 ?Gross per 24 hour  ?Intake --  ?Output 300 ml  ?Net -300 ml  ? ? ?  05/27/2021  ? 12:00 PM 12/08/2017  ?  2:08 PM 05/18/2017  ? 10:07 AM  ?Last 3 Weights  ?Weight (lbs) 182 lb 183 lb 9.6 oz 191 lb  ?Weight (kg) 82.555 kg 83.28 kg 86.637 kg  ?   ? ?Telemetry  ?  ?Afib  80s - Personally Reviewed ? ?ECG  ?  ?No new tracings - Personally Reviewed ? ?Physical Exam  ? ?GEN: No acute distress.   ?Neck: No JVD ?Cardiac: Irreg irreg  No S3     ?Respiratory: Clear to auscultation bilaterally. ?GI: Soft, nontender, non-distended  ?MS: No edema; No deformity. ?Neuro:  Nonfocal  ?Psych: Normal affect  ? ?Labs  ?  ?High Sensitivity Troponin:   ?Recent Labs  ?Lab 05/25/21 ?1532 05/25/21 ?1714  ?TROPONINIHS 12 13  ?   ?Chemistry ?Recent Labs  ?Lab 05/25/21 ?1532 05/25/21 ?2026 05/27/21 ?0330 05/28/21 ?0257  05/29/21 ?0236  ?NA 139   < > 140 141 140  ?K 3.7   < > 3.6 3.7 3.6  ?CL 110   < > 112* 112* 110  ?CO2 23   < > 18* 20* 22  ?GLUCOSE 122*   < > 124* 122* 110*  ?BUN 9   < > 8 6 8   ?CREATININE 0.75   < > 0.61 0.70 0.70  ?CALCIUM 9.1   < > 8.6* 8.6* 8.6*  ?MG  --    < > 1.8 1.8 1.9  ?PROT 7.5  --   --   --   --   ?ALBUMIN 3.7  --   --   --   --   ?AST 36  --   --   --   --   ?ALT 47*  --   --   --   --   ?ALKPHOS 74  --   --   --   --   ?BILITOT 0.4  --   --   --   --   ?GFRNONAA >60   < > >60 >60 >60  ?ANIONGAP 6   < >  10 9 8   ? < > = values in this interval not displayed.  ?  ?Lipids No results for input(s): CHOL, TRIG, HDL, LABVLDL, LDLCALC, CHOLHDL in the last 168 hours.  ?Hematology ?Recent Labs  ?Lab 05/27/21 ?0330 05/28/21 ?0257 05/29/21 ?0236  ?WBC 10.2 11.8* 11.1*  ?RBC 4.30 4.03 4.03  ?HGB 12.2 11.5* 11.4*  ?HCT 38.1 34.8* 35.0*  ?MCV 88.6 86.4 86.8  ?MCH 28.4 28.5 28.3  ?MCHC 32.0 33.0 32.6  ?RDW 12.9 12.9 13.0  ?PLT 206 215 214  ? ?Thyroid  ?Recent Labs  ?Lab 05/25/21 ?1532  ?TSH 0.862  ?  ?BNP ?Recent Labs  ?Lab 05/25/21 ?1720  ?BNP 511.1*  ?  ?DDimer No results for input(s): DDIMER in the last 168 hours.  ? ?Radiology  ?  ?ECHO TEE ? ?Result Date: 05/28/2021 ?   TRANSESOPHOGEAL ECHO REPORT   Patient Name:   Abigail Butler Date of Exam: 05/27/2021 Medical Rec #:  [id number]    Height:       65.0 in Accession #:    [id number]   Weight:       182.0 lb Date of Birth:  Jan 08, 1966     BSA:          1.900 m? Patient Age:    56 years     BP:           122/71 mmHg Patient Gender: F            HR:           167 bpm. Exam Location:  Inpatient Procedure: Limited Echo, 3D Echo, Cardiac Doppler and Color Doppler Indications:     Atrial flutter I48.92  History:         Patient has prior history of Echocardiogram examinations, most                  recent 05/26/2021. Stroke; Risk Factors:Hypertension. GERD. Past                  history of Tuberculosis.  Sonographer:     Darlina Sicilian RDCS Referring Phys:  [id number] Nadean Corwin HILTY  Diagnosing Phys: Oswaldo Milian MD PROCEDURE: After discussion of the risks and benefits of a TEE, an informed consent was obtained from the patient. The transesophogeal probe was passed without difficulty through the esophogus of the patient. Imaged were obtained with the patient in a left lateral decubitus position. Sedation performed by different physician. The patient was monitored while under deep sedation. Anesthestetic sedation was provided intravenously by Anesthesiology: 207.59mg  of Propofol, 100mg  of Lidocaine. Image quality was good. The patient developed no complications during the procedure. IMPRESSIONS  1. Left ventricular ejection fraction, by estimation, is 50 to 55%. The left ventricle has low normal function.  2. Right ventricular systolic function is normal. The right ventricular size is normal.  3. Left atrial size was severely dilated. A left atrial/left atrial appendage thrombus was detected.  4. Right atrial size was mildly dilated.  5. The aortic valve is tricuspid. Aortic valve regurgitation is mild. No aortic stenosis is present.  6. Tricuspid valve regurgitation is mild to moderate.  7. A small pericardial effusion is present.  8. The mitral valve is rheumatic. Mild mitral valve regurgitation. Severe mitral stenosis. MG 91mmHg, MVA 1.5 cm^2 by PHT, MVA 1.1 cm^2 by planimetry (measurement was made on 3DQ but images were not transferring to [employer]) FINDINGS  Left Ventricle: Left ventricular ejection fraction, by estimation, is 50 to 55%. The left  ventricle has low normal function. The left ventricular internal cavity size was normal in size. Right Ventricle: The right ventricular size is normal. No increase in right ventricular wall thickness. Right ventricular systolic function is normal. Left Atrium: Left atrial size was severely dilated. A left atrial/left atrial appendage thrombus was detected. Right Atrium: Right atrial size was mildly dilated. Prominent Eustachian valve.  Pericardium: A small pericardial effusion is present. Mitral Valve: The mitral valve is rheumatic. Mild mitral valve regurgitation. Severe mitral valve stenosis. MV peak gradient, 17.6 mmHg. The mean mitral valve gradient is 9.5 mmHg. Tricuspid Valve: The tricuspid valve is normal in structure. Tricuspid valve regurgitation is mild to moderate. Aortic Valve: The aortic valve is tricuspid. Aortic valve regurgitation is mild. No aortic stenosis is present. Pulmonic Valve: The pulmonic valve was grossly normal. Pulmonic valve regurgitation is trivial. Aorta: The aortic root is normal in size and structure. IAS/Shunts: No atrial level shunt detected by color flow Doppler.  LEFT VENTRICLE PLAX 2D LVOT diam:     1.80 cm LVOT Area:     2.54 cm?  MITRAL VALVE               TRICUSPID VALVE MV Area (PHT): 1.54 cm?    TR Peak grad:   31.4 mmHg MV Peak grad:  17.6 mmHg   TR Vmax:        280.00 cm/s MV Mean grad:  9.5 mmHg MV Vmax:       2.10 m/s    SHUNTS MV Vmean:      146.5 cm/s  Systemic Diam: 1.80 cm Oswaldo Milian MD Electronically signed by Oswaldo Milian MD Signature Date/Time: 05/27/2021/9:57:22 PM    Final (Updated)    ? ?Cardiac Studies  ? ?TEE 05/27/21: ? 1. Left ventricular ejection fraction, by estimation, is 50 to 55%. The  ?left ventricle has low normal function.  ? 2. Right ventricular systolic function is normal. The right ventricular  ?size is normal.  ? 3. Left atrial size was severely dilated. A left atrial/left atrial  ?appendage thrombus was detected.  ? 4. Right atrial size was mildly dilated.  ? 5. The aortic valve is tricuspid. Aortic valve regurgitation is mild. No  ?aortic stenosis is present.  ? 6. Tricuspid valve regurgitation is mild to moderate.  ? 7. A small pericardial effusion is present.  ? 8. The mitral valve is rheumatic. Mild mitral valve regurgitation. Severe  ?mitral stenosis. MG 76mmHg, MVA 1.5 cm^2 by PHT, MVA 1.1 cm^2 by  ?planimetry (measurement was made on 3DQ but images  were not transferring  ?to Syngo)  ? ? ?TTE 05/26/21: ? 1. Severe rheumatic mitral stenosis is present. MG 10.1 mmHG @ 95 bpm.  ?PHT difficult due to Afib, but PHT ~140 msec which equates to MVA of 1.56  ?cm2. L
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
## 2021-05-30 ENCOUNTER — Encounter: Payer: Self-pay | Admitting: Internal Medicine

## 2021-05-30 DIAGNOSIS — M0549 Rheumatoid myopathy with rheumatoid arthritis of multiple sites: Secondary | ICD-10-CM

## 2021-05-30 DIAGNOSIS — R319 Hematuria, unspecified: Secondary | ICD-10-CM | POA: Insufficient documentation

## 2021-05-30 DIAGNOSIS — I236 Thrombosis of atrium, auricular appendage, and ventricle as current complications following acute myocardial infarction: Secondary | ICD-10-CM

## 2021-05-30 HISTORY — DX: Hematuria, unspecified: R31.9

## 2021-05-30 LAB — CBC
HCT: 36.8 % (ref 36.0–46.0)
Hemoglobin: 11.9 g/dL — ABNORMAL LOW (ref 12.0–15.0)
MCH: 28.4 pg (ref 26.0–34.0)
MCHC: 32.3 g/dL (ref 30.0–36.0)
MCV: 87.8 fL (ref 80.0–100.0)
Platelets: 201 10*3/uL (ref 150–400)
RBC: 4.19 MIL/uL (ref 3.87–5.11)
RDW: 13 % (ref 11.5–15.5)
WBC: 7.1 10*3/uL (ref 4.0–10.5)
nRBC: 0.4 % — ABNORMAL HIGH (ref 0.0–0.2)

## 2021-05-30 LAB — BASIC METABOLIC PANEL
Anion gap: 7 (ref 5–15)
BUN: 7 mg/dL (ref 6–20)
CO2: 21 mmol/L — ABNORMAL LOW (ref 22–32)
Calcium: 8.5 mg/dL — ABNORMAL LOW (ref 8.9–10.3)
Chloride: 110 mmol/L (ref 98–111)
Creatinine, Ser: 0.77 mg/dL (ref 0.44–1.00)
GFR, Estimated: >60 mL/min (ref >60)
Glucose, Bld: 204 mg/dL — ABNORMAL HIGH (ref 70–99)
Potassium: 3.5 mmol/L (ref 3.5–5.1)
Sodium: 138 mmol/L (ref 135–145)

## 2021-05-30 LAB — PROTIME-INR
INR: 1.1 (ref 0.8–1.2)
Prothrombin Time: 14.6 seconds (ref 11.4–15.2)

## 2021-05-30 LAB — MAGNESIUM: Magnesium: 1.8 mg/dL (ref 1.7–2.4)

## 2021-05-30 MED ORDER — DILTIAZEM HCL 60 MG PO TABS
60.0000 mg | ORAL_TABLET | Freq: Four times a day (QID) | ORAL | Status: AC
  Administered 2021-05-30 – 2021-05-31 (×2): 60 mg via ORAL
  Filled 2021-05-30 (×2): qty 1

## 2021-05-30 MED ORDER — DILTIAZEM HCL ER COATED BEADS 240 MG PO CP24: 240.0000 mg | ORAL_CAPSULE | Freq: Every day | ORAL | Status: DC

## 2021-05-30 MED ORDER — DILTIAZEM HCL ER COATED BEADS 240 MG PO CP24
240.0000 mg | ORAL_CAPSULE | Freq: Every day | ORAL | Status: DC
  Administered 2021-05-31 – 2021-06-02 (×3): 240 mg via ORAL
  Filled 2021-05-30 (×3): qty 1

## 2021-05-30 MED ORDER — DILTIAZEM HCL 60 MG PO TABS
60.0000 mg | ORAL_TABLET | Freq: Four times a day (QID) | ORAL | Status: DC
  Administered 2021-05-30: 60 mg via ORAL
  Filled 2021-05-30: qty 1

## 2021-05-30 MED ORDER — WARFARIN SODIUM 7.5 MG PO TABS
7.5000 mg | ORAL_TABLET | Freq: Once | ORAL | Status: AC
  Administered 2021-05-30: 7.5 mg via ORAL
  Filled 2021-05-30: qty 1

## 2021-05-30 NOTE — Progress Notes (Signed)
? ?Progress Note ? ?Patient Name: Abigail Butler ?Date of Encounter: 05/30/2021 ? ?[facility] Cardiologist: Jenean Lindau, MD  ? ?Subjective  ? ?Breathing OK  No CP   Dizzy intermittently   Sittting or standinig   ? ?Inpatient Medications  ?  ?Scheduled Meds: ? digoxin  0.25 mg Oral Daily  ? diltiazem  60 mg Oral Q6H  ? enoxaparin (LOVENOX) injection  80 mg Subcutaneous Q12H  ? metoprolol tartrate  25 mg Oral BID  ? pneumococcal 20-valent conjugate vaccine  0.5 mL Intramuscular Tomorrow-1000  ? senna-docusate  2 tablet Oral QHS  ? Warfarin - Pharmacist Dosing Inpatient   Does not apply [id number]  ? ?Continuous Infusions: ? sodium chloride Stopped (05/29/21 1824)  ? diltiazem (CARDIZEM) infusion Stopped (05/29/21 1500)  ? ?PRN Meds: ?acetaminophen, alum & mag hydroxide-simeth, metoprolol tartrate, ondansetron (ZOFRAN) IV  ? ?Vital Signs  ?  ?Vitals:  ? 05/30/21 0001 05/30/21 0206 05/30/21 0400 05/30/21 0714  ?BP: (!) 124/59 115/70 106/85 104/76  ?Pulse: 78  80   ?Resp: (!) 26  16   ?Temp: 98.3 ?F (36.8 ?C)  98.1 ?F (36.7 ?C)   ?TempSrc: Oral  Oral   ?SpO2:      ?Weight:      ?Height:      ? ? ?Intake/Output Summary (Last 24 hours) at 05/30/2021 0740 ?Last data filed at 05/29/2021 1824 ?Gross per 24 hour  ?Intake 1484.39 ml  ?Output --  ?Net 1484.39 ml  ? ? ?  05/27/2021  ? 12:00 PM 12/08/2017  ?  2:08 PM 05/18/2017  ? 10:07 AM  ?Last 3 Weights  ?Weight (lbs) 182 lb 183 lb 9.6 oz 191 lb  ?Weight (kg) 82.555 kg 83.28 kg 86.637 kg  ?   ? ?Telemetry  ?  ?Afib  40s to 80s   Personally Reviewed ? ?ECG  ?  ?No new tracings - Personally Reviewed ? ?Physical Exam  ? ?GEN: No acute distress.   ?Neck: No JVD ?Cardiac: Irreg irreg  No S3     ?Respiratory Rales at R base   ?GI: Soft, nontender, non-distended  ?MS: No edema; No deformity. ?Neuro:  Nonfocal  ?Psych: Normal affect  ? ?Labs  ?  ?High Sensitivity Troponin:   ?Recent Labs  ?Lab 05/25/21 ?1532 05/25/21 ?1714  ?TROPONINIHS 12 13  ?   ?Chemistry ?Recent Labs  ?Lab  05/25/21 ?1532 05/25/21 ?2026 05/28/21 ?0257 05/29/21 ?0236 05/30/21 ?[id number]  ?NA 139   < > 141 140 138  ?K 3.7   < > 3.7 3.6 3.5  ?CL 110   < > 112* 110 110  ?CO2 23   < > 20* 22 21*  ?GLUCOSE 122*   < > 122* 110* 204*  ?BUN 9   < > 6 8 7   ?CREATININE 0.75   < > 0.70 0.70 0.77  ?CALCIUM 9.1   < > 8.6* 8.6* 8.5*  ?MG  --    < > 1.8 1.9 1.8  ?PROT 7.5  --   --   --   --   ?ALBUMIN 3.7  --   --   --   --   ?AST 36  --   --   --   --   ?ALT 47*  --   --   --   --   ?ALKPHOS 74  --   --   --   --   ?BILITOT 0.4  --   --   --   --   ?  GFRNONAA >60   < > >60 >60 >60  ?ANIONGAP 6   < > 9 8 7   ? < > = values in this interval not displayed.  ?  ?Lipids No results for input(s): CHOL, TRIG, HDL, LABVLDL, LDLCALC, CHOLHDL in the last 168 hours.  ?Hematology ?Recent Labs  ?Lab 05/28/21 ?0257 05/29/21 ?0236 05/30/21 ?[id number]  ?WBC 11.8* 11.1* 7.1  ?RBC 4.03 4.03 4.19  ?HGB 11.5* 11.4* 11.9*  ?HCT 34.8* 35.0* 36.8  ?MCV 86.4 86.8 87.8  ?MCH 28.5 28.3 28.4  ?MCHC 33.0 32.6 32.3  ?RDW 12.9 13.0 13.0  ?PLT 215 214 201  ? ?Thyroid  ?Recent Labs  ?Lab 05/25/21 ?1532  ?TSH 0.862  ?  ?BNP ?Recent Labs  ?Lab 05/25/21 ?1720  ?BNP 511.1*  ?  ?DDimer No results for input(s): DDIMER in the last 168 hours.  ? ?Radiology  ?  ?No results found. ? ?Cardiac Studies  ? ?TEE 05/27/21: ? 1. Left ventricular ejection fraction, by estimation, is 50 to 55%. The  ?left ventricle has low normal function.  ? 2. Right ventricular systolic function is normal. The right ventricular  ?size is normal.  ? 3. Left atrial size was severely dilated. A left atrial/left atrial  ?appendage thrombus was detected.  ? 4. Right atrial size was mildly dilated.  ? 5. The aortic valve is tricuspid. Aortic valve regurgitation is mild. No  ?aortic stenosis is present.  ? 6. Tricuspid valve regurgitation is mild to moderate.  ? 7. A small pericardial effusion is present.  ? 8. The mitral valve is rheumatic. Mild mitral valve regurgitation. Severe  ?mitral stenosis. MG 52mmHg, MVA 1.5  cm^2 by PHT, MVA 1.1 cm^2 by  ?planimetry (measurement was made on 3DQ but images were not transferring  ?to Syngo)  ? ? ?TTE 05/26/21: ? 1. Severe rheumatic mitral stenosis is present. MG 10.1 mmHG @ 95 bpm.  ?PHT difficult due to Afib, but PHT ~140 msec which equates to MVA of 1.56  ?cm2. Leaflets are mildly thickened with calcification of the PMVL tip.  ?Anterior leaflet is mobile but the  ?PMVL is restricted. There is mild MR. Would recommend TEE for better  ?characterization including MVA by direct 3D assessment. The mitral valve  ?is rheumatic. Mild mitral valve regurgitation. Severe mitral stenosis. The  ?mean mitral valve gradient is 10.1  ?mmHg with average heart rate of 95 bpm.  ? 2. Left ventricular ejection fraction, by estimation, is 55 to 60%. The  ?left ventricle has normal function. The left ventricle has no regional  ?wall motion abnormalities. Left ventricular diastolic function could not  ?be evaluated.  ? 3. Right ventricular systolic function is normal. The right ventricular  ?size is normal. There is mildly elevated pulmonary artery systolic  ?pressure. The estimated right ventricular systolic pressure is 99991111 mmHg.  ? 4. Left atrial size was severely dilated.  ? 5. Right atrial size was mildly dilated.  ? 6. The aortic valve is tricuspid. Aortic valve regurgitation is mild. No  ?aortic stenosis is present.  ? 7. The inferior vena cava is normal in size with <50% respiratory  ?variability, suggesting right atrial pressure of 8 mmHg.  ? ?Patient Profile  ?   ?56 y.o. female with a hx of HTN (not taking antihypertensive), prior latent TB, GERD and CVA who is being seen 05/26/2021 for the evaluation of AFib/Flutter at the request of Dr. [place]. ?  ?Cardiac catheterization in 2019 for chest pain showed normal coronary.  Last seen  by Dr. Geraldo Pitter March 2019. ? ?Assessment & Plan  ?  ?Atrial fibrillation with RVR ?-Rates finally contrlled on current regimen    ?Will pull back a little no meds as HR 40  at night ?Anticoagulation in progress   ? ?Large LAA thrombus ?-  Pt transitioned to lovenox and coumadin is loading  ?- prior ischemic CVA ?- Hb 11.5 ? Will need close f/u   Stressed with faimily   ? ? ?Rheumatic mitral valve disease ?Severe MS ?- TEE as above ?- MG 13mmHg, MVA 1.5 cm^2 by PHT, MVA 1.1 cm^2 by  ?planimetry  ?Records sent to CV surgery to review     ? ?Chest tightness  ?Symptoms improved   Follow  ?Some rales at right base    Follow  ? ?Dizzinss   Check orthoststaics     Ambulate  ? ? ? ?   ? ?For questions or updates, please contact [place] ?Please consult [email or web address] for contact info under  ? ?  ?   ?Signed, ?Dorris Carnes, MD  ?05/30/2021, 7:40 AM   ? ?

## 2021-05-30 NOTE — Progress Notes (Signed)
? ?  HD#4 ?SUBJECTIVE:  ?Patient Summary: Abigail Butler is a 56 y.o. with a pertinent PMH of hypertension, GERD, previous ischemic CVA, and previous history of latent TB s/p treatment, who presented with chest tightness and admitted for new onset atrial flutter with variable AV block ? ?Overnight Events: No acute events overnight ? ?Interim History: This is hospital day 4 for this patient who was seen and evaluated with her daughter at the bedside. She states that she no longer has any chest pain, nausea, or headache. Her throat pain is improved today, as well.  ? ?OBJECTIVE:  ?Vital Signs: ?Vitals:  ? 05/29/21 2152 05/30/21 0001 05/30/21 0206 05/30/21 0400  ?BP: 119/63 (!) 124/59 115/70 106/85  ?Pulse: 89 78  80  ?Resp:  (!) 26  16  ?Temp:  98.3 ?F (36.8 ?C)  98.1 ?F (36.7 ?C)  ?TempSrc:  Oral  Oral  ?SpO2:      ?Weight:      ?Height:      ? ?Supplemental O2: Room Air ?SpO2: 97 % ? ?Filed Weights  ? 05/27/21 1200  ?Weight: 82.6 kg  ? ? ? ?Intake/Output Summary (Last 24 hours) at 05/30/2021 0655 ?Last data filed at 05/29/2021 1824 ?Gross per 24 hour  ?Intake 1484.39 ml  ?Output 300 ml  ?Net 1184.39 ml  ? ?Net IO Since Admission: 3,130.13 mL [05/30/21 0655] ? ?Physical Exam: ?Physical Exam ?Vitals reviewed.  ?Constitutional:   ?   General: She is not in acute distress. ?   Appearance: Normal appearance. She is not ill-appearing, toxic-appearing or diaphoretic.  ?   Comments: Resting comfortably in bed, answers questions appropriately, no acute distress.  ?Cardiovascular:  ?   Heart sounds: Normal heart sounds.  ?   Comments: Normal rate, irregular irregular rhythm.  No murmurs/rubs/gallops. ?Pulmonary:  ?   Effort: Pulmonary effort is normal. No respiratory distress.  ?   Breath sounds: Normal breath sounds. No stridor. No wheezing, rhonchi or rales.  ?   Comments: CTAB ?Psychiatric:     ?   Mood and Affect: Mood normal.     ?   Behavior: Behavior normal.  ?  ? ? ? ?ASSESSMENT/PLAN:  ?Assessment: ?Principal Problem: ?   Atrial flutter (HCC) ?Active Problems: ?  Atrial fibrillation with RVR (HCC) ?  Mitral stenosis with insufficiency, rheumatic ? ? ?Plan: ?#New onset atrial fibrillation with RVR ?#Severe Rheumatic MS ?#LAA thrombus ?Patient had TEE with cardiology on 3/29 and was unable to be cardioverted as she was found to have a large LAA thrombus and severe mitral stenosis. CV surgery to be contacted regarding MVR. Patient feels improved compared to yesterday. No chest pain or shortness of breath. Cardiology signed out ?- Appreciate cardiology recs ?- Continue metoprolol tartrate 25 mg bid ?- Continue digoxin 0.25 mg daily ?- Continue diltiazem 60 mg q6h  ?- Patient was transitioned to Lovenox from heparin and bridging to Coumadin, appreciate our pharmacist's assistance. ? ?#Hypertension ?New to be stable with systolics in the 110s to 120s. ?- Continue diltiazem 60 mg every 6 hours ?- Metoprolol tartrate 25 mg twice daily ? ? ?Best Practice: ?Diet: Cardiac diet ?IVF: Fluids: none ?VTE: Heparin ?Code: Full ?AB: none ?Therapy Recs: Home Health, DME: none ?Family Contact: Daughter, at bedside. ?DISPO: Anticipated discharge in 1-3 days to Home pending Medical stability. ? ?Signature: ?Dolan Amen, MD ?IMTS, PGY-3 ?05/30/2021,6:57 AM  ?Please contact the on call pager after 5 pm and on weekends at [phone]. ? ?

## 2021-05-30 NOTE — Progress Notes (Signed)
Nurse reached out to inquire about timing of diltiazem dosing. Dr. Tenny Craw ordered to continue diltiazem 60mg  q6hr then transition to diltiazem 240mg  in AM. Upon ordering, the short acting order only queued up in [facility] for 1 additional dose at 1800, with next dose of diltiazem not scheduled until long acting dose tomorrow at 10am. There was notation in today's note about pulling back on dose but was ordered with the dosing regimen above. HR trends presently around 100-115 BPM with trends 70s-120s. Nocturnal rates appeared to be in the 60s therefore seems reasonable to continue this dose plan except make sure patient gets a 0000 short acting dose to avoid going too long without diltiazem on board. Will shift long-acting diltiazem dose tomorrow AM from 10am to 8am. Cardiology team will review HR trends in AM. ?Chayah Mckee PA-C ? ?

## 2021-05-30 NOTE — Progress Notes (Addendum)
ANTICOAGULATION CONSULT NOTE - Follow Up Consult ? ?Pharmacy Consult for Warfarin ?Indication: atrial fibrillation ? ?Allergies  ?Allergen Reactions  ? Pork-Derived Products Other (See Comments)  ?  Religous belief  ? ? ?Patient Measurements: ?Height: 5\' 5"  (165.1 cm) ?Weight: 82.6 kg (182 lb) ?IBW/kg (Calculated) : 57 ? ?Vital Signs: ?Temp: 98.3 ?F (36.8 ?C) (04/01 [id number]) ?Temp Source: Oral (04/01 [id number]) ?BP: 109/81 (04/01 0946) ?Pulse Rate: 94 (04/01 0807) ? ?Labs: ?Recent Labs  ?  05/28/21 ?0257 05/28/21 ?1444 05/29/21 ?0236 05/30/21 ?[id number]  ?HGB 11.5*  --  11.4* 11.9*  ?HCT 34.8*  --  35.0* 36.8  ?PLT 215  --  214 201  ?LABPROT  --   --   --  14.6  ?INR  --   --   --  1.1  ?HEPARINUNFRC 0.26* 0.41 0.40  --   ?CREATININE 0.70  --  0.70 0.77  ? ? ?Estimated Creatinine Clearance: 82.3 mL/min (by C-G formula based on SCr of 0.77 mg/dL). ? ? ?Medications:  ?Scheduled:  ? digoxin  0.25 mg Oral Daily  ? diltiazem  240 mg Oral Daily  ? enoxaparin (LOVENOX) injection  80 mg Subcutaneous Q12H  ? pneumococcal 20-valent conjugate vaccine  0.5 mL Intramuscular Tomorrow-1000  ? senna-docusate  2 tablet Oral QHS  ? Warfarin - Pharmacist Dosing Inpatient   Does not apply [id number]  ? ? ?Assessment: ?56 yo F presenting with malaise, tachycardia, in atrial fibrillation with RVR. No anticoagulation PTA.  ? ?Per cardiology, stopped heparin on 3/31 and will bridge with enoxaparin instead. Pharmacy consulted for warfarin dosing.  ? ?INR today is 1.1, subtherapeutic. Hgb 11.9, plt 201. No signs/symptoms of bleeding noted per RN.  ? ? ?Goal of Therapy:  ?INR 2-3 ?Monitor platelets by anticoagulation protocol: Yes ?  ?Plan:  ?Enoxaparin 80 mg SUBQ q12h. ?Warfarin 7.5 mg x 1 today.  ?Daily INR, CBC. ?Monitor for signs/symptoms of bleeding.  ? ? ?Vance Peper, PharmD ?PGY1 Pharmacy Resident ?Phone [phone] ?05/30/2021 9:50 AM  ? ?Please check AMION for all [place] phone numbers ?After 10:00 PM, call [place] [phone] ? ? ? ?

## 2021-05-31 LAB — BASIC METABOLIC PANEL
Anion gap: 12 (ref 5–15)
BUN: 7 mg/dL (ref 6–20)
CO2: 21 mmol/L — ABNORMAL LOW (ref 22–32)
Calcium: 8.7 mg/dL — ABNORMAL LOW (ref 8.9–10.3)
Chloride: 109 mmol/L (ref 98–111)
Creatinine, Ser: 0.67 mg/dL (ref 0.44–1.00)
GFR, Estimated: >60 mL/min (ref >60)
Glucose, Bld: 178 mg/dL — ABNORMAL HIGH (ref 70–99)
Potassium: 3.4 mmol/L — ABNORMAL LOW (ref 3.5–5.1)
Sodium: 142 mmol/L (ref 135–145)

## 2021-05-31 LAB — CBC
HCT: 35.6 % — ABNORMAL LOW (ref 36.0–46.0)
Hemoglobin: 11.5 g/dL — ABNORMAL LOW (ref 12.0–15.0)
MCH: 28.3 pg (ref 26.0–34.0)
MCHC: 32.3 g/dL (ref 30.0–36.0)
MCV: 87.5 fL (ref 80.0–100.0)
Platelets: 209 10*3/uL (ref 150–400)
RBC: 4.07 MIL/uL (ref 3.87–5.11)
RDW: 13.2 % (ref 11.5–15.5)
WBC: 6.9 10*3/uL (ref 4.0–10.5)
nRBC: 0.3 % — ABNORMAL HIGH (ref 0.0–0.2)

## 2021-05-31 LAB — PROTIME-INR
INR: 1.2 (ref 0.8–1.2)
Prothrombin Time: 14.9 seconds (ref 11.4–15.2)

## 2021-05-31 LAB — MAGNESIUM: Magnesium: 1.7 mg/dL (ref 1.7–2.4)

## 2021-05-31 MED ORDER — MAGNESIUM SULFATE 2 GM/50ML IV SOLN
2.0000 g | Freq: Once | INTRAVENOUS | Status: AC
  Administered 2021-05-31: 2 g via INTRAVENOUS
  Filled 2021-05-31: qty 50

## 2021-05-31 MED ORDER — POTASSIUM CHLORIDE 20 MEQ PO PACK
40.0000 meq | PACK | Freq: Two times a day (BID) | ORAL | Status: AC
  Administered 2021-05-31 (×2): 40 meq via ORAL
  Filled 2021-05-31 (×3): qty 2

## 2021-05-31 MED ORDER — WARFARIN SODIUM 5 MG PO TABS
10.0000 mg | ORAL_TABLET | Freq: Once | ORAL | Status: AC
  Administered 2021-05-31: 10 mg via ORAL
  Filled 2021-05-31: qty 2

## 2021-05-31 MED ORDER — METOPROLOL TARTRATE 25 MG PO TABS
25.0000 mg | ORAL_TABLET | Freq: Four times a day (QID) | ORAL | Status: DC
  Administered 2021-05-31 – 2021-06-01 (×4): 25 mg via ORAL
  Filled 2021-05-31 (×4): qty 1

## 2021-05-31 NOTE — Progress Notes (Signed)
Pt's HR in the 160s-170s when getting up to the sink/bathroom.  ?

## 2021-05-31 NOTE — Progress Notes (Signed)
? ?HD#5 ?SUBJECTIVE:  ?Patient Summary: Abigail Butler is a 56 y.o. with a pertinent PMH of hypertension, GERD, previous ischemic CVA, and previous history of latent TB s/p treatment, who presented with chest tightness and admitted for new onset atrial flutter with variable AV block. ? ?Overnight Events: none ? ?Interim History: Patient assessed at bedside this AM.  Her daughter is at bedside and helps with translation as noted translator available with dialect.  She reports chest heaviness overnight.  She denies shortness of breath, nausea, or dizziness ? ?OBJECTIVE:  ?Vital Signs: ?Vitals:  ? 05/30/21 2002 05/31/21 0014 05/31/21 0029 05/31/21 0998  ?BP: 111/61 (!) 159/148 114/72 (!) 113/53  ?Pulse: 61 (!) 48 90 94  ?Resp: 14 18 19 19   ?Temp: 97.9 ?F (36.6 ?C) 98.1 ?F (36.7 ?C) 98.1 ?F (36.7 ?C) 98 ?F (36.7 ?C)  ?TempSrc: Oral Oral  Oral  ?SpO2:   100%   ?Weight:      ?Height:      ? ?Supplemental O2: Room Air ?SpO2: 100 % ? ?Filed Weights  ? 05/27/21 1200  ?Weight: 82.6 kg  ? ? ?No intake or output data in the 24 hours ending 05/31/21 0720 ?Net IO Since Admission: 3,130.13 mL [05/31/21 0720] ? ?Physical Exam: ?Constitutional: Laying in bed With daughter at bedside, no acute distress ?HENT: normocephalic atraumatic, mucous membranes moist ?Eyes: conjunctiva non-erythematous ?Neck: supple, no JVD ?Cardiovascular: Tachycardic with irregularly irregular rhythm ?Pulmonary/Chest: normal work of breathing on room air, lungs clear to auscultation bilaterally ?Abdominal: soft, non-tender, non-distended ?MSK: normal bulk and tone ?Neurological: alert & oriented x 3 ?Skin: warm and dry ?Psych: normal mood and affect ? ?Patient Lines/Drains/Airways Status   ? ? Active Line/Drains/Airways   ? ? Name Placement date Placement time Site Days  ? Peripheral IV 05/25/21 22 G 1" Left Hand 05/25/21  1942  Hand  6  ? Peripheral IV 05/27/21 20 G Posterior;Right Hand 05/27/21  2030  Hand  4  ? ?  ?  ? ?  ? ? ?Pertinent Labs: ? ?  Latest  Ref Rng & Units 05/31/2021  ?  3:18 AM 05/30/2021  ?  3:17 AM 05/29/2021  ?  2:36 AM  ?CBC  ?WBC 4.0 - 10.5 K/uL 6.9   7.1   11.1    ?Hemoglobin 12.0 - 15.0 g/dL 05/31/2021   33.8   25.0    ?Hematocrit 36.0 - 46.0 % 35.6   36.8   35.0    ?Platelets 150 - 400 K/uL 209   201   214    ? ? ? ?  Latest Ref Rng & Units 05/31/2021  ?  3:18 AM 05/30/2021  ?  3:17 AM 05/29/2021  ?  2:36 AM  ?CMP  ?Glucose 70 - 99 mg/dL 05/31/2021   767   341    ?BUN 6 - 20 mg/dL 7   7   8     ?Creatinine 0.44 - 1.00 mg/dL 937     9.02    ?Sodium 135 - 145 mmol/L 142   138   140    ?Potassium 3.5 - 5.1 mmol/L 3.4   3.5   3.6    ?Chloride 98 - 111 mmol/L 109   110   110    ?CO2 22 - 32 mmol/L 21   21   22     ?Calcium 8.9 - 10.3 mg/dL 8.7   8.5   8.6    ? ? ?No results for input(s): GLUCAP in the last  72 hours.  ? ?Pertinent Imaging: ?No results found. ? ?ASSESSMENT/PLAN:  ?Assessment: ?Principal Problem: ?  Atrial flutter (HCC) ?Active Problems: ?  Atrial fibrillation with RVR (HCC) ?  Mitral stenosis with insufficiency, rheumatic ? ? ?Abigail Butler is a 56 y.o. with a pertinent PMH of hypertension, GERD, previous ischemic CVA, and previous history of latent TB s/p treatment, who presented with chest tightness and admitted for new onset atrial flutter with variable AV block. TEE showed severe mitral stenosis and thrombus of left atrium. ? ?Plan: ?#New onset atrial fibrillation with RVR ?#Severe Rheumatic MS ?#LAA thrombus ?Heart rates elevated into 120s overnight and this AM. Patient reports chest heaviness. Spoke with Dr. Tenny Craw with cardiology and metoprolol will be restarted and long-acting diltiazem continued. ?Patient information was sent to cardiothoracic team by cardiology on 3/31. Her case is not emergent so once stable regimen determined to control heart rate, she will be ok for discharge. She was started on warfarin as she will need mitral valve replacement eventually and is being bridged with lovenox. INR goal of 2-3. She will need close follow-up with  [facility] for monitoring INR. ? ?- Appreciate cardiology recs ?- Restart metoprolol tartrate ?- Continue digoxin 0.25 mg daily ?- Short-acting diltiazem transitioned to long acting. Diltiazem 240 mg qd ?- Patient was transitioned to Lovenox from heparin and bridging to Warfarin, appreciate our pharmacist's assistance. ?  ?#Hypertension ?New to be stable with systolics in the 110s to 120s. ? ?- Short-acting diltiazem transitioned to long acting. Diltiazem 240 mg qd ?- Restart metoprolol tartrate ? ?Best Practice: ?Diet: Cardiac diet ?IVF: Fluids: none ?VTE: Warfarin, bridging with enoxaparin ?Code: Full ?AB: none ?Therapy Recs: Home Health, DME: none ?Family Contact: Daughter, at bedside. ?DISPO: Anticipated discharge in 1-3 days to Home pending Medical stability. ? ?Signature: ?Abigail Butler. Abigail Butler, D.O.  ?Internal Medicine Resident, PGY-1 ?[facility]  ?Pager: [phone] ?7:20 AM, 05/31/2021  ? ?Please contact the on call pager after 5 pm and on weekends at [phone].  ?

## 2021-05-31 NOTE — Progress Notes (Signed)
? ?Progress Note ? ?Patient Name: Abigail Butler ?Date of Encounter: 05/31/2021 ? ?[facility] Cardiologist: Jenean Lindau, MD  ? ?Subjective  ? ?Deies dizzness   No SOB   No CP    ? ?Inpatient Medications  ?  ?Scheduled Meds: ? digoxin  0.25 mg Oral Daily  ? diltiazem  240 mg Oral Daily  ? enoxaparin (LOVENOX) injection  80 mg Subcutaneous Q12H  ? pneumococcal 20-valent conjugate vaccine  0.5 mL Intramuscular Tomorrow-1000  ? potassium chloride  40 mEq Oral BID  ? senna-docusate  2 tablet Oral QHS  ? Warfarin - Pharmacist Dosing Inpatient   Does not apply [id number]  ? ?Continuous Infusions: ? sodium chloride Stopped (05/29/21 1824)  ? magnesium sulfate bolus IVPB    ? ?PRN Meds: ?acetaminophen, alum & mag hydroxide-simeth, metoprolol tartrate, ondansetron (ZOFRAN) IV  ? ?Vital Signs  ?  ?Vitals:  ? 05/30/21 2002 05/31/21 [id number] 05/31/21 0029 05/31/21 [id number]  ?BP: 111/61 (!) 159/148 114/72 (!) 113/53  ?Pulse: 61 (!) 48 90 94  ?Resp: 14 18 19 19   ?Temp: 97.9 ?F (36.6 ?C) 98.1 ?F (36.7 ?C) 98.1 ?F (36.7 ?C) 98 ?F (36.7 ?C)  ?TempSrc: Oral Oral  Oral  ?SpO2:   100%   ?Weight:      ?Height:      ? ?No intake or output data in the 24 hours ending 05/31/21 0740 ? ? ?  05/27/2021  ? 12:00 PM 12/08/2017  ?  2:08 PM 05/18/2017  ? 10:07 AM  ?Last 3 Weights  ?Weight (lbs) 182 lb 183 lb 9.6 oz 191 lb  ?Weight (kg) 82.555 kg 83.28 kg 86.637 kg  ?   ? ?Telemetry  ?  ?Afib 70 to 140     Personally Reviewed ? ?ECG  ?  ?No new tracings - Personally Reviewed ? ?Physical Exam  ? ?GEN: No acute distress.   ?Neck: No JVD ?Cardiac: Irreg irreg  No S3     ?Respiratory Rales at R base   ?GI: Soft, nontender, non-distended  ?MS: No edema; No deformity. ?Neuro:  Nonfocal  ?Psych: Normal affect  ? ?Labs  ?  ?High Sensitivity Troponin:   ?Recent Labs  ?Lab 05/25/21 ?1532 05/25/21 ?1714  ?TROPONINIHS 12 13  ?   ?Chemistry ?Recent Labs  ?Lab 05/25/21 ?1532 05/25/21 ?2026 05/29/21 ?0236 05/30/21 ?[id number] 05/31/21 ?[id number]  ?NA 139   < > 140 138 142  ?K 3.7    < > 3.6 3.5 3.4*  ?CL 110   < > 110 110 109  ?CO2 23   < > 22 21* 21*  ?GLUCOSE 122*   < > 110* 204* 178*  ?BUN 9   < > 8 7 7   ?CREATININE 0.75   < > 0.70 0.77 0.67  ?CALCIUM 9.1   < > 8.6* 8.5* 8.7*  ?MG  --    < > 1.9 1.8 1.7  ?PROT 7.5  --   --   --   --   ?ALBUMIN 3.7  --   --   --   --   ?AST 36  --   --   --   --   ?ALT 47*  --   --   --   --   ?ALKPHOS 74  --   --   --   --   ?BILITOT 0.4  --   --   --   --   ?GFRNONAA >60   < > >60 >60 >60  ?ANIONGAP 6   < >  8 7 12   ? < > = values in this interval not displayed.  ?  ?Lipids No results for input(s): CHOL, TRIG, HDL, LABVLDL, LDLCALC, CHOLHDL in the last 168 hours.  ?Hematology ?Recent Labs  ?Lab 05/29/21 ?0236 05/30/21 ?[id number] 05/31/21 ?[id number]  ?WBC 11.1* 7.1 6.9  ?RBC 4.03 4.19 4.07  ?HGB 11.4* 11.9* 11.5*  ?HCT 35.0* 36.8 35.6*  ?MCV 86.8 87.8 87.5  ?MCH 28.3 28.4 28.3  ?MCHC 32.6 32.3 32.3  ?RDW 13.0 13.0 13.2  ?PLT 214 201 209  ? ?Thyroid  ?Recent Labs  ?Lab 05/25/21 ?1532  ?TSH 0.862  ?  ?BNP ?Recent Labs  ?Lab 05/25/21 ?1720  ?BNP 511.1*  ?  ?DDimer No results for input(s): DDIMER in the last 168 hours.  ? ?Radiology  ?  ?No results found. ? ?Cardiac Studies  ? ?TEE 05/27/21: ? 1. Left ventricular ejection fraction, by estimation, is 50 to 55%. The  ?left ventricle has low normal function.  ? 2. Right ventricular systolic function is normal. The right ventricular  ?size is normal.  ? 3. Left atrial size was severely dilated. A left atrial/left atrial  ?appendage thrombus was detected.  ? 4. Right atrial size was mildly dilated.  ? 5. The aortic valve is tricuspid. Aortic valve regurgitation is mild. No  ?aortic stenosis is present.  ? 6. Tricuspid valve regurgitation is mild to moderate.  ? 7. A small pericardial effusion is present.  ? 8. The mitral valve is rheumatic. Mild mitral valve regurgitation. Severe  ?mitral stenosis. MG 40mmHg, MVA 1.5 cm^2 by PHT, MVA 1.1 cm^2 by  ?planimetry (measurement was made on 3DQ but images were not transferring  ?to  Syngo)  ? ? ?TTE 05/26/21: ? 1. Severe rheumatic mitral stenosis is present. MG 10.1 mmHG @ 95 bpm.  ?PHT difficult due to Afib, but PHT ~140 msec which equates to MVA of 1.56  ?cm2. Leaflets are mildly thickened with calcification of the PMVL tip.  ?Anterior leaflet is mobile but the  ?PMVL is restricted. There is mild MR. Would recommend TEE for better  ?characterization including MVA by direct 3D assessment. The mitral valve  ?is rheumatic. Mild mitral valve regurgitation. Severe mitral stenosis. The  ?mean mitral valve gradient is 10.1  ?mmHg with average heart rate of 95 bpm.  ? 2. Left ventricular ejection fraction, by estimation, is 55 to 60%. The  ?left ventricle has normal function. The left ventricle has no regional  ?wall motion abnormalities. Left ventricular diastolic function could not  ?be evaluated.  ? 3. Right ventricular systolic function is normal. The right ventricular  ?size is normal. There is mildly elevated pulmonary artery systolic  ?pressure. The estimated right ventricular systolic pressure is [id number] mmHg.  ? 4. Left atrial size was severely dilated.  ? 5. Right atrial size was mildly dilated.  ? 6. The aortic valve is tricuspid. Aortic valve regurgitation is mild. No  ?aortic stenosis is present.  ? 7. The inferior vena cava is normal in size with <50% respiratory  ?variability, suggesting right atrial pressure of 8 mmHg.  ? ?Patient Profile  ?   ?56 y.o. female with a hx of HTN (not taking antihypertensive), prior latent TB, GERD and CVA who is being seen 05/26/2021 for the evaluation of AFib/Flutter at the request of Dr. Heber Waterloo. ?  ?Cardiac catheterization in 2019 for chest pain showed normal coronary.  Last seen by Dr. Geraldo Pitter March 2019. ? ?Assessment & Plan  ?  ?Atrial fibrillation with RVR ?  Rates were better yesterday, actually a little low so I pulled back on meds   Will resume b blocker    ?Keep on dig and dilt     ?Anticoagulation with lovenox bridge  INR 1.2  (If rates good could  go home prior to therapeutic leviels; follow in coumadin clinic  ? ?Large LAA thrombus ?-  Pt transitioned to lovenox and coumadin is loading  ?- prior ischemic CVA ?- Hb 11.5 ? Will need close f/u   Stressed again with patinet and  faimily   ? ? ?Rheumatic mitral valve disease ?Severe MS ?- TEE as above ?- MG 58mmHg, MVA 1.5 cm^2 by PHT, MVA 1.1 cm^2 by  ?planimetry  ?Records sent to CV surgery to review    ([facility])   Will contact this week   ? ?Chest tightness  ?Symptoms improved   Follow  ?Some rales at right base    Follow  ? ?Dizzinss   Denies   ? ? ? ?   ? ?For questions or updates, please contact [place] ?Please consult [email or web address] for contact info under  ? ?  ?   ?Signed, ?Dorris Carnes, MD  ?05/31/2021, 7:40 AM   ? ?

## 2021-05-31 NOTE — Progress Notes (Signed)
ANTICOAGULATION CONSULT NOTE - Follow Up Consult ? ?Pharmacy Consult for Warfarin ?Indication: atrial fibrillation ? ?Allergies  ?Allergen Reactions  ? Pork-Derived Products Other (See Comments)  ?  Religous belief  ? ? ?Patient Measurements: ?Height: 5\' 5"  (165.1 cm) ?Weight: 82.6 kg (182 lb) ?IBW/kg (Calculated) : 57 ? ?Vital Signs: ?Temp: 98 ?F (36.7 ?C) (04/02 [id number]) ?Temp Source: Oral (04/02 [id number]) ?BP: 124/77 (04/02 0801) ?Pulse Rate: 94 (04/02 0641) ? ?Labs: ?Recent Labs  ?  05/28/21 ?1444 05/29/21 ?0236 05/29/21 ?0236 05/30/21 ?[id number] 05/31/21 ?[id number]  ?HGB  --  11.4*   < > 11.9* 11.5*  ?HCT  --  35.0*  --  36.8 35.6*  ?PLT  --  214  --  201 209  ?LABPROT  --   --   --  14.6 14.9  ?INR  --   --   --  1.1 1.2  ?HEPARINUNFRC 0.41 0.40  --   --   --   ?CREATININE  --  0.70  --  0.77 0.67  ? < > = values in this interval not displayed.  ? ? ?Estimated Creatinine Clearance: 82.3 mL/min (by C-G formula based on SCr of 0.67 mg/dL). ? ? ?Medications:  ?Scheduled:  ? digoxin  0.25 mg Oral Daily  ? diltiazem  240 mg Oral Daily  ? enoxaparin (LOVENOX) injection  80 mg Subcutaneous Q12H  ? pneumococcal 20-valent conjugate vaccine  0.5 mL Intramuscular Tomorrow-1000  ? potassium chloride  40 mEq Oral BID  ? senna-docusate  2 tablet Oral QHS  ? Warfarin - Pharmacist Dosing Inpatient   Does not apply [id number]  ? ? ?Assessment: ?56 yo F presenting with malaise, tachycardia, in atrial fibrillation with RVR. No anticoagulation PTA.  ? ?Per cardiology, stopped heparin on 3/31 and will bridge with enoxaparin instead. Pharmacy consulted for warfarin dosing.  ? ?INR today is 1.2, subtherapeutic. Hgb 11.5, plt 209. No signs/symptoms of bleeding noted per RN.  ? ? ?Goal of Therapy:  ?INR 2-3 ?Monitor platelets by anticoagulation protocol: Yes ?  ?Plan:  ?Enoxaparin 80 mg SUBQ q12h. ?Warfarin 10 mg x 1 today.  ?Daily INR, CBC. ?Monitor for signs/symptoms of bleeding.  ? ? ?Vance Peper, PharmD ?PGY1 Pharmacy Resident ?Phone [phone] ?05/31/2021  8:41 AM  ? ?Please check AMION for all [place] phone numbers ?After 10:00 PM, call [place] [phone] ? ? ? ?

## 2021-06-01 LAB — CBC
HCT: 39.6 % (ref 36.0–46.0)
Hemoglobin: 12.4 g/dL (ref 12.0–15.0)
MCH: 27.9 pg (ref 26.0–34.0)
MCHC: 31.3 g/dL (ref 30.0–36.0)
MCV: 89 fL (ref 80.0–100.0)
Platelets: 260 10*3/uL (ref 150–400)
RBC: 4.45 MIL/uL (ref 3.87–5.11)
RDW: 13.2 % (ref 11.5–15.5)
WBC: 9.9 10*3/uL (ref 4.0–10.5)
nRBC: 0 % (ref 0.0–0.2)

## 2021-06-01 LAB — MAGNESIUM: Magnesium: 2.3 mg/dL (ref 1.7–2.4)

## 2021-06-01 LAB — BASIC METABOLIC PANEL
Anion gap: 5 (ref 5–15)
BUN: 7 mg/dL (ref 6–20)
CO2: 23 mmol/L (ref 22–32)
Calcium: 8.7 mg/dL — ABNORMAL LOW (ref 8.9–10.3)
Chloride: 106 mmol/L (ref 98–111)
Creatinine, Ser: 0.7 mg/dL (ref 0.44–1.00)
GFR, Estimated: >60 mL/min (ref >60)
Glucose, Bld: 128 mg/dL — ABNORMAL HIGH (ref 70–99)
Potassium: 5 mmol/L (ref 3.5–5.1)
Sodium: 134 mmol/L — ABNORMAL LOW (ref 135–145)

## 2021-06-01 LAB — PROTIME-INR
INR: 1.5 — ABNORMAL HIGH (ref 0.8–1.2)
Prothrombin Time: 18.5 seconds — ABNORMAL HIGH (ref 11.4–15.2)

## 2021-06-01 MED ORDER — METOPROLOL SUCCINATE ER 50 MG PO TB24
75.0000 mg | ORAL_TABLET | Freq: Every day | ORAL | Status: DC
  Administered 2021-06-02: 75 mg via ORAL
  Filled 2021-06-01: qty 1

## 2021-06-01 MED ORDER — WARFARIN SODIUM 5 MG PO TABS
8.0000 mg | ORAL_TABLET | Freq: Once | ORAL | Status: AC
  Administered 2021-06-01: 8 mg via ORAL
  Filled 2021-06-01: qty 1

## 2021-06-01 MED ORDER — METOPROLOL TARTRATE 25 MG PO TABS
25.0000 mg | ORAL_TABLET | Freq: Three times a day (TID) | ORAL | Status: AC
  Administered 2021-06-01 (×2): 25 mg via ORAL
  Filled 2021-06-01 (×2): qty 1

## 2021-06-01 MED ORDER — FONDAPARINUX SODIUM 7.5 MG/0.6ML ~~LOC~~ SOLN
7.5000 mg | SUBCUTANEOUS | Status: DC
  Administered 2021-06-01 – 2021-06-02 (×2): 7.5 mg via SUBCUTANEOUS
  Filled 2021-06-01 (×2): qty 0.6

## 2021-06-01 MED ORDER — METOPROLOL TARTRATE 25 MG PO TABS: 25.0000 mg | ORAL_TABLET | Freq: Three times a day (TID) | ORAL | Status: DC

## 2021-06-01 NOTE — Progress Notes (Addendum)
? ?Progress Note ? ?Patient Name: Abigail Butler ?Date of Encounter: 06/01/2021 ? ?[facility] Cardiologist: Jenean Lindau, MD  ? ?Subjective  ? ?Patient is sitting in bed, speaks minimal English. Her daughter is present and offers translation. Patient states she is feeling well. She denied any chest pain, SOB, bleeding. She is eating good. She c/o some dizziness last night while laying in bed, felt her bed was "going down", dizziness has resolved today.  ? ?Inpatient Medications  ?  ?Scheduled Meds: ? digoxin  0.25 mg Oral Daily  ? diltiazem  240 mg Oral Daily  ? enoxaparin (LOVENOX) injection  80 mg Subcutaneous Q12H  ? metoprolol tartrate  25 mg Oral TID  ? pneumococcal 20-valent conjugate vaccine  0.5 mL Intramuscular Tomorrow-1000  ? senna-docusate  2 tablet Oral QHS  ? Warfarin - Pharmacist Dosing Inpatient   Does not apply [id number]  ? ?Continuous Infusions: ? sodium chloride Stopped (05/29/21 1824)  ? ?PRN Meds: ?acetaminophen, alum & mag hydroxide-simeth, metoprolol tartrate, ondansetron (ZOFRAN) IV  ? ?Vital Signs  ?  ?Vitals:  ? 05/31/21 2044 05/31/21 2315 06/01/21 0606 06/01/21 0904  ?BP: (!) 122/56 106/72 (!) 111/53 118/87  ?Pulse: 71 70 81 81  ?Resp: 19  16   ?Temp: 98.8 ?F (37.1 ?C)  97.6 ?F (36.4 ?C)   ?TempSrc: Oral  Oral   ?SpO2:   98%   ?Weight:      ?Height:      ? ? ?Intake/Output Summary (Last 24 hours) at 06/01/2021 1000 ?Last data filed at 05/31/2021 2200 ?Gross per 24 hour  ?Intake 360 ml  ?Output --  ?Net 360 ml  ? ? ?  05/27/2021  ? 12:00 PM 12/08/2017  ?  2:08 PM 05/18/2017  ? 10:07 AM  ?Last 3 Weights  ?Weight (lbs) 182 lb 183 lb 9.6 oz 191 lb  ?Weight (kg) 82.555 kg 83.28 kg 86.637 kg  ?   ? ?Telemetry  ?  ?A fib with ventricular rate of 40-80s, pauses up to 2.19s  - Personally Reviewed ? ?ECG  ?  ?No new EKG today - Personally Reviewed ? ?Physical Exam  ? ?GEN: No acute distress.   ?Neck: No JVD ?Cardiac: Irregularly irregular, no murmurs, rubs, or gallops.  ?Respiratory: Clear to  auscultation bilaterally. On room air.  ?GI: Soft, nontender, non-distended  ?MS: No edema; No deformity. ?Neuro:  Alert and oriented to person/place/time, able to communicate needs, moving all extremity freely   ?Psych: Normal affect  ? ?Labs  ?  ?High Sensitivity Troponin:   ?Recent Labs  ?Lab 05/25/21 ?1532 05/25/21 ?1714  ?TROPONINIHS 12 13  ?   ?Chemistry ?Recent Labs  ?Lab 05/25/21 ?1532 05/25/21 ?2026 05/30/21 ?[id number] 05/31/21 ?[id number] 06/01/21 ?0150  ?NA 139   < > 138 142 134*  ?K 3.7   < > 3.5 3.4* 5.0  ?CL 110   < > 110 109 106  ?CO2 23   < > 21* 21* 23  ?GLUCOSE 122*   < > 204* 178* 128*  ?BUN 9   < > 7 7 7   ?CREATININE 0.75   < > 0.77 0.67 0.70  ?CALCIUM 9.1   < > 8.5* 8.7* 8.7*  ?MG  --    < > 1.8 1.7 2.3  ?PROT 7.5  --   --   --   --   ?ALBUMIN 3.7  --   --   --   --   ?AST 36  --   --   --   --   ?  ALT 47*  --   --   --   --   ?ALKPHOS 74  --   --   --   --   ?BILITOT 0.4  --   --   --   --   ?GFRNONAA >60   < > >60 >60 >60  ?ANIONGAP 6   < > 7 12 5   ? < > = values in this interval not displayed.  ?  ?Lipids No results for input(s): CHOL, TRIG, HDL, LABVLDL, LDLCALC, CHOLHDL in the last 168 hours.  ?Hematology ?Recent Labs  ?Lab 05/30/21 ?[id number] 05/31/21 ?[id number] 06/01/21 ?0150  ?WBC 7.1 6.9 9.9  ?RBC 4.19 4.07 4.45  ?HGB 11.9* 11.5* 12.4  ?HCT 36.8 35.6* 39.6  ?MCV 87.8 87.5 89.0  ?MCH 28.4 28.3 27.9  ?MCHC 32.3 32.3 31.3  ?RDW 13.0 13.2 13.2  ?PLT 201 209 260  ? ?Thyroid  ?Recent Labs  ?Lab 05/25/21 ?1532  ?TSH 0.862  ?  ?BNP ?Recent Labs  ?Lab 05/25/21 ?1720  ?BNP 511.1*  ?  ?DDimer No results for input(s): DDIMER in the last 168 hours.  ? ?Radiology  ?  ?No results found. ? ?Cardiac Studies  ? ?TEE 05/27/21: ? 1. Left ventricular ejection fraction, by estimation, is 50 to 55%. The  ?left ventricle has low normal function.  ? 2. Right ventricular systolic function is normal. The right ventricular  ?size is normal.  ? 3. Left atrial size was severely dilated. A left atrial/left atrial  ?appendage thrombus was  detected.  ? 4. Right atrial size was mildly dilated.  ? 5. The aortic valve is tricuspid. Aortic valve regurgitation is mild. No  ?aortic stenosis is present.  ? 6. Tricuspid valve regurgitation is mild to moderate.  ? 7. A small pericardial effusion is present.  ? 8. The mitral valve is rheumatic. Mild mitral valve regurgitation. Severe  ?mitral stenosis. MG 5mmHg, MVA 1.5 cm^2 by PHT, MVA 1.1 cm^2 by  ?planimetry (measurement was made on 3DQ but images were not transferring  ?to Syngo)  ?  ?  ?TTE 05/26/21: ? 1. Severe rheumatic mitral stenosis is present. MG 10.1 mmHG @ 95 bpm.  ?PHT difficult due to Afib, but PHT ~140 msec which equates to MVA of 1.56  ?cm2. Leaflets are mildly thickened with calcification of the PMVL tip.  ?Anterior leaflet is mobile but the  ?PMVL is restricted. There is mild MR. Would recommend TEE for better  ?characterization including MVA by direct 3D assessment. The mitral valve  ?is rheumatic. Mild mitral valve regurgitation. Severe mitral stenosis. The  ?mean mitral valve gradient is 10.1  ?mmHg with average heart rate of 95 bpm.  ? 2. Left ventricular ejection fraction, by estimation, is 55 to 60%. The  ?left ventricle has normal function. The left ventricle has no regional  ?wall motion abnormalities. Left ventricular diastolic function could not  ?be evaluated.  ? 3. Right ventricular systolic function is normal. The right ventricular  ?size is normal. There is mildly elevated pulmonary artery systolic  ?pressure. The estimated right ventricular systolic pressure is [id number] mmHg.  ? 4. Left atrial size was severely dilated.  ? 5. Right atrial size was mildly dilated.  ? 6. The aortic valve is tricuspid. Aortic valve regurgitation is mild. No  ?aortic stenosis is present.  ? 7. The inferior vena cava is normal in size with <50% respiratory  ?variability, suggesting right atrial pressure of 8 mmHg.  ? ?Patient Profile  ?   ?56  y.o. female with PMH of HTN, latent TB s/p treatment, GERD,  ischemic CVA, who is admitted for chest pain, palpation, fatigue, nonbloody vomiting,  found in new onset of atrial fibrillation/flutter with rapid ventricular rate. Cardiology is consulted and following  since 05/26/21 for AFib/Flutter. Cardiac cath from 2019 showed normal coronary anatomy. TEE from 05/27/21 showed LVEF 50-55%, normal RV, severely dilated LA, LAA thrombus, mitral valve is rheumatic. Mild MR. Severe MS. Cardioversion was initially planned and cancelled subsequently due to LAA thrombus. A fib RVR rate control has been difficult with meds, currently on diltiazem, digoxin, and metoprolol. Anticoagulation regimen is Lovenox bridge to coumadin at this time.  ? ? ? ?Assessment & Plan  ?  ?Atrial flutter/fibrillation  ?- New onset 05/26/21, with intermittent heart palpitation ?- Rate has been difficult to control with meds, unable cardioversion due to LAA thrombus  ?- current rate controlling regimen: digoxin 0.25mg  daily, diltiazem CX 240mg  daily, will reduce metoprolol 25mg  QID to TID today due to symptomatic bradycardia overnight  ?- anticoagulation regimen: Lovenox 80mg  BID bridge to coumadin, INR 1.5, goal 2.5-3.5, follow in coumadin clinic upon discharge  ? ?Large LAA thrombus  ?- Noted on TEE on 05/27/21  ?- Anticoagulation regimen: Lovenox 80mg  BID bridge to coumadin, INR 1.5  ?- Hgb 12.4, no bleeding at this time ? ?Rheumatic mitral valve disease ?Severe MS ?- Noted on TEE on 05/27/21  ?- MG 103mmHg, MVA 1.5 cm^2 by PHT, MVA 1.1 cm^2 by  ?- Per previous rounder, records sent to CV surgery to review ([facility]), Will contact this week   ? ? ? ?For questions or updates, please contact [place] ?Please consult [email or web address] for contact info under  ? ?  ?   ?Signed, ?Margie Billet, NP  ?06/01/2021, 10:00 AM    ? ? ? ?Patient seen and examined, note reviewed with the signed Advanced Practice Provider. I personally reviewed laboratory data, imaging studies and relevant notes. I independently examined the  patient and formulated the important aspects of the plan. I have personally discussed the plan with the patient and/or family. Comments or changes to the note/plan are indicated below. ? ? ?Atrial fibrillation/flutter

## 2021-06-01 NOTE — Progress Notes (Addendum)
? ?HD#6 ?SUBJECTIVE:  ?Patient Summary: Abigail Butler is a 56 y.o. with a pertinent PMH of hypertension, GERD, previous ischemic CVA, and previous history of latent TB s/p treatment, who presented with chest tightness and admitted for new onset atrial flutter with variable AV block. ? ?Overnight Events: none ? ?Interim History: Patient assessed at bedside this AM. Her daughter is at bedside to help with translation. Patient states that her chest tightness feels better than yesterday. She has had some dizziness overnight but feels better now. No other complaints at this time. ? ?OBJECTIVE:  ?Vital Signs: ?Vitals:  ? 05/31/21 1756 05/31/21 2044 05/31/21 2315 06/01/21 0606  ?BP: (!) 102/57 (!) 122/56 106/72 (!) 111/53  ?Pulse: 71 71 70 81  ?Resp:  19  16  ?Temp:  98.8 ?F (37.1 ?C)  97.6 ?F (36.4 ?C)  ?TempSrc:  Oral  Oral  ?SpO2:    98%  ?Weight:      ?Height:      ? ?Supplemental O2: Room Air ?SpO2: 98 % ? ?Filed Weights  ? 05/27/21 1200  ?Weight: 82.6 kg  ? ? ? ?Intake/Output Summary (Last 24 hours) at 06/01/2021 0725 ?Last data filed at 05/31/2021 2200 ?Gross per 24 hour  ?Intake 360 ml  ?Output --  ?Net 360 ml  ? ?Net IO Since Admission: 3,490.13 mL [06/01/21 0725] ? ?Physical Exam: ?Constitutional: Laying in bed With daughter at bedside, no acute distress ?HENT: normocephalic atraumatic, mucous membranes moist ?Eyes: conjunctiva non-erythematous ?Neck: supple, no JVD ?Cardiovascular: regular rate, irregularly irregular rhythm ?Pulmonary/Chest: normal work of breathing on room air, lungs clear to auscultation bilaterally ?MSK: normal bulk and tone ?Skin: warm and dry ?Psych: normal mood and affect ? ?Patient Lines/Drains/Airways Status   ? ? Active Line/Drains/Airways   ? ? Name Placement date Placement time Site Days  ? Peripheral IV 05/25/21 22 G 1" Left Hand 05/25/21  1942  Hand  6  ? Peripheral IV 05/27/21 20 G Posterior;Right Hand 05/27/21  2030  Hand  4  ? ?  ?  ? ?  ? ? ?Pertinent Labs: ? ?  Latest Ref Rng &  Units 06/01/2021  ?  1:50 AM 05/31/2021  ?  3:18 AM 05/30/2021  ?  3:17 AM  ?CBC  ?WBC 4.0 - 10.5 K/uL 9.9   6.9   7.1    ?Hemoglobin 12.0 - 15.0 g/dL 12.4   11.5   11.9    ?Hematocrit 36.0 - 46.0 % 39.6   35.6   36.8    ?Platelets 150 - 400 K/uL 260   209   201    ? ? ? ?  Latest Ref Rng & Units 06/01/2021  ?  1:50 AM 05/31/2021  ?  3:18 AM 05/30/2021  ?  3:17 AM  ?CMP  ?Glucose 70 - 99 mg/dL 128   178   204    ?BUN 6 - 20 mg/dL 7   7   7     ?Creatinine 0.44 - 1.00 mg/dL 0.70   0.67   0.77    ?Sodium 135 - 145 mmol/L 134   142   138    ?Potassium 3.5 - 5.1 mmol/L 5.0   3.4   3.5    ?Chloride 98 - 111 mmol/L 106   109   110    ?CO2 22 - 32 mmol/L 23   21   21     ?Calcium 8.9 - 10.3 mg/dL 8.7   8.7   8.5    ? ? ?  No results for input(s): GLUCAP in the last 72 hours.  ? ?Pertinent Imaging: ?No results found. ? ?ASSESSMENT/PLAN:  ?Assessment: ?Principal Problem: ?  Atrial flutter ([place]) ?Active Problems: ?  Atrial fibrillation with RVR ([place]) ?  Mitral stenosis with insufficiency, rheumatic ? ? ?Abigail Butler is a 56 y.o. with a pertinent PMH of hypertension, GERD, previous ischemic CVA, and previous history of latent TB s/p treatment, who presented with chest tightness and admitted for new onset atrial flutter with variable AV block. TEE showed severe mitral stenosis and thrombus of left atrium. ? ?Plan: ?#New onset atrial fibrillation with RVR ?#Severe Rheumatic MS ?#LAA thrombus ?Heart rate controlled on current regimen with rates between 60-80 overnight.  Current regimen is digoxin 0.25 mg daily, diltiazem 240 mg daily, and metoprolol tartrate 25 mg 3 times daily.  Patient is medically stable for discharge.  Spoke with cardiology and they are okay with discharging patient on digoxin 0.25 mg daily, diltiazem 240 mg daily, and metoprolol succinate 75 mg daily.  With patient's rheumatic mitral stenosis and atrial fibrillation, warfarin is the only approved therapy.  Patient does not want pork derived products.  We will switch to  fondaparinux and make sure patient's INR is therapeutic prior to discharge as cost is a barrier to her taking fondaparinux outpatient. ? ?- Appreciate cardiology recs ?- Follow-up with cardiothoracic surgery, case was sent 3/31 cardiology helping with communication ?- Continue digoxin 0.25 mg daily and diltiazem 240 mg qd ?- Continue metoprolol tartrate 25 mg, then metoprolol tartrate 75 mg qd 4/4 ?- Fondaparinux 7.5 mg qd ?  ?#Hypertension ?New to be stable with systolics in the 0000000 to [id number]. ? ?- Diltiazem 240 mg qd ?- Continue metoprolol tartrate 25 mg, then metoprolol tartrate 75 mg qd 4/4 ? ?Best Practice: ?Diet: Cardiac diet ?IVF: Fluids: none ?VTE: Warfarin, bridging with Fondaparinux ?Code: Full ?AB: none ?Therapy Recs: None, DME: none ?Family Contact: Daughter, at bedside. ?DISPO: Anticipated discharge in 1-3 days to Home pending Medical stability. ? ?Signature: ?Daleen Bo. Archer Moist, D.O.  ?Internal Medicine Resident, PGY-1 ?[facility]  ?Pager: [phone] ?7:25 AM, 06/01/2021  ? ?Please contact the on call pager after 5 pm and on weekends at [phone].  ?
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
## 2021-06-01 NOTE — Progress Notes (Addendum)
ANTICOAGULATION CONSULT NOTE - Follow Up Consult ? ?Pharmacy Consult for Warfarin ?Indication: atrial fibrillation and Large LA thrombus  ? ?Allergies  ?Allergen Reactions  ? Pork-Derived Products Other (See Comments)  ?  Religous belief  ? ? ?Patient Measurements: ?Height: 5\' 5"  (165.1 cm) ?Weight: 82.6 kg (182 lb) ?IBW/kg (Calculated) : 57 ? ?Vital Signs: ?Temp: 97.6 ?F (36.4 ?C) (04/03 0606) ?Temp Source: Oral (04/03 0606) ?BP: 118/87 (04/03 0904) ?Pulse Rate: 81 (04/03 0904) ? ?Labs: ?Recent Labs  ?  05/30/21 ?[id number] 05/31/21 ?[id number] 06/01/21 ?0150  ?HGB 11.9* 11.5* 12.4  ?HCT 36.8 35.6* 39.6  ?PLT 201 209 260  ?LABPROT 14.6 14.9 18.5*  ?INR 1.1 1.2 1.5*  ?CREATININE 0.77 0.67 0.70  ? ? ?Estimated Creatinine Clearance: 82.3 mL/min (by C-G formula based on SCr of 0.7 mg/dL). ? ?  ? ?Assessment: ?56 yo F with new atrial fibrillation with RVR and Large LA appendage thrombus. No anticoagulation PTA. Pharmacy consulted for warfarin dosing.  ? ?Patient does not want to use pork-derived products so stopped heparin and enoxaparin. Started fondaparinux (synthetic) for bridging. Last dose of enoxaparin 4/3 9am.  ? ?INR up to 1.5, still subtherapeutic. Hgb 11.5, plt 209. No signs/symptoms of bleeding noted per RN.  ? ? ?Goal of Therapy:  ?INR 2-3 ?Monitor platelets by anticoagulation protocol: Yes ?  ?Plan:  ?Stop Fondaparinux 7.5mg  daily when INR is 2 or greater ?Warfarin 8 mg x 1 today  ?Daily INR, CBC  ?Monitor for signs/symptoms of bleeding  ? ? ?Benetta Spar, PharmD, BCPS, BCCP ?Clinical Pharmacist ? ?Please check AMION for all [place] phone numbers ?After 10:00 PM, call [place] [phone] ? ?

## 2021-06-02 DIAGNOSIS — I350 Nonrheumatic aortic (valve) stenosis: Secondary | ICD-10-CM

## 2021-06-02 DIAGNOSIS — I484 Atypical atrial flutter: Secondary | ICD-10-CM

## 2021-06-02 LAB — BASIC METABOLIC PANEL
Anion gap: 7 (ref 5–15)
BUN: 13 mg/dL (ref 6–20)
CO2: 23 mmol/L (ref 22–32)
Calcium: 8.8 mg/dL — ABNORMAL LOW (ref 8.9–10.3)
Chloride: 108 mmol/L (ref 98–111)
Creatinine, Ser: 0.82 mg/dL (ref 0.44–1.00)
GFR, Estimated: >60 mL/min (ref >60)
Glucose, Bld: 116 mg/dL — ABNORMAL HIGH (ref 70–99)
Potassium: 4.6 mmol/L (ref 3.5–5.1)
Sodium: 138 mmol/L (ref 135–145)

## 2021-06-02 LAB — CBC
HCT: 39.8 % (ref 36.0–46.0)
Hemoglobin: 13.1 g/dL (ref 12.0–15.0)
MCH: 28.9 pg (ref 26.0–34.0)
MCHC: 32.9 g/dL (ref 30.0–36.0)
MCV: 87.9 fL (ref 80.0–100.0)
Platelets: 276 10*3/uL (ref 150–400)
RBC: 4.53 MIL/uL (ref 3.87–5.11)
RDW: 12.9 % (ref 11.5–15.5)
WBC: 10 10*3/uL (ref 4.0–10.5)
nRBC: 0 % (ref 0.0–0.2)

## 2021-06-02 LAB — PROTIME-INR
INR: 2.1 — ABNORMAL HIGH (ref 0.8–1.2)
Prothrombin Time: 23.2 seconds — ABNORMAL HIGH (ref 11.4–15.2)

## 2021-06-02 LAB — GLUCOSE, CAPILLARY: Glucose-Capillary: 144 mg/dL — ABNORMAL HIGH (ref 70–99)

## 2021-06-02 LAB — DIGOXIN LEVEL: Digoxin Level: 1.1 ng/mL (ref 0.8–2.0)

## 2021-06-02 LAB — MAGNESIUM: Magnesium: 2.1 mg/dL (ref 1.7–2.4)

## 2021-06-02 MED ORDER — METOPROLOL SUCCINATE ER 50 MG PO TB24: 50.0000 mg | ORAL_TABLET | Freq: Every day | ORAL | Status: DC

## 2021-06-02 MED ORDER — WARFARIN SODIUM 5 MG PO TABS
6.0000 mg | ORAL_TABLET | Freq: Once | ORAL | Status: AC
  Administered 2021-06-02: 6 mg via ORAL
  Filled 2021-06-02: qty 1

## 2021-06-02 NOTE — Progress Notes (Signed)
ANTICOAGULATION CONSULT NOTE - Follow Up Consult ? ?Pharmacy Consult for Warfarin ?Indication: atrial fibrillation and Large LA thrombus  ? ?Allergies  ?Allergen Reactions  ? Pork-Derived Products Other (See Comments)  ?  Religous belief  ? ? ?Patient Measurements: ?Height: 5\' 5"  (165.1 cm) ?Weight: 82.6 kg (182 lb) ?IBW/kg (Calculated) : 57 ? ?Vital Signs: ?Temp: 97.9 ?F (36.6 ?C) (04/04 0604) ?Temp Source: Oral (04/04 [id number]) ?BP: 122/58 (04/04 [id number]) ?Pulse Rate: 89 (04/04 0907) ? ?Labs: ?Recent Labs  ?  05/31/21 ?0318 06/01/21 ?0150 06/02/21 ?0259  ?HGB 11.5* 12.4 13.1  ?HCT 35.6* 39.6 39.8  ?PLT 209 260 276  ?LABPROT 14.9 18.5* 23.2*  ?INR 1.2 1.5* 2.1*  ?CREATININE 0.67 0.70 0.82  ? ? ? ?Estimated Creatinine Clearance: 80.3 mL/min (by C-G formula based on SCr of 0.82 mg/dL). ? ?  ? ?Assessment: ?56 yo F with new atrial fibrillation with RVR and Large LA appendage thrombus. No anticoagulation PTA. Pharmacy consulted for warfarin dosing.  ? ?Patient does not want to use pork-derived products so stopped heparin and enoxaparin. Started fondaparinux (synthetic) for bridging. Last dose of enoxaparin 4/3 9am.  ? ?INR up to 2.1 and therapeutic. Hgb, and plt stable. No signs/symptoms of bleeding noted per RN. Stop fondaparinux.  ? ? ?Goal of Therapy:  ?INR 2-3 ?Monitor platelets by anticoagulation protocol: Yes ?  ?Plan:  ?Stop Fondaparinux 7.5mg   ?Warfarin 6 mg x1 ?Daily INR, CBC  ?Monitor for signs/symptoms of bleeding  ?For discharge: suggest warfarin 6 mg daily with INR check by Friday  ?Patient will have INR follow up with [facility] ? ?Benetta Spar, PharmD, BCPS, BCCP ?Clinical Pharmacist ? ?Please check AMION for all [place] phone numbers ?After 10:00 PM, call [place] [phone] ? ?

## 2021-06-02 NOTE — Progress Notes (Addendum)
? ?Progress Note ? ?Patient Name: Abigail Butler ?Date of Encounter: 06/02/2021 ? ?[facility] Cardiologist: Jenean Lindau, MD  ? ?Subjective  ? ?She is resting in bed and states she has a headache. She denied chest pain and SOB and dizziness.  ? ?Inpatient Medications  ?  ?Scheduled Meds: ? digoxin  0.25 mg Oral Daily  ? diltiazem  240 mg Oral Daily  ? fondaparinux (ARIXTRA) injection  7.5 mg Subcutaneous Q24H  ? metoprolol succinate  75 mg Oral Daily  ? pneumococcal 20-valent conjugate vaccine  0.5 mL Intramuscular Tomorrow-1000  ? senna-docusate  2 tablet Oral QHS  ? Warfarin - Pharmacist Dosing Inpatient   Does not apply [id number]  ? ?Continuous Infusions: ? sodium chloride Stopped (05/29/21 1824)  ? ?PRN Meds: ?acetaminophen, alum & mag hydroxide-simeth, ondansetron (ZOFRAN) IV  ? ?Vital Signs  ?  ?Vitals:  ? 06/02/21 0100 06/02/21 0604 06/02/21 [id number] 06/02/21 0907  ?BP: (!) 90/45 117/72  (!) 122/58  ?Pulse: 63  89 89  ?Resp: 17 15    ?Temp: 98.3 ?F (36.8 ?C) 97.9 ?F (36.6 ?C)    ?TempSrc: Oral Oral    ?SpO2: 97% 96%    ?Weight:      ?Height:      ? ?No intake or output data in the 24 hours ending 06/02/21 0911 ? ? ?  05/27/2021  ? 12:00 PM 12/08/2017  ?  2:08 PM 05/18/2017  ? 10:07 AM  ?Last 3 Weights  ?Weight (lbs) 182 lb 183 lb 9.6 oz 191 lb  ?Weight (kg) 82.555 kg 83.28 kg 86.637 kg  ?   ? ?Telemetry  ?  ?A fib with ventricular rate of 60-100, pauses less than 2 seconds noted - Personally Reviewed ? ?ECG  ?  ?No new EKG today - Personally Reviewed ? ?Physical Exam  ? ?GEN: No acute distress.   ?Neck: No JVD ?Cardiac: Irregularly irregular, no murmurs, rubs, or gallops.  ?Respiratory: Clear to auscultation bilaterally. On room air.  ?GI: Soft, nontender, non-distended  ?MS: No edema; No deformity. ?Neuro:  Alert and oriented to person/place/time, able to communicate needs, moving all extremity freely   ?Psych: Normal affect  ? ?Labs  ?  ?High Sensitivity Troponin:   ?Recent Labs  ?Lab 05/25/21 ?1532  05/25/21 ?1714  ?TROPONINIHS 12 13  ?   ?Chemistry ?Recent Labs  ?Lab 05/31/21 ?0318 06/01/21 ?0150 06/02/21 ?0259  ?NA 142 134* 138  ?K 3.4* 5.0 4.6  ?CL 109 106 108  ?CO2 21* 23 23  ?GLUCOSE 178* 128* 116*  ?BUN 7 7 13   ?CREATININE 0.67 0.70 0.82  ?CALCIUM 8.7* 8.7* 8.8*  ?MG 1.7 2.3 2.1  ?GFRNONAA >60 >60 >60  ?ANIONGAP 12 5 7   ?  ?Lipids No results for input(s): CHOL, TRIG, HDL, LABVLDL, LDLCALC, CHOLHDL in the last 168 hours.  ?Hematology ?Recent Labs  ?Lab 05/31/21 ?0318 06/01/21 ?0150 06/02/21 ?0259  ?WBC 6.9 9.9 10.0  ?RBC 4.07 4.45 4.53  ?HGB 11.5* 12.4 13.1  ?HCT 35.6* 39.6 39.8  ?MCV 87.5 89.0 87.9  ?MCH 28.3 27.9 28.9  ?MCHC 32.3 31.3 32.9  ?RDW 13.2 13.2 12.9  ?PLT 209 260 276  ? ?Thyroid  ?No results for input(s): TSH, FREET4 in the last 168 hours. ?  ?BNP ?No results for input(s): BNP, PROBNP in the last 168 hours. ?  ?DDimer No results for input(s): DDIMER in the last 168 hours.  ? ?Radiology  ?  ?No results found. ? ?Cardiac Studies  ? ?TEE 05/27/21: ?  1. Left ventricular ejection fraction, by estimation, is 50 to 55%. The  ?left ventricle has low normal function.  ? 2. Right ventricular systolic function is normal. The right ventricular  ?size is normal.  ? 3. Left atrial size was severely dilated. A left atrial/left atrial  ?appendage thrombus was detected.  ? 4. Right atrial size was mildly dilated.  ? 5. The aortic valve is tricuspid. Aortic valve regurgitation is mild. No  ?aortic stenosis is present.  ? 6. Tricuspid valve regurgitation is mild to moderate.  ? 7. A small pericardial effusion is present.  ? 8. The mitral valve is rheumatic. Mild mitral valve regurgitation. Severe  ?mitral stenosis. MG 66mmHg, MVA 1.5 cm^2 by PHT, MVA 1.1 cm^2 by  ?planimetry (measurement was made on 3DQ but images were not transferring  ?to [employer])  ?  ?  ?TTE 05/26/21: ? 1. Severe rheumatic mitral stenosis is present. MG 10.1 mmHG @ 95 bpm.  ?PHT difficult due to Afib, but PHT ~140 msec which equates to MVA of  1.56  ?cm2. Leaflets are mildly thickened with calcification of the PMVL tip.  ?Anterior leaflet is mobile but the  ?PMVL is restricted. There is mild MR. Would recommend TEE for better  ?characterization including MVA by direct 3D assessment. The mitral valve  ?is rheumatic. Mild mitral valve regurgitation. Severe mitral stenosis. The  ?mean mitral valve gradient is 10.1  ?mmHg with average heart rate of 95 bpm.  ? 2. Left ventricular ejection fraction, by estimation, is 55 to 60%. The  ?left ventricle has normal function. The left ventricle has no regional  ?wall motion abnormalities. Left ventricular diastolic function could not  ?be evaluated.  ? 3. Right ventricular systolic function is normal. The right ventricular  ?size is normal. There is mildly elevated pulmonary artery systolic  ?pressure. The estimated right ventricular systolic pressure is 99991111 mmHg.  ? 4. Left atrial size was severely dilated.  ? 5. Right atrial size was mildly dilated.  ? 6. The aortic valve is tricuspid. Aortic valve regurgitation is mild. No  ?aortic stenosis is present.  ? 7. The inferior vena cava is normal in size with <50% respiratory  ?variability, suggesting right atrial pressure of 8 mmHg.  ? ?Patient Profile  ?   ?56 y.o. female with PMH of HTN, latent TB s/p treatment, GERD, ischemic CVA, who is admitted for chest pain, palpation, fatigue, nonbloody vomiting,  found in new onset of atrial fibrillation/flutter with rapid ventricular rate. Cardiology is consulted and following  since 05/26/21 for AFib/Flutter. Cardiac cath from 2019 showed normal coronary anatomy. TEE from 05/27/21 showed LVEF 50-55%, normal RV, severely dilated LA, LAA thrombus, mitral valve is rheumatic. Mild MR. Severe MS. Cardioversion was initially planned and cancelled subsequently due to LAA thrombus. A fib RVR rate control has been difficult with meds, currently on diltiazem, digoxin, and metoprolol. Anticoagulation regimen is fondaparinux bridge to  coumadin at this time.  ? ? ? ?Assessment & Plan  ?  ?Atrial flutter/fibrillation  ?- New onset 05/26/21, with intermittent heart palpitation ?- Rate has been difficult to control with meds, unable cardioversion due to LAA thrombus  ?- current rate controlling regimen: digoxin 0.25mg  daily, diltiazem CX 240mg  daily, metoprolol 25mg  TID (reduced since 4/3 due to symptomatic bradycardia) ?- anticoagulation regimen: fondaparinux (patient prefer pork free products) bridge to coumadin, INR 2.1, at target 2-3- follow up with cardiology has been arranged on 06/12/21 ? ? ?Large left atrial appendage thrombus  ?- Noted on  TEE on 05/27/21  ?- Anticoagulation regimen: fondaparinux bridge to coumadin, INR 2.1 ?- Hgb 13.1, no bleeding at this time ? ?Rheumatic mitral valve disease ?Severe MS ?- Noted on TEE on 05/27/21  ?- MG 57mmHg, MVA 1.5 cm^2 by PHT, MVA 1.1 cm^2 by  ?- Per previous rounder, records sent to CV surgery to review ([facility]), Will contact this week   ?- Anticoagulation regimen: fondaparinux bridge to coumadin, INR 2.1 ? ? ?For questions or updates, please contact [place] ?Please consult [email or web address] for contact info under  ?   ?Signed, ?Margie Billet, NP  ?06/02/2021, 9:11 AM    ? ?Patient seen and examined, note reviewed with the signed Advanced Practice Provider. I personally reviewed laboratory data, imaging studies and relevant notes. I independently examined the patient and formulated the important aspects of the plan. I have personally discussed the plan with the patient and/or family. Comments or changes to the note/plan are indicated below. ? ?The onset atrial fibrillation with rheumatic mitral stenosis and left atrial appendage clot.  She needs to be on anticoagulation with Coumadin.  INR goal 2-3.  We will continue to monitor.  She will follow-up in our clinic for her INR checks-we will set this up for the patient. ? ?I spoke to the patient and her daughter is very important that she follow-up.  She may  need surgical intervention for her severe mitral valve disease.  She has a follow-up consultation visit with CT surgery in the outpatient setting. ? ?Berniece Salines DO, MS FACC ?Attending Cardiologist ?[facility]

## 2021-06-02 NOTE — Progress Notes (Signed)
Mobility Specialist Progress Note  ? ? 06/02/21 1611  ?Mobility  ?Activity Dangled on edge of bed;Stood at bedside  ?Level of Assistance Standby assist, set-up cues, supervision of patient - no hands on  ?Assistive Device None  ?Activity Response Tolerated fair  ?$Mobility charge 1 Mobility  ? ?Pt received sitting EOB and agreeable to check BP to attempt ambulation. After sitting up BP, pt c/o dizziness then that her body felt very heavy but willing to stand. Unable to stand for 3 minutes. Decided not to ambulate d/t symptoms, RN notified. Left sitting EOB with call bell in reach.  ? ?Supine HR, BP(map): 72, 96/62 (75) ?Sitting HR, BP(map): 75, 113/65 (77) ?Standing 0 minute HR, BP(map): 78, 99/60 (72) ? ?[place] ?Mobility Specialist  ?  ?

## 2021-06-02 NOTE — Progress Notes (Addendum)
? ?HD#7 ?SUBJECTIVE:  ?Patient Summary: Abigail Butler is a 56 y.o. with a pertinent PMH of hypertension, GERD, previous ischemic CVA, and previous history of latent TB s/p treatment, who presented with chest tightness and admitted for new onset atrial flutter with variable AV block. ?  ?Overnight Events: Hypotensive overnight, night team was not notified and she improved without intervention. ?  ?Interim History: Patient assessed at bedside this AM. Her daughter is at bedside to help with translation. She had some dizziness overnight but feels better now. No other complaints at this time. ? ?OBJECTIVE:  ?Vital Signs: ?Vitals:  ? 06/02/21 1426 06/02/21 1602 06/02/21 1604 06/02/21 1606  ?BP:  96/62 113/65 99/60  ?Pulse:      ?Resp: 18 18 19 20   ?Temp:      ?TempSrc:      ?SpO2:      ?Weight:      ?Height:      ? ?Supplemental O2: Room Air ?SpO2: 96 % ? ?Filed Weights  ? 05/27/21 1200  ?Weight: 82.6 kg  ? ? ? ?Intake/Output Summary (Last 24 hours) at 06/02/2021 1641 ?Last data filed at 06/02/2021 0930 ?Gross per 24 hour  ?Intake 240 ml  ?Output --  ?Net 240 ml  ? ?Net IO Since Admission: 3,730.13 mL [06/02/21 1641] ? ?Physical Exam: ?Physical Exam ?Constitutional:   ?   General: She is not in acute distress. ?HENT:  ?   Mouth/Throat:  ?   Mouth: Mucous membranes are moist.  ?   Pharynx: Oropharynx is clear.  ?Cardiovascular:  ?   Rate and Rhythm: Normal rate. Rhythm irregular.  ?   Heart sounds: Murmur heard.  ?Pulmonary:  ?   Effort: Pulmonary effort is normal.  ?   Breath sounds: Normal breath sounds.  ?Skin: ?   General: Skin is warm and dry.  ?Neurological:  ?   General: No focal deficit present.  ?   Mental Status: She is alert and oriented to person, place, and time.  ? ? ?Patient Lines/Drains/Airways Status   ? ? Active Line/Drains/Airways   ? ? Name Placement date Placement time Site Days  ? Peripheral IV 05/25/21 22 G 1" Left Hand 05/25/21  1942  Hand  8  ? Peripheral IV 05/27/21 20 G Posterior;Right Hand 05/27/21   2030  Hand  6  ? ?  ?  ? ?  ? ? ?Pertinent Labs: ? ?  Latest Ref Rng & Units 06/02/2021  ?  2:59 AM 06/01/2021  ?  1:50 AM 05/31/2021  ?  3:18 AM  ?CBC  ?WBC 4.0 - 10.5 K/uL 10.0   9.9   6.9    ?Hemoglobin 12.0 - 15.0 g/dL 07/31/2021   33.3   54.5    ?Hematocrit 36.0 - 46.0 % 39.8   39.6   35.6    ?Platelets 150 - 400 K/uL 276   260   209    ? ? ? ?  Latest Ref Rng & Units 06/02/2021  ?  2:59 AM 06/01/2021  ?  1:50 AM 05/31/2021  ?  3:18 AM  ?CMP  ?Glucose 70 - 99 mg/dL 07/31/2021   638   937    ?BUN 6 - 20 mg/dL 13   7   7     ?Creatinine 0.44 - 1.00 mg/dL 342     8.76    ?Sodium 135 - 145 mmol/L 138   134   142    ?Potassium 3.5 - 5.1 mmol/L 4.6  5.0   3.4    ?Chloride 98 - 111 mmol/L 108   106   109    ?CO2 22 - 32 mmol/L 23   23   21     ?Calcium 8.9 - 10.3 mg/dL 8.8   8.7   8.7    ? ? ?Recent Labs  ?  06/02/21 ?1353  ?GLUCAP 144*  ?  ? ?Pertinent Imaging: ?No results found. ? ?ASSESSMENT/PLAN:  ?Assessment: ?Principal Problem: ?  Atrial flutter (HCC) ?Active Problems: ?  Atrial fibrillation with RVR (HCC) ?  Mitral stenosis with insufficiency, rheumatic ? ? ?Abigail Butler is a 56 y.o. with a pertinent PMH of hypertension, GERD, previous ischemic CVA, and previous history of latent TB s/p treatment, who presented with chest tightness and admitted for new onset atrial flutter with variable AV block. TEE showed severe mitral stenosis and thrombus of left atrium. ? ?Plan: ?#New onset atrial fibrillation with RVR ?#Severe Rheumatic MS ?#LAA thrombus ?Patient was hypotensive overnight. Checked with nursing staff and BP was taken with patient laying on side. When rechecked was normotensive. Her heart rate is well controlled, but patient is dizzy with lower blood pressures.  We will touch base with cardiology and decrease metoprolol succinate from 75 mg to 50 mg.  Hopefully this will resolve dizziness and will have patient ambulate tomorrow to see if symptoms have resolved. ? ?INR is at 2.1 with INR goal 2-3 for MS with atrial  fibrillation. ?  ?- Appreciate cardiology recs ?- Follow-up with cardiothoracic surgery, case was sent 3/31 cardiology helping with communication ?- Continue digoxin 0.25 mg daily and diltiazem 240 mg qd ?-Decrease metoprolol succinate from 75 mg to 50 mg ?-Discontinue fondaparinux as patient is therapeutic on Coumadin ?-Patient has follow-up in internal medicine clinic on 4/6 at 8:45 AM. ?  ?#Hypertension ?BP low but normotensive on current regimen.  Patient is symptomatic with dizziness while laying down.  Will decrease metoprolol succinate from 75 mg to 50 mg to see if this helps with symptoms. ? ?- Diltiazem 240 mg qd ?- Metoprolol succinate 50mg  daily ?  ?Best Practice: ?Diet: Cardiac diet ?IVF: Fluids: none ?VTE: Warfarin ?Code: Full ?AB: none ?Therapy Recs: None, DME: none ?Family Contact: Daughter, at bedside. Called and updated daughter this afternoon that patient would not be going home today. ?DISPO: Anticipated discharge tomorrow to Home pending Medical stability. ? ?Signature: ?6/6, D.O. ?Internal Medicine Resident, PGY-1 ? Internal Medicine Residency  ?Pager: [phone] ?4:41 PM, 06/02/2021  ? ?Please contact the on call pager after 5 pm and on weekends at [phone].  ?

## 2021-06-03 ENCOUNTER — Other Ambulatory Visit (HOSPITAL_COMMUNITY): Payer: Self-pay

## 2021-06-03 DIAGNOSIS — I483 Typical atrial flutter: Secondary | ICD-10-CM

## 2021-06-03 LAB — MAGNESIUM: Magnesium: 1.9 mg/dL (ref 1.7–2.4)

## 2021-06-03 LAB — BASIC METABOLIC PANEL
Anion gap: 5 (ref 5–15)
BUN: 14 mg/dL (ref 6–20)
CO2: 23 mmol/L (ref 22–32)
Calcium: 9.1 mg/dL (ref 8.9–10.3)
Chloride: 109 mmol/L (ref 98–111)
Creatinine, Ser: 0.71 mg/dL (ref 0.44–1.00)
GFR, Estimated: >60 mL/min (ref >60)
Glucose, Bld: 135 mg/dL — ABNORMAL HIGH (ref 70–99)
Potassium: 4.2 mmol/L (ref 3.5–5.1)
Sodium: 137 mmol/L (ref 135–145)

## 2021-06-03 LAB — CBC
HCT: 39.5 % (ref 36.0–46.0)
Hemoglobin: 12.5 g/dL (ref 12.0–15.0)
MCH: 27.8 pg (ref 26.0–34.0)
MCHC: 31.6 g/dL (ref 30.0–36.0)
MCV: 88 fL (ref 80.0–100.0)
Platelets: 271 10*3/uL (ref 150–400)
RBC: 4.49 MIL/uL (ref 3.87–5.11)
RDW: 12.9 % (ref 11.5–15.5)
WBC: 8.3 10*3/uL (ref 4.0–10.5)
nRBC: 0 % (ref 0.0–0.2)

## 2021-06-03 LAB — PROTIME-INR
INR: 1.9 — ABNORMAL HIGH (ref 0.8–1.2)
Prothrombin Time: 21.9 seconds — ABNORMAL HIGH (ref 11.4–15.2)

## 2021-06-03 MED ORDER — WARFARIN SODIUM 5 MG PO TABS
10.0000 mg | ORAL_TABLET | Freq: Once | ORAL | Status: AC
  Administered 2021-06-03: 10 mg via ORAL
  Filled 2021-06-03: qty 2

## 2021-06-03 MED ORDER — MAGNESIUM SULFATE 2 GM/50ML IV SOLN
2.0000 g | Freq: Once | INTRAVENOUS | Status: AC
  Administered 2021-06-03: 2 g via INTRAVENOUS
  Filled 2021-06-03: qty 50

## 2021-06-03 MED ORDER — DIGOXIN 250 MCG PO TABS
0.2500 mg | ORAL_TABLET | Freq: Every day | ORAL | 0 refills | Status: DC
  Filled 2021-06-03: qty 30, 30d supply, fill #0

## 2021-06-03 MED ORDER — WARFARIN SODIUM 7.5 MG PO TABS
7.5000 mg | ORAL_TABLET | Freq: Every day | ORAL | 0 refills | Status: DC
  Filled 2021-06-03: qty 225, 225d supply, fill #0

## 2021-06-03 MED ORDER — WARFARIN SODIUM 7.5 MG PO TABS
7.5000 mg | ORAL_TABLET | Freq: Every day | ORAL | 0 refills | Status: DC
  Filled 2021-06-03: qty 30, 30d supply, fill #0
  Filled 2021-06-03: qty 225, 225d supply, fill #0
  Filled 2021-07-03: qty 30, 30d supply, fill #0
  Filled 2021-08-07: qty 30, 30d supply, fill #1
  Filled 2021-09-16: qty 30, 30d supply, fill #2
  Filled 2021-10-22: qty 30, 30d supply, fill #3
  Filled 2021-11-17: qty 30, 30d supply, fill #4
  Filled 2021-12-14: qty 30, 30d supply, fill #5
  Filled 2022-01-15: qty 15, 15d supply, fill #6

## 2021-06-03 MED ORDER — FONDAPARINUX SODIUM 7.5 MG/0.6ML ~~LOC~~ SOLN
7.5000 mg | SUBCUTANEOUS | Status: DC
  Administered 2021-06-03: 7.5 mg via SUBCUTANEOUS
  Filled 2021-06-03: qty 0.6

## 2021-06-03 MED ORDER — DILTIAZEM HCL ER COATED BEADS 240 MG PO CP24
240.0000 mg | ORAL_CAPSULE | Freq: Every day | ORAL | Status: DC
  Administered 2021-06-03: 240 mg via ORAL
  Filled 2021-06-03: qty 1

## 2021-06-03 MED ORDER — DILTIAZEM HCL ER COATED BEADS 240 MG PO CP24
240.0000 mg | ORAL_CAPSULE | Freq: Every day | ORAL | 0 refills | Status: DC
  Filled 2021-06-03: qty 30, 30d supply, fill #0

## 2021-06-03 MED ORDER — WARFARIN SODIUM 7.5 MG PO TABS
7.5000 mg | ORAL_TABLET | Freq: Once | ORAL | 0 refills | Status: DC
Start: 2021-06-03 — End: 2021-06-03
  Filled 2021-06-03: qty 30, 30d supply, fill #0

## 2021-06-03 NOTE — Progress Notes (Addendum)
ANTICOAGULATION CONSULT NOTE - Follow Up Consult ? ?Pharmacy Consult for Warfarin ?Indication: atrial fibrillation and Large LA thrombus  ? ?Allergies  ?Allergen Reactions  ? Pork-Derived Products Other (See Comments)  ?  Religous belief  ? ? ?Patient Measurements: ?Height: 5\' 5"  (165.1 cm) ?Weight: 82.6 kg (182 lb) ?IBW/kg (Calculated) : 57 ? ?Vital Signs: ?Temp: 98.1 ?F (36.7 ?C) (04/05 [id number]) ?Temp Source: Oral (04/05 [id number]) ?BP: 163/89 (04/05 [id number]) ?Pulse Rate: 75 (04/05 0430) ? ?Labs: ?Recent Labs  ?  06/01/21 ?0150 06/02/21 ?0259 06/03/21 ?0244  ?HGB 12.4 13.1 12.5  ?HCT 39.6 39.8 39.5  ?PLT 260 276 271  ?LABPROT 18.5* 23.2* 21.9*  ?INR 1.5* 2.1* 1.9*  ?CREATININE 0.70 0.82 0.71  ? ? ? ?Estimated Creatinine Clearance: 82.3 mL/min (by C-G formula based on SCr of 0.71 mg/dL). ? ?  ? ?Assessment: ?56 yo F with new atrial fibrillation with RVR and Large LA appendage thrombus. No anticoagulation PTA. Pharmacy consulted for warfarin dosing.  ? ?INR down to 1.9 and just subtherapeutic. INR dropped on average of 7mg  in the last 48 hrs. Hgb, and plt stable. No signs/symptoms of bleeding noted per RN.   ? ? ?Goal of Therapy:  ?INR 2-3 ?Monitor platelets by anticoagulation protocol: Yes ?  ?Plan:   ?Warfarin 10 mg x1 ?Daily INR, CBC  ?Monitor for signs/symptoms of bleeding  ?Per team, give fondaparinux 7.5mg  x1 today  ?For discharge: suggest warfarin 7.5 mg daily with INR check by Friday  ?Patient will have INR follow up with [facility] ? ?Benetta Spar, PharmD, BCPS, BCCP ?Clinical Pharmacist ? ?Please check AMION for all [place] phone numbers ?After 10:00 PM, call [place] [phone] ? ?

## 2021-06-03 NOTE — Plan of Care (Signed)
?  Problem: Education: ?Goal: Knowledge of General Education information will improve ?Description: Including pain rating scale, medication(s)/side effects and non-pharmacologic comfort measures ?Outcome: Adequate for Discharge ?  ?Problem: Health Behavior/Discharge Planning: ?Goal: Ability to manage health-related needs will improve ?Outcome: Adequate for Discharge ?  ?Problem: Clinical Measurements: ?Goal: Ability to maintain clinical measurements within normal limits will improve ?Outcome: Adequate for Discharge ?Goal: Will remain free from infection ?Outcome: Adequate for Discharge ?Goal: Diagnostic test results will improve ?Outcome: Adequate for Discharge ?Goal: Respiratory complications will improve ?Outcome: Adequate for Discharge ?Goal: Cardiovascular complication will be avoided ?Outcome: Adequate for Discharge ?  ?Problem: Activity: ?Goal: Risk for activity intolerance will decrease ?Outcome: Adequate for Discharge ?  ?Problem: Nutrition: ?Goal: Adequate nutrition will be maintained ?Outcome: Adequate for Discharge ?  ?Problem: Coping: ?Goal: Level of anxiety will decrease ?Outcome: Adequate for Discharge ?  ?Problem: Elimination: ?Goal: Will not experience complications related to bowel motility ?Outcome: Adequate for Discharge ?Goal: Will not experience complications related to urinary retention ?Outcome: Adequate for Discharge ?  ?Problem: Pain Managment: ?Goal: General experience of comfort will improve ?Outcome: Adequate for Discharge ?  ?Problem: Safety: ?Goal: Ability to remain free from injury will improve ?Outcome: Adequate for Discharge ?  ?Problem: Skin Integrity: ?Goal: Risk for impaired skin integrity will decrease ?Outcome: Adequate for Discharge ?  ?Problem: Education: ?Goal: Knowledge of disease or condition will improve ?Outcome: Adequate for Discharge ?Goal: Understanding of medication regimen will improve ?Outcome: Adequate for Discharge ?Goal: Individualized Educational  Video(s) ?Outcome: Adequate for Discharge ?  ?Problem: Activity: ?Goal: Ability to tolerate increased activity will improve ?Outcome: Adequate for Discharge ?  ?Problem: Cardiac: ?Goal: Ability to achieve and maintain adequate cardiopulmonary perfusion will improve ?Outcome: Adequate for Discharge ?  ?Problem: Health Behavior/Discharge Planning: ?Goal: Ability to safely manage health-related needs after discharge will improve ?Outcome: Adequate for Discharge ?  ?Problem: Inadequate Intake (NI-2.1) ?Goal: Food and/or nutrient delivery ?Description: Individualized approach for food/nutrient provision. ?Outcome: Adequate for Discharge ?  ?

## 2021-06-03 NOTE — Discharge Summary (Addendum)
? ?Name: Abigail Butler ?MRN: [id number] ?DOB: 01/10/1966 56 y.o. ?PCP: Jose Persia, MD ? ?Date of Admission: 05/25/2021  3:09 PM ?Date of Discharge: 06/03/21 ?Attending Physician: Charise Killian, MD ? ?Discharge Diagnosis: ?1.  New onset atrial fibrillation with RVR ?2.  Severe rheumatic mitral stenosis ?3.  Large left atrial appendage thrombus ? ?Chronic: ?4.  History of CVA ?5. Hypertension ?6.  History of H. pylori infection ?7.  Pre-Diabetes ? ?Discharge Medications: ?Allergies as of 06/03/2021   ? ?   Reactions  ? Pork-derived Products Other (See Comments)  ? Religous belief  ? ?  ? ?  ?Medication List  ?  ? ?STOP taking these medications   ? ?hydrochlorothiazide 12.5 MG capsule ?Commonly known as: Microzide ?  ? ?  ? ?TAKE these medications   ? ?acetaminophen 500 MG tablet ?Commonly known as: TYLENOL ?Take 2 tablets (1,000 mg total) by mouth every 6 (six) hours as needed. For pain ?  ?atorvastatin 40 MG tablet ?Commonly known as: Lipitor ?Take 1 tablet (40 mg total) by mouth daily. ?  ?Cartia XT 240 MG 24 hr capsule ?Generic drug: diltiazem ?Take 1 capsule (240 mg total) by mouth daily. ?Start taking on: June 04, 2021 ?  ?digoxin 0.25 MG tablet ?Commonly known as: LANOXIN ?Take 1 tablet (0.25 mg total) by mouth daily. ?  ?fluticasone 50 MCG/ACT nasal spray ?Commonly known as: Flonase ?Place 1 spray into both nostrils daily. ?  ?loratadine 10 MG tablet ?Commonly known as: Claritin ?Take 1 tablet (10 mg total) by mouth daily. ?  ?nitroGLYCERIN 0.4 MG SL tablet ?Commonly known as: NITROSTAT ?Place 1 tablet (0.4 mg total) under the tongue every 5 (five) minutes as needed. ?  ?warfarin 7.5 MG tablet ?Commonly known as: COUMADIN ?Take 1 tablet (7.5 mg total) by mouth daily. ?  ? ?  ? ?  ?  ? ? ?  ?Durable Medical Equipment  ?(From admission, onward)  ?  ? ? ?  ? ?  Start     Ordered  ? 06/03/21 1451  DME Walker  Once       ?Question Answer Comment  ?Walker: Without Wheels   ?Patient needs a walker to treat with the  following condition Weakness   ?  ? 06/03/21 1452  ? 05/29/21 1344  For home use only DME Walker rolling  Once       ?Question Answer Comment  ?Walker: With 5 Inch Wheels   ?Patient needs a walker to treat with the following condition General weakness   ?  ? 05/29/21 1345  ? ?  ?  ? ?  ? ? ?Disposition and follow-up:   ?Abigail Butler was discharged from [facility] in [place] condition.  At the hospital follow up visit please address: ? ?Atrial fibrillation with left atrial atrial appendage thrombus ?Heart rate difficult to control.  She was discharged with diltiazem 240 mg and digoxin 0.25 mg, monitor HR and need for further rate control.  INR goal 2-3.  She was given warfarin and fondaparinux prior to leaving hospital.  She will follow-up with cardiology on 4/14. ? ?Mitral stenosis ?Rheumatic mitral valve disease ?Patient will follow-up with cardiology.  Today sent case to cardiothoracic surgery, Dr. Cyndia Bent and patient has follow-up with him 4/24. ? ?Prediabetes ?A1c 5.9, not on any medications.  ? ?Hypertension ?HydroChlorothiazide was discontinued. ? ?Labs / imaging needed at time of follow-up: INR ? ?Pending labs/ test needing follow-up: none ? ?Follow-up Appointments: ? Follow-up Information   ? ?  Argentina Donovan, PA-C Follow up.   ?Specialty: Family Medicine ?Why: TIME : 11:10 am ?DATE: APRIL 06,2023 ?DOCTOR: McCLUNG, ANGELA ?LOCATION : [place]. [address] ?Contact information: ?[address] ?[address] ?[place] [address] ?[phone] ? ? ?  ?  ? ? [facility] Follow up.   ?Why: Rolling walker- [employer] will deliver to the room. ?Contact information: ?[address] ?[place] [address] ?[phone] ? ? ?  ?  ? ? Revankar, Reita Cliche, MD Follow up on 06/12/2021.   ?Specialty: Cardiology ?Why: At 3:20 pm for your cardiology follow up appointment, please arrive 15 min early. ?Contact information: ?[address] ?[address] ?[place] [address] ?[phone] ? ? ?   ?  ? ? [facility] Follow up on 06/05/2021.   ?Specialty: Cardiology ?Why: please arraive at 2:30pm for your blood work with coumadin clinic ?Contact information: ?[address] ?[place] ?[phone] ? ?  ?  ? ?  ?  ? ?  ? ? ?Hospital Course by problem list: ?New onset atrial flutter w/ variable AV block ?Mitral stenosis ?Left atrial appendage clot ?Patient presented with progressive chest tightness, nausea, vomiting and palpitations for the past two days. She presented to the [facility] center on 3/27 with initial EKG concerning for atrial flutter with heart rates in the 170s.  Repeat EKG in the ER showed atrial flutter with HR at 165, started on diltiazem gtt. Rates improved to low 100s. No recent infection, illness or surgery. Troponins flat. TSH within normal limits.  BNP 511.  Chest radiograph with signs of pulmonary edema.  No echocardiogram on file.  Left heart cath and coronary angiogram in 2019 completed for chest pain showed normal coronary anatomy and normal LV function, EF 55 to 65%. No history of congestive heart failure or overt signs or symptoms of heart failure on exam; no JVD, orthopnea, lower extremity edema, abdominal distention or shortness of breath.  With mitral stenosis atrial flutter and left atrial clot patient at high risk of thrombus.  Anticoagulation recommended with warfarin, DOACs not indicated in this case. 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 Cardiology was consulted and performed a TEE, which confirmed severe rheumatic mitral stenosis and also showed a large LAA thrombus, thus the patient was not able to be cardioverted at that time. Started on rate control with diltiazem, metoprolol, and digoxin--metoprolol was discontinued prior to discharge due to low HR/BP, continued to diltiazem and digoxin. INR goal 2-3.  She was bridged with fondaparinux.   Received 10 mg warfarin and fondaparinux prior to leaving hospital.  She will follow-up in [facility] internal medicine clinic to recheck INR 4/6.  She has follow-up with cardiology.  Her case was sent to cardiothoracic surgery for possible mitral valve replacement. ?  ?Hypertension ?Normotensive on admission.  History of hypertension previously on hydrochlorothiazide 12.5 mg daily. Patient has not taken this medication for several years. BP did become lower throughout hospitalization, as she was started on diltiazem and metoprolol.  ? ?History of CVA ?Previously on atorvastatin 40 mg and aspirin 81 mg for secondary prevention.  Patient has been off of these medications for several years. Last lipid profile was in 2013 showed a total cholesterol of 147 and LDL of 95. Recommend repeat lipid profile outpatient ? ?History of H. Pylori ?Epigastric tenderness ?Patient has a history of H. pylori, previously failed outpatient  treatment and was treated again in 2019.  Repeat testing was not completed after second treatment.  She endorsed epigastric pain on admission, which she attributes to needing to have a bowel movement.  We will continue to monitor and recommend outpatient testing for H. pylori if epigastric tenderness persists. Her pain improved throughout hospitalization.  ?  ?Prediabetes ?A1c 5.9, not on any medications.  ? ?Subjective: ? ?Discharge Exam:   ?BP 138/68   Pulse 89   Temp 98.1 ?F (36.7 ?C) (Oral)   Resp 14   Ht 5\' 5"  (1.651 m)   Wt 82.6 kg   SpO2 96%   BMI 30.29 kg/m?  ?Discharge exam:  ?Physical Exam ?HENT:  ?   Mouth/Throat:  ?   Mouth: Mucous membranes are moist.  ?Cardiovascular:  ?   Rate and Rhythm: Normal rate. Rhythm irregular.  ?   Pulses: Normal pulses.  ?   Heart sounds: Normal heart sounds.  ?Pulmonary:  ?   Effort: Pulmonary effort is normal.  ?   Breath sounds: Normal breath sounds.  ?Skin: ?   General: Skin is warm and dry.  ?Neurological:  ?   General: No focal deficit present.  ?    Mental Status: She is alert and oriented to person, place, and time. Mental status is at baseline.  ?  ? ?Pertinent Labs, Studies, and Procedures:  ? ?  Latest Ref Rng & Units 06/03/2021  ?  2:44 AM 06/02/2021  ?  2:59 AM

## 2021-06-03 NOTE — Care Management (Signed)
06-03-21 1308 [employer] medication assistance will expire on 06-04-21. [place] letter is on the shadow chart for the patient. If the patient discharges during the week, please send all new Rx's to [facility]. Rolling walker ordered via [employer].  ?

## 2021-06-03 NOTE — Progress Notes (Signed)
Advised by Dr. Sloan Leiter to hold Digoxin, Cardizem, and Metoprolol this AM due to orthostatics and heart rate.  ?

## 2021-06-03 NOTE — Progress Notes (Deleted)
Patient ID: Abigail Butler, female   DOB: 1965/05/02, 56 y.o.   MRN: [id number] ? ?After hospitalization 3/27-06/03/2021 ?

## 2021-06-03 NOTE — Progress Notes (Addendum)
? ?Progress Note ? ?Patient Name: Abigail Butler ?Date of Encounter: 06/03/2021 ? ?[facility] Cardiologist: Jenean Lindau, MD  ? ?Subjective  ? ?She was not discharged yesterday due to dizziness with low BP and bradycardia.  She states she is feeling well today.  ? ?Inpatient Medications  ?  ?Scheduled Meds: ? digoxin  0.25 mg Oral Daily  ? diltiazem  240 mg Oral Daily  ? metoprolol succinate  50 mg Oral Daily  ? pneumococcal 20-valent conjugate vaccine  0.5 mL Intramuscular Tomorrow-1000  ? senna-docusate  2 tablet Oral QHS  ? Warfarin - Pharmacist Dosing Inpatient   Does not apply [id number]  ? ?Continuous Infusions: ? sodium chloride Stopped (05/29/21 1824)  ? ?PRN Meds: ?acetaminophen, alum & mag hydroxide-simeth, ondansetron (ZOFRAN) IV  ? ?Vital Signs  ?  ?Vitals:  ? 06/02/21 2307 06/03/21 0430 06/03/21 [id number] 06/03/21 [id number]  ?BP: (!) 110/54 117/74 (!) 163/89   ?Pulse: 71 75    ?Resp: 14 18 14 16   ?Temp: 97.9 ?F (36.6 ?C) 98.2 ?F (36.8 ?C)  98.1 ?F (36.7 ?C)  ?TempSrc: Oral Oral  Oral  ?SpO2: 93% 93%    ?Weight:      ?Height:      ? ? ?Intake/Output Summary (Last 24 hours) at 06/03/2021 0856 ?Last data filed at 06/02/2021 2045 ?Gross per 24 hour  ?Intake 600 ml  ?Output --  ?Net 600 ml  ? ? ? ?  05/27/2021  ? 12:00 PM 12/08/2017  ?  2:08 PM 05/18/2017  ? 10:07 AM  ?Last 3 Weights  ?Weight (lbs) 182 lb 183 lb 9.6 oz 191 lb  ?Weight (kg) 82.555 kg 83.28 kg 86.637 kg  ?   ? ?Telemetry  ?  ?A fib with ventricular rate of 50-100, pauses less than 2 seconds noted - Personally Reviewed ? ?ECG  ?  ?No new EKG today - Personally Reviewed ? ?Physical Exam  ? ?GEN: No acute distress. Sitting in bed.  ?Neck: No JVD ?Cardiac: Irregularly irregular, no murmurs, rubs, or gallops.  ?Respiratory: Clear to auscultation bilaterally. On room air.  ?GI: Soft, nontender, non-distended  ?MS: No edema; No deformity. ?Neuro:  Alert and oriented to person/place/time, able to communicate needs, moving all extremity freely   ?Psych: Normal affect   ? ?Labs  ?  ?High Sensitivity Troponin:   ?Recent Labs  ?Lab 05/25/21 ?1532 05/25/21 ?1714  ?TROPONINIHS 12 13  ? ?   ?Chemistry ?Recent Labs  ?Lab 06/01/21 ?0150 06/02/21 ?0259 06/03/21 ?[id number]  ?NA 134* 138 137  ?K 5.0 4.6 4.2  ?CL 106 108 109  ?CO2 23 23 23   ?GLUCOSE 128* 116* 135*  ?BUN 7 13 14   ?CREATININE 0.70 0.82 0.71  ?CALCIUM 8.7* 8.8* 9.1  ?MG 2.3 2.1 1.9  ?GFRNONAA >60 >60 >60  ?ANIONGAP 5 7 5   ? ?  ?Lipids No results for input(s): CHOL, TRIG, HDL, LABVLDL, LDLCALC, CHOLHDL in the last 168 hours.  ?Hematology ?Recent Labs  ?Lab 06/01/21 ?0150 06/02/21 ?0259 06/03/21 ?[id number]  ?WBC 9.9 10.0 8.3  ?RBC 4.45 4.53 4.49  ?HGB 12.4 13.1 12.5  ?HCT 39.6 39.8 39.5  ?MCV 89.0 87.9 88.0  ?MCH 27.9 28.9 27.8  ?MCHC 31.3 32.9 31.6  ?RDW 13.2 12.9 12.9  ?PLT 260 276 271  ? ? ?Thyroid  ?No results for input(s): TSH, FREET4 in the last 168 hours. ?  ?BNP ?No results for input(s): BNP, PROBNP in the last 168 hours. ?  ?DDimer No results for input(s): DDIMER in  the last 168 hours.  ? ?Radiology  ?  ?No results found. ? ?Cardiac Studies  ? ?TEE 05/27/21: ? 1. Left ventricular ejection fraction, by estimation, is 50 to 55%. The  ?left ventricle has low normal function.  ? 2. Right ventricular systolic function is normal. The right ventricular  ?size is normal.  ? 3. Left atrial size was severely dilated. A left atrial/left atrial  ?appendage thrombus was detected.  ? 4. Right atrial size was mildly dilated.  ? 5. The aortic valve is tricuspid. Aortic valve regurgitation is mild. No  ?aortic stenosis is present.  ? 6. Tricuspid valve regurgitation is mild to moderate.  ? 7. A small pericardial effusion is present.  ? 8. The mitral valve is rheumatic. Mild mitral valve regurgitation. Severe  ?mitral stenosis. MG 44mmHg, MVA 1.5 cm^2 by PHT, MVA 1.1 cm^2 by  ?planimetry (measurement was made on 3DQ but images were not transferring  ?to [employer])  ?  ?  ?TTE 05/26/21: ? 1. Severe rheumatic mitral stenosis is present. MG 10.1 mmHG @ 95  bpm.  ?PHT difficult due to Afib, but PHT ~140 msec which equates to MVA of 1.56  ?cm2. Leaflets are mildly thickened with calcification of the PMVL tip.  ?Anterior leaflet is mobile but the  ?PMVL is restricted. There is mild MR. Would recommend TEE for better  ?characterization including MVA by direct 3D assessment. The mitral valve  ?is rheumatic. Mild mitral valve regurgitation. Severe mitral stenosis. The  ?mean mitral valve gradient is 10.1  ?mmHg with average heart rate of 95 bpm.  ? 2. Left ventricular ejection fraction, by estimation, is 55 to 60%. The  ?left ventricle has normal function. The left ventricle has no regional  ?wall motion abnormalities. Left ventricular diastolic function could not  ?be evaluated.  ? 3. Right ventricular systolic function is normal. The right ventricular  ?size is normal. There is mildly elevated pulmonary artery systolic  ?pressure. The estimated right ventricular systolic pressure is 99991111 mmHg.  ? 4. Left atrial size was severely dilated.  ? 5. Right atrial size was mildly dilated.  ? 6. The aortic valve is tricuspid. Aortic valve regurgitation is mild. No  ?aortic stenosis is present.  ? 7. The inferior vena cava is normal in size with <50% respiratory  ?variability, suggesting right atrial pressure of 8 mmHg.  ? ?Patient Profile  ?   ?56 y.o. female with PMH of HTN, latent TB s/p treatment, GERD, ischemic CVA, who is admitted for chest pain, palpation, fatigue, nonbloody vomiting,  found in new onset of atrial fibrillation/flutter with rapid ventricular rate. Cardiology is consulted and following  since 05/26/21 for AFib/Flutter. Cardiac cath from 2019 showed normal coronary anatomy. TEE from 05/27/21 showed LVEF 50-55%, normal RV, severely dilated LA, LAA thrombus, mitral valve is rheumatic. Mild MR. Severe MS. Cardioversion was initially planned and cancelled subsequently due to LAA thrombus. A fib RVR rate control has been difficult with meds, currently on diltiazem,  digoxin, and metoprolol. Anticoagulation regimen completed by fondaparinux bridge to coumadin.  ? ? ? ?Assessment & Plan  ?  ?Atrial flutter/fibrillation  ?- New onset 05/26/21, with intermittent heart palpitation ?- Rate has been difficult to control with meds, unable cardioversion due to LAA thrombus  ?- current rate controlling regimen: digoxin 0.25mg  daily, diltiazem CX 240mg  daily, metoprolol XL 50mg  daily (reduced since 4/3 due to symptomatic bradycardia) ?- anticoagulation regimen: fondaparinux (patient prefer pork free products) bridge to coumadin, INR 1.9, goal INR 2-3-  follow up with cardiology has been arranged on 06/12/21 and coumadin clinic for INR check on 06/05/21 ?- consider digoxin level check at next office visit  ? ?Large left atrial appendage thrombus  ?- Noted on TEE on 05/27/21  ?- Anticoagulation regimen: fondaparinux bridge to coumadin, INR 1.9 ?- Hgb 12.5, no bleeding at this time ? ?Rheumatic mitral valve disease ?Severe MS ?- Noted on TEE on 05/27/21  ?- MG 22mmHg, MVA 1.5 cm^2 by PHT, MVA 1.1 cm^2 by  ?- Follow up with CV surgery Dr. Cyndia Bent 4/24 ?- Anticoagulation regimen: fondaparinux bridge to coumadin, INR 1.9 ? ? ? ?For questions or updates, please contact [place] ?Please consult [email or web address] for contact info under  ?   ?Signed, ?Margie Billet, NP  ?06/03/2021, 8:56 AM    ? ? ?Patient seen and examined, note reviewed with the signed Advanced Practice Provider. I personally reviewed laboratory data, imaging studies and relevant notes. I independently examined the patient and formulated the important aspects of the plan. I have personally discussed the plan with the patient and/or family. Comments or changes to the note/plan are indicated below. ? ?Continue with bridge to coumadin - INR goal 2-3. She will follow with our outpatient coumadin clinic. ?Some rate control agent held due to bradycardia - discussed with team will restart Cardizem.  ?Plan for close cardiology follow up.  ?Suspect  volume depletion may be contributing to orthostasis. ? ? ? ?Berniece Salines DO, MS FACC ?Attending Cardiologist ?[facility]  ?[id number] ?[place], [place] [address] ?(336) [phone] ?We

## 2021-06-03 NOTE — Progress Notes (Signed)
Patients daughter given discharge instructions and stated understanding.  Picked medication from [facility] and daughter has it with her for discharge. ?

## 2021-06-04 ENCOUNTER — Inpatient Hospital Stay: Payer: Medicaid Other | Admitting: Physician Assistant

## 2021-06-04 ENCOUNTER — Encounter: Payer: Self-pay | Admitting: Internal Medicine

## 2021-06-04 ENCOUNTER — Other Ambulatory Visit (HOSPITAL_COMMUNITY): Payer: Self-pay

## 2021-06-04 ENCOUNTER — Ambulatory Visit (INDEPENDENT_AMBULATORY_CARE_PROVIDER_SITE_OTHER): Payer: Self-pay | Admitting: Internal Medicine

## 2021-06-04 ENCOUNTER — Other Ambulatory Visit: Payer: Self-pay

## 2021-06-04 VITALS — BP 133/62 | HR 80 | Temp 98.5°F | Wt 178.7 lb

## 2021-06-04 DIAGNOSIS — Z1231 Encounter for screening mammogram for malignant neoplasm of breast: Secondary | ICD-10-CM

## 2021-06-04 DIAGNOSIS — N6320 Unspecified lump in the left breast, unspecified quadrant: Secondary | ICD-10-CM

## 2021-06-04 DIAGNOSIS — Z8673 Personal history of transient ischemic attack (TIA), and cerebral infarction without residual deficits: Secondary | ICD-10-CM

## 2021-06-04 DIAGNOSIS — I513 Intracardiac thrombosis, not elsewhere classified: Secondary | ICD-10-CM

## 2021-06-04 DIAGNOSIS — Z124 Encounter for screening for malignant neoplasm of cervix: Secondary | ICD-10-CM

## 2021-06-04 DIAGNOSIS — Z1239 Encounter for other screening for malignant neoplasm of breast: Secondary | ICD-10-CM | POA: Insufficient documentation

## 2021-06-04 HISTORY — DX: Intracardiac thrombosis, not elsewhere classified: I51.3

## 2021-06-04 HISTORY — DX: Encounter for screening for malignant neoplasm of cervix: Z12.4

## 2021-06-04 LAB — POCT INR: INR: 2.7 (ref 2.0–3.0)

## 2021-06-04 MED ORDER — ASPIRIN EC 81 MG PO TBEC
81.0000 mg | DELAYED_RELEASE_TABLET | Freq: Every day | ORAL | 2 refills | Status: DC
  Filled 2021-06-04: qty 150, 150d supply, fill #0

## 2021-06-04 MED ORDER — ATORVASTATIN CALCIUM 40 MG PO TABS: 40.0000 mg | ORAL_TABLET | Freq: Every day | ORAL | 3 refills | Status: DC

## 2021-06-04 MED ORDER — ASPIRIN 81 MG PO TBEC
81.0000 mg | DELAYED_RELEASE_TABLET | Freq: Every day | ORAL | 2 refills | Status: AC
  Filled 2021-06-04: qty 150, 150d supply, fill #0

## 2021-06-04 NOTE — Telephone Encounter (Signed)
atorvastatin (LIPITOR) 40 MG tablet ?[facility] ?

## 2021-06-04 NOTE — Assessment & Plan Note (Addendum)
Review shows patient seen at breast center in 2016 and found to have two small lesions within the lateral portion of the left breast, likely representing fat necrosis. Further evaluation with ultrasound  ?was recommended. This was not completed.  ?- Will place order for diagnostic mammogram at the breast center ?
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
## 2021-06-04 NOTE — Assessment & Plan Note (Signed)
Patient has stopped taking atorvastatin and aspirin, she does not know why.  I discussed this was for stroke prevention and went over with her daughter as well.  ? ?Assessment/Plan: History of right leg stroke in 2015, continue atorvastatin 40 mg and aspirin 81 mg daily for secondary prevention ? ?

## 2021-06-04 NOTE — Assessment & Plan Note (Signed)
?  Patient is here for hospital follow-up.  Presented to hospital in atrial fibrillation found to have left atrial appendage thrombus.  She was started on warfarin with INR goal 2-3.  She was scheduled with [facility] and cardiology for Coumadin monitoring.  Discussed with patient and she will be following in our Coumadin clinic.  This is being arranged and cardiology has been notified by RN.  INR 2.7 today, our Coumadin clinic is closed next Monday for Easter holiday.  Will have patient scheduled for office visit next week for INR. ?- Continue current dose of Coumadin, 7.5 mg daily.  ?

## 2021-06-04 NOTE — Assessment & Plan Note (Signed)
No pap smear on file and patient can not say when her last pap smear was done. She is uninsured. I have placed order for referral to OB/GYN and our front office is going to establish patient with financial assistant program to help arrange this .  ?

## 2021-06-04 NOTE — Patient Instructions (Signed)
Thank you for trusting me with your care. To recap, today we discussed the following: ? ?We will schedule you in our Warfarin Clinic for continued monitoring.  ? ?You had a stroke in 2015. You need to take atorvastatin and Asprin 81 mg daily. I sent these to our [place].  ? ?We have place orders to help you have routine breast and cervical cancer screening. ? ? ?

## 2021-06-05 ENCOUNTER — Other Ambulatory Visit (HOSPITAL_COMMUNITY): Payer: Self-pay

## 2021-06-05 LAB — LIPID PANEL
Chol/HDL Ratio: 5.5 ratio — ABNORMAL HIGH (ref 0.0–4.4)
Cholesterol, Total: 177 mg/dL (ref 100–199)
HDL: 32 mg/dL — ABNORMAL LOW (ref >39)
LDL Chol Calc (NIH): 114 mg/dL — ABNORMAL HIGH (ref 0–99)
Triglycerides: 173 mg/dL — ABNORMAL HIGH (ref 0–149)
VLDL Cholesterol Cal: 31 mg/dL (ref 5–40)

## 2021-06-05 NOTE — Progress Notes (Signed)
Internal Medicine Clinic Attending  Case discussed with Dr. Steen  At the time of the visit.  We reviewed the resident's history and exam and pertinent patient test results.  I agree with the assessment, diagnosis, and plan of care documented in the resident's note.  

## 2021-06-09 ENCOUNTER — Other Ambulatory Visit (HOSPITAL_COMMUNITY): Payer: Self-pay

## 2021-06-09 MED ORDER — ATORVASTATIN CALCIUM 40 MG PO TABS
40.0000 mg | ORAL_TABLET | Freq: Every day | ORAL | 3 refills | Status: DC
  Filled 2021-06-09: qty 30, 30d supply, fill #0
  Filled 2021-07-13: qty 30, 30d supply, fill #1
  Filled 2021-08-24: qty 30, 30d supply, fill #2
  Filled 2021-09-28: qty 30, 30d supply, fill #3
  Filled 2021-10-22: qty 30, 30d supply, fill #4
  Filled 2021-11-17: qty 30, 30d supply, fill #5
  Filled 2021-12-14: qty 30, 30d supply, fill #6
  Filled 2022-01-15: qty 30, 30d supply, fill #7

## 2021-06-11 DIAGNOSIS — R200 Anesthesia of skin: Secondary | ICD-10-CM

## 2021-06-11 DIAGNOSIS — R519 Headache, unspecified: Secondary | ICD-10-CM | POA: Insufficient documentation

## 2021-06-11 HISTORY — DX: Anesthesia of skin: R20.0

## 2021-06-12 ENCOUNTER — Encounter: Payer: Self-pay | Admitting: Cardiology

## 2021-06-12 ENCOUNTER — Ambulatory Visit (INDEPENDENT_AMBULATORY_CARE_PROVIDER_SITE_OTHER): Payer: Self-pay | Admitting: Cardiology

## 2021-06-12 VITALS — BP 124/76 | HR 74 | Ht 64.0 in | Wt 181.1 lb

## 2021-06-12 DIAGNOSIS — I052 Rheumatic mitral stenosis with insufficiency: Secondary | ICD-10-CM

## 2021-06-12 DIAGNOSIS — I10 Essential (primary) hypertension: Secondary | ICD-10-CM

## 2021-06-12 DIAGNOSIS — I513 Intracardiac thrombosis, not elsewhere classified: Secondary | ICD-10-CM

## 2021-06-12 DIAGNOSIS — I4891 Unspecified atrial fibrillation: Secondary | ICD-10-CM

## 2021-06-12 DIAGNOSIS — Z8673 Personal history of transient ischemic attack (TIA), and cerebral infarction without residual deficits: Secondary | ICD-10-CM

## 2021-06-12 NOTE — Patient Instructions (Addendum)
????? ????? ??? ????? ?? ????? ?????? ??????? ??? ?????????. ???? ?????? ??? ????? ??????? ??????? ??????? ?? ?????. ?* ??? ??? ????? ??? ????? ??? ????? ????? ?????? ?? ??? ????? ?????? ? ???? ??????? ????????? ?????? ?? * ? ? ???? ??????: ???? ?? ??? ???? ???? ???? ??????? (?????? ????) ?????? ????? ????? ????????? ?????? ?????? ? ??????? ?????? ??? ?? ????: ? ????? MyChart (??? ??? ???? MyChart) ?? ? ???? ????? ?? ?????? ???? ??? ???? ?? ?????? ???? ??? ????? ?? ??? ????? ??? ????? ????? ? ???? ???? ?? ??????? ???????. ? ? ???????? ????????: ???? ?? ??? ?taelimat aldawa'i: ?yusi tabibuk bi'an tastamira fi tanawul 'adwiatik alhaliat hasab altawjihati. yurjaa alrujue 'iilaa qayimat al'adwiat alhaliat almuqadamat lak alyawma. ?* 'iidha kunt bihajat 'iilaa 'iieadat sarf 'adwiat alqalb alkhasat bik qabl maweidik altaali , yurjaa alaitisal bialsaydaliat alkhasat bik * ?eamal mukhtabari: ?'amr la shay' ?'iidha kanat ladayk mukhtabarat (tahalil aldum) mashubat alyawm wakanat aikhtibaratuk tabieiatan tmaman , fasatatalaqaa natayijak faqat ean tariqi: ? risalat MyChart ('iidha kan ladayk MyChart) 'aw ? nuskhat waraqiat fi albarid ?'iidha kan ladayk 'ayu aikhtibar maemal ghayr tabieiin 'aw kunaa bihajat 'iilaa taghyir eilajik , fasawf natasil bik limurajaeat alnatayija. ?'iijra'at alaikhtibari: ?'amr la shay' ? ? ?: ??? [facility] ? ??? ?????????? ?????? ?? ????????. ???? ?? ?????? ???????? ??????? ?????? ????????? ????? ? ???? ?????? ??? ????? ????? ?????? ???????. ???? ??? ??????? ??? ???? ????? ??????? (??????) ?????? ???????? ???????? (APPs - ?????? ??????? ????????? ?????????) ????? ?????? ???? ??????? ???????? ???? ??????? ? ????? ???????. ? ????? ???????? ?? ????? ?????? ??????? "MyChart". ??? ????? ??????? ??????? ?? ???? ?? ??? ???????. ?????? MyChart ??????? ?? ?????? ?? ??? ?????? ???????? (??????? ?? ???). ???? ?????? ??? ????? ??????? / ???????? ? ???????? ???????? ? ????????? ??????? ? ??? ??? ???. ????  ????? ????? ??????? ??? ??????? ??? ???? ?????? ????? ??. ??????? ?????? ??? ?? ????? ???? ???????? MyChart ? ????? ??? [email or web address]. ? ?????? ??????: ?6 ????) ? ?????? ????? ??????: ?????? ? ?????: ?????? ???????? ? ??????? ?? ???? ? ? ???????? ???? ???? ????? ?mutabaeatu: ?fi [facility] ? 'ant wahtiajatuk alsihiyat hi 'awlawiatana. Abigail Butler' min muhamatina almustamirat litazwidik birieayat astithnayiyat lilqalb , qumna bi'iinsha' firaq rieayat mukhasasat limuqadimi alrieayati. tashmal firaq alrieayat hadhih tabib alqalb al'asasii (altabibi) wamuqadimi almumarasat almutaqadima (APPs - musaeidi al'atibaa' walmumaridat almumarisina) aladhin yaemalun mean litazwidik bialrieayat alati tahtajuha , eindama tahtajuha. ?nusi bialtasjil fi bawaabat almardaa almusamaa "MyChart". yatimu tawfir maelumat altasjil fi mulakhas ma baed alziyarati. yustakhdam MyChart liltawasul mae almardaa min 'ajl ziarat aiftiradia (altatbib ean bueda). yumkin lilmardaa eard natayij almukhtabar / alaikhtibar , wamulahazat alaijtimae , walmawaeid alqadimat , wama 'iilaa dhalika. yumkin aydan 'iirsal alrasayil ghayr aleajilat 'iilaa muzawid alkhidmat alkhasi bika. ?limaerifat almazid hawl ma yumkinuk fieluh biastikhdam MyChart , aintaqal 'iilaa [email or web address]. ?maweiduk alqadimi: ?6 ashhari) ?tansiq maweidik alqadimi: ?shakhsiana ?muzawad: ?Engineer, civil (consulting) , dukturah fi altibi ?taelimat 'ukhraa ?ghayr mutawafir ? ? ? ? ?Medication Instructions:  ?Your physician recommends that you continue on your current medications as directed. Please refer to the Current Medication list given to you today.  ?*If you need a refill on your cardiac medications before your next appointment, please call your pharmacy* ? ? ?Lab Work: ?None ordered ?If you have labs (blood work) drawn today and your tests are completely normal, you will receive your results only by: ?MyChart Message (if you have MyChart) OR ?A paper copy in the mail ?If you have any  lab test that is abnormal or we need to change  your treatment, we will call you to review the results. ? ? ?Testing/Procedures: ?None ordered ? ? ?Follow-Up: ?At [facility], you and your health needs are our priority.  As part of our continuing mission to provide you with exceptional heart care, we have created designated Provider Care Teams.  These Care Teams include your primary Cardiologist (physician) and Advanced Practice Providers (APPs -  Physician Assistants and Nurse Practitioners) who all work together to provide you with the care you need, when you need it. ? ?We recommend signing up for the patient portal called "MyChart".  Sign up information is provided on this After Visit Summary.  MyChart is used to connect with patients for Virtual Visits (Telemedicine).  Patients are able to view lab/test results, encounter notes, upcoming appointments, etc.  Non-urgent messages can be sent to your provider as well.   ?To learn more about what you can do with MyChart, go to [email or web address].   ? ?Your next appointment:   ?6 month(s) ? ?The format for your next appointment:   ?In Person ? ?Provider:   ?Jyl Heinz, MD ? ? ?Other Instructions ?NA ? ?
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
## 2021-06-14 ENCOUNTER — Other Ambulatory Visit: Payer: Self-pay

## 2021-06-14 ENCOUNTER — Encounter (HOSPITAL_COMMUNITY): Payer: Self-pay | Admitting: Emergency Medicine

## 2021-06-14 ENCOUNTER — Emergency Department (HOSPITAL_COMMUNITY)
Admission: EM | Admit: 2021-06-14 | Discharge: 2021-06-14 | Disposition: A | Discharge status: Home / Self Care | Payer: Medicaid Other | Attending: Emergency Medicine | Admitting: Emergency Medicine

## 2021-06-14 DIAGNOSIS — Z7982 Long term (current) use of aspirin: Secondary | ICD-10-CM | POA: Insufficient documentation

## 2021-06-14 DIAGNOSIS — T45515A Adverse effect of anticoagulants, initial encounter: Secondary | ICD-10-CM

## 2021-06-14 DIAGNOSIS — Z7901 Long term (current) use of anticoagulants: Secondary | ICD-10-CM | POA: Insufficient documentation

## 2021-06-14 DIAGNOSIS — K921 Melena: Secondary | ICD-10-CM | POA: Insufficient documentation

## 2021-06-14 DIAGNOSIS — R791 Abnormal coagulation profile: Secondary | ICD-10-CM | POA: Insufficient documentation

## 2021-06-14 DIAGNOSIS — R319 Hematuria, unspecified: Secondary | ICD-10-CM | POA: Insufficient documentation

## 2021-06-14 DIAGNOSIS — I4891 Unspecified atrial fibrillation: Secondary | ICD-10-CM | POA: Insufficient documentation

## 2021-06-14 DIAGNOSIS — I10 Essential (primary) hypertension: Secondary | ICD-10-CM | POA: Insufficient documentation

## 2021-06-14 DIAGNOSIS — Z79899 Other long term (current) drug therapy: Secondary | ICD-10-CM | POA: Insufficient documentation

## 2021-06-14 LAB — URINALYSIS, ROUTINE W REFLEX MICROSCOPIC
Bilirubin Urine: NEGATIVE
Glucose, UA: NEGATIVE mg/dL
Ketones, ur: NEGATIVE mg/dL
Leukocytes,Ua: NEGATIVE
Nitrite: NEGATIVE
Protein, ur: 30 mg/dL — AB
RBC / HPF: >50 RBC/hpf — ABNORMAL HIGH (ref 0–5)
Specific Gravity, Urine: 1.012 (ref 1.005–1.030)
pH: 5 (ref 5.0–8.0)

## 2021-06-14 LAB — CBC
HCT: 39.1 % (ref 36.0–46.0)
Hemoglobin: 12.6 g/dL (ref 12.0–15.0)
MCH: 28.3 pg (ref 26.0–34.0)
MCHC: 32.2 g/dL (ref 30.0–36.0)
MCV: 87.9 fL (ref 80.0–100.0)
Platelets: 331 10*3/uL (ref 150–400)
RBC: 4.45 MIL/uL (ref 3.87–5.11)
RDW: 12.6 % (ref 11.5–15.5)
WBC: 7.1 10*3/uL (ref 4.0–10.5)
nRBC: 0 % (ref 0.0–0.2)

## 2021-06-14 LAB — COMPREHENSIVE METABOLIC PANEL
ALT: 31 U/L (ref 0–44)
AST: 21 U/L (ref 15–41)
Albumin: 3.5 g/dL (ref 3.5–5.0)
Alkaline Phosphatase: 92 U/L (ref 38–126)
Anion gap: 6 (ref 5–15)
BUN: 6 mg/dL (ref 6–20)
CO2: 27 mmol/L (ref 22–32)
Calcium: 9.2 mg/dL (ref 8.9–10.3)
Chloride: 107 mmol/L (ref 98–111)
Creatinine, Ser: 0.73 mg/dL (ref 0.44–1.00)
GFR, Estimated: >60 mL/min (ref >60)
Glucose, Bld: 131 mg/dL — ABNORMAL HIGH (ref 70–99)
Potassium: 4.1 mmol/L (ref 3.5–5.1)
Sodium: 140 mmol/L (ref 135–145)
Total Bilirubin: 0.3 mg/dL (ref 0.3–1.2)
Total Protein: 7.7 g/dL (ref 6.5–8.1)

## 2021-06-14 LAB — POC OCCULT BLOOD, ED
Fecal Occult Bld: NEGATIVE
Fecal Occult Bld: NEGATIVE

## 2021-06-14 LAB — PROTIME-INR
INR: 5.3 (ref 0.8–1.2)
Prothrombin Time: 48.5 seconds — ABNORMAL HIGH (ref 11.4–15.2)

## 2021-06-14 LAB — LIPASE, BLOOD: Lipase: 36 U/L (ref 11–51)

## 2021-06-14 MED ORDER — PHYTONADIONE 5 MG PO TABS
2.5000 mg | ORAL_TABLET | Freq: Once | ORAL | Status: AC
  Administered 2021-06-14: 2.5 mg via ORAL
  Filled 2021-06-14: qty 1

## 2021-06-14 NOTE — ED Provider Notes (Signed)
?[facility] ?Provider Note ? ? ?CSN: [id number] ?Arrival date & time: 06/14/21  1022 ? ?  ? ?History ? ?Chief Complaint  ?Patient presents with  ? Abdominal Pain  ? Hematuria  ? Blood In Stools  ? ? ?Abigail Butler is a 56 y.o. female.  Patient complains of blood in stool and urine that began yesterday.  Patient began taking Coumadin on Thursday.  Patient is on anticoagulation, unclear if due to history of thrombus, history of stroke, ordered atrial fibrillation.  Complaints of bleeding began the day after she began taking the Coumadin.  Neck scheduled Coumadin level check is on Monday the 24th.  Patient denies chest pain, denies shortness of breath.  Complains of mild generalized abdominal pain felt when urinating.  PMH significant for GERD, hypertension, atrial fibrillation with RVR, mitral stenosis, history of CVA ? ?HPI ? ?  ? ?Home Medications ?Prior to Admission medications   ?Medication Sig Start Date End Date Taking? Authorizing Provider  ?aspirin 81 MG EC tablet Take 1 tablet (81 mg total) by mouth daily. Swallow whole. 06/04/21   Madalyn Rob, MD  ?atorvastatin (LIPITOR) 40 MG tablet Take 1 tablet (40 mg total) by mouth daily. 06/09/21   Madalyn Rob, MD  ?digoxin (LANOXIN) 0.25 MG tablet Take 1 tablet (0.25 mg total) by mouth daily. 06/03/21   Masters, Joellen [place], DO  ?diltiazem (CARDIZEM CD) 240 MG 24 hr capsule Take 1 capsule (240 mg total) by mouth daily. 06/04/21   Masters, Joellen [place], DO  ?warfarin (COUMADIN) 7.5 MG tablet Take 1 tablet (7.5 mg total) by mouth daily. 06/03/21   Masters, Joellen [place], DO  ?   ? ?Allergies    ?Pork-derived products   ? ?Review of Systems   ?Review of Systems  ?Constitutional:  Negative for fever.  ?Respiratory:  Negative for shortness of breath.   ?Cardiovascular:  Negative for chest pain.  ?Gastrointestinal:  Positive for abdominal pain (Mild generalized pain) and blood in stool.  ?Genitourinary:  Positive for hematuria. Negative for vaginal bleeding.   ?Skin:  Negative for pallor.  ?Neurological:  Negative for weakness.  ? ?Physical Exam ?Updated Vital Signs ?BP 120/90   Pulse 70   Temp 99.3 ?F (37.4 ?C) (Oral)   Resp 17   SpO2 100%  ?Physical Exam ?Vitals and nursing note reviewed. Exam conducted with a chaperone present.  ?Constitutional:   ?   General: She is not in acute distress. ?HENT:  ?   Head: Normocephalic and atraumatic.  ?Cardiovascular:  ?   Rate and Rhythm: Normal rate and regular rhythm.  ?   Heart sounds: Normal heart sounds.  ?Pulmonary:  ?   Effort: Pulmonary effort is normal.  ?   Breath sounds: Normal breath sounds.  ?Abdominal:  ?   General: Abdomen is flat. There is no distension.  ?   Palpations: Abdomen is soft.  ?   Tenderness: There is no abdominal tenderness (No tenderness to palpation).  ?Genitourinary: ?   Rectum: Normal. Guaiac result negative. No mass.  ?Skin: ?   General: Skin is warm and dry.  ?Neurological:  ?   Mental Status: She is alert.  ? ? ?ED Results / Procedures / Treatments   ?Labs ?(all labs ordered are listed, but only abnormal results are displayed) ?Labs Reviewed  ?COMPREHENSIVE METABOLIC PANEL - Abnormal; Notable for the following components:  ?    Result Value  ? Glucose, Bld 131 (*)   ? All other components within normal limits  ?  URINALYSIS, ROUTINE W REFLEX MICROSCOPIC - Abnormal; Notable for the following components:  ? APPearance HAZY (*)   ? Hgb urine dipstick LARGE (*)   ? Protein, ur 30 (*)   ? RBC / HPF >50 (*)   ? Bacteria, UA RARE (*)   ? All other components within normal limits  ?PROTIME-INR - Abnormal; Notable for the following components:  ? Prothrombin Time 48.5 (*)   ? INR 5.3 (*)   ? All other components within normal limits  ?LIPASE, BLOOD  ?CBC  ?POC OCCULT BLOOD, ED  ?POC OCCULT BLOOD, ED  ? ? ?EKG ?None ? ?Radiology ?No results found. ? ?Procedures ?Procedures  ? ? ?Medications Ordered in ED ?Medications  ?phytonadione (VITAMIN K) tablet 2.5 mg (has no administration in time range)  ? ? ?ED  Course/ Medical Decision Making/ A&P ?  ?                        ?Medical Decision Making ?Amount and/or Complexity of Data Reviewed ?Labs: ordered. ? ? ?This patient presents to the ED for concern of rectal bleed and hematuria, this involves an extensive number of treatment options, and is a complaint that carries with it a high risk of complications and morbidity.  The differential diagnosis includes high Coumadin dose, GI bleed, UTI, and others ? ? ?Co morbidities that complicate the patient evaluation ? ?History of atrial fib, chronic anticoagulation ? ? ?Additional history obtained: ? ?Additional history obtained from patient's friend ?External records from outside source obtained and reviewed including office notes from April 14 and April 6 discussing hypertension and left atrial thrombus ? ? ?Lab Tests: ? ?I Ordered, and personally interpreted labs.  The pertinent results include: INR 5.3, prothrombin time 48.5, rare bacteria and no nitrites on urinalysis, negative fecal occult blood test ? ?Problem List / ED Course / Critical interventions / Medication management ? ? ?I ordered medication including vitamin K for warfarin reversal ?Reevaluation of the patient after these medicines showed that the patient improved ?I have reviewed the patients home medicines and have made adjustments as needed ? ? ?Test / Admission - Considered: ? ?The patient was FOBT negative. There was hematuria noted on urinalysis but not evidence of UTI.  The patient had her coumadin started on Thursday. The person accompanying the patient, who also provided translation, states that they were concerned about a high dose and may have to adjust levels. Based on the high PT/INR, I believe reversal of coumadin is best at this time. There is no indication for admission. The patient has mild abdominal pain with no sign of infection and no indication for imaging. I believe she can discharge home and follow up with the coumadin clinic tomorrow.   ? ?Final Clinical Impression(s) / ED Diagnoses ?Final diagnoses:  ?Blood in stool  ?Hematuria, unspecified type  ?Bleeding on Coumadin  ?INR (international normal ratio) abnormal  ? ? ?Rx / DC Orders ?ED Discharge Orders   ? ? None  ? ?  ? ? ?  ?Dorothyann Peng, PA-C ?06/14/21 1432 ? ?  ?Godfrey Pick, MD ?06/15/21 1912 ? ?

## 2021-06-14 NOTE — ED Provider Triage Note (Signed)
Emergency Medicine Provider Triage Evaluation Note ? ?Abigail Butler , a 56 y.o. female  was evaluated in triage.  Patient reports he started taking atorvastatin 2 days ago this was followed by abdominal pain a diffuse aching sensation worsened with palpation no alleviating factors.  Shortly afterwards patient noticed blood in her stool and in her urine.  Patient is on warfarin. ? ?Review of Systems  ?Positive: Abdominal pain, hematuria, hematochezia ?Negative: Fever, chills, fall, injury, chest pain or any additional concerns ? ?Physical Exam  ?BP 102/83   Pulse 87   Temp 99.3 ?F (37.4 ?C) (Oral)   Resp 18   SpO2 99%  ?Gen:   Awake, no distress   ?Resp:  Normal effort  ?MSK:   Moves extremities without difficulty  ?Other:  Diffuse abdominal TTP without guarding or peritoneal signs ? ?Medical Decision Making  ?Medically screening exam initiated at 10:46 AM.  Appropriate orders placed.  Abigail Butler was informed that the remainder of the evaluation will be completed by another provider, this initial triage assessment does not replace that evaluation, and the importance of remaining in the ED until their evaluation is complete. ? ?Translation and additional history provided by patient's daughter at bedside.  Abigail Butler not supported by video interpreter. ? ?Note: Portions of this report may have been transcribed using voice recognition software. Every effort was made to ensure accuracy; however, inadvertent computerized transcription errors may still be present. ? ?  ?Bill Salinas, PA-C ?06/14/21 1054 ? ?

## 2021-06-14 NOTE — Discharge Instructions (Addendum)
You were seen today for bleeding while on Coumadin.  You are given a medication to help reverse the Coumadin effects.  I recommend follow-up tomorrow if possible with the Coumadin clinic to check your levels for possible medication adjustment.  Return to the emergency department if you develop life threats. Translated to below ?Anjeun katingal ayeuna kanggo perdarahan bari di Coumadin.  Anjeun dib?r? obat pikeun ngabantosan ngabalikeun ?p?k Coumadin.  Abdi nyarankeun nurutan ?njing upami tiasa nganggo klinik Coumadin pikeun mariksa tingkat anjeun pikeun panyesuaian pangobatan.  Balik deui ka jabatan darurat upami anjeun ngembangkeun [place]. ?

## 2021-06-14 NOTE — ED Notes (Signed)
Abigail Butler, [place] notified of INR 5.3 ?

## 2021-06-14 NOTE — ED Triage Notes (Signed)
Family states pt was started on Coumadin a few weeks ago.  Reports hematuria and blood in stool since yesterday.  Reports generalized abd pain with urination. ?

## 2021-06-15 ENCOUNTER — Ambulatory Visit (INDEPENDENT_AMBULATORY_CARE_PROVIDER_SITE_OTHER): Payer: Self-pay | Admitting: Internal Medicine

## 2021-06-15 ENCOUNTER — Encounter: Payer: Self-pay | Admitting: Internal Medicine

## 2021-06-15 ENCOUNTER — Ambulatory Visit (INDEPENDENT_AMBULATORY_CARE_PROVIDER_SITE_OTHER): Payer: Self-pay | Admitting: Pharmacist

## 2021-06-15 VITALS — BP 123/100 | HR 74 | Temp 98.2°F

## 2021-06-15 DIAGNOSIS — I513 Intracardiac thrombosis, not elsewhere classified: Secondary | ICD-10-CM

## 2021-06-15 DIAGNOSIS — Z8673 Personal history of transient ischemic attack (TIA), and cerebral infarction without residual deficits: Secondary | ICD-10-CM

## 2021-06-15 DIAGNOSIS — R310 Gross hematuria: Secondary | ICD-10-CM

## 2021-06-15 HISTORY — DX: Gross hematuria: R31.0

## 2021-06-15 LAB — POCT INR: INR: 2.4 (ref 2.0–3.0)

## 2021-06-15 NOTE — Patient Instructions (Signed)
1. Gross hematuria ?- POCT INR ?- If not resolving we will send you for further imaging with CT abdomen and pelvis  ? ?
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
## 2021-06-15 NOTE — Patient Instructions (Signed)
Patient instructed to take medications as defined in the Anti-coagulation Track section of this encounter.  ?Patient instructed to take today's dose.  ?Patient instructed to take one (1) of your 7.5mg  strength yellow warfarin tablets on Monday, Tuesday, Wednesday, Friday and Saturday. On Thursday (20-APR-23) and Sunday (23-APR-23) take only 1/2 of your tablet. ?Patient verbalized understanding of these instructions.   ?

## 2021-06-15 NOTE — Assessment & Plan Note (Signed)
Patient presented to ED yesterday with gross hematuria and had supertherapeutic INR on Coumdin 7.5 mg daily. Given Vit K in ED. She held Coumadin on 4/15 and 4/16. She has not history of hematuria. No history of kidney stones. She had some generalized abdominal pain yesterday, but no focal pain to localize symptoms. Reviewed workup in ED and did not suggest UTI, but did show RBC's. Given first occurrence plan to see if this resolves with correction of INR.Patient is going to be seen by Dr.Groce. She will follow with him in our Warfarin Clinic. If patients hematuria does not resolve I asked her to let us know. We will send her for CT abdomen and pelvis.  ?

## 2021-06-19 NOTE — Progress Notes (Signed)
Internal Medicine Clinic Attending  Case discussed with Dr. Steen  At the time of the visit.  We reviewed the resident's history and exam and pertinent patient test results.  I agree with the assessment, diagnosis, and plan of care documented in the resident's note.  

## 2021-06-19 NOTE — Progress Notes (Signed)
INTERNAL MEDICINE TEACHING ATTENDING ADDENDUM   I agree with pharmacy recommendations as outlined in their note.   Tamzin Bertling, MD  

## 2021-06-22 ENCOUNTER — Other Ambulatory Visit: Payer: Self-pay | Admitting: *Deleted

## 2021-06-22 ENCOUNTER — Institutional Professional Consult (permissible substitution) (INDEPENDENT_AMBULATORY_CARE_PROVIDER_SITE_OTHER): Payer: Self-pay | Admitting: Surgery

## 2021-06-22 ENCOUNTER — Ambulatory Visit (INDEPENDENT_AMBULATORY_CARE_PROVIDER_SITE_OTHER): Payer: Self-pay | Admitting: Pharmacist

## 2021-06-22 ENCOUNTER — Telehealth: Payer: Self-pay

## 2021-06-22 ENCOUNTER — Encounter: Payer: Self-pay | Admitting: Surgery

## 2021-06-22 VITALS — BP 163/78 | HR 89 | Resp 20 | Ht 64.0 in | Wt 180.8 lb

## 2021-06-22 DIAGNOSIS — I4891 Unspecified atrial fibrillation: Secondary | ICD-10-CM

## 2021-06-22 DIAGNOSIS — Z7901 Long term (current) use of anticoagulants: Secondary | ICD-10-CM

## 2021-06-22 DIAGNOSIS — I052 Rheumatic mitral stenosis with insufficiency: Secondary | ICD-10-CM

## 2021-06-22 DIAGNOSIS — Z01818 Encounter for other preprocedural examination: Secondary | ICD-10-CM

## 2021-06-22 DIAGNOSIS — I513 Intracardiac thrombosis, not elsewhere classified: Secondary | ICD-10-CM

## 2021-06-22 DIAGNOSIS — I4892 Unspecified atrial flutter: Secondary | ICD-10-CM

## 2021-06-22 DIAGNOSIS — Z8673 Personal history of transient ischemic attack (TIA), and cerebral infarction without residual deficits: Secondary | ICD-10-CM

## 2021-06-22 HISTORY — DX: Long term (current) use of anticoagulants: Z79.01

## 2021-06-22 LAB — POCT INR: INR: 3 (ref 2.0–3.0)
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
## 2021-06-22 NOTE — Patient Instructions (Signed)
Patient instructed to take medications as defined in the Anti-coagulation Track section of this encounter.  ?Patient instructed to take today's dose.  ?Patient instructed to take one (1) of your 7.5mg  strength warfarin tablets on Sundays, Tuesdays, Thursdays and Saturdays. On Mondays, Wednesdays and Fridays, take only one-half (1/2) of your tablet(s). Repeat this sequence weekly.  ?Patient verbalized understanding of these instructions.   ?
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
## 2021-06-22 NOTE — Telephone Encounter (Signed)
Telephoned patient using language line interpreter [phone]. Telephoned home number number, mailbox full. Telephoned mobile number, sister answered and gave the home number. Called the home number and someone answered and disconnected the call. ?

## 2021-06-24 NOTE — Progress Notes (Signed)
INTERNAL MEDICINE TEACHING ATTENDING ADDENDUM - Aldine Contes M.D  ?Duration- indefinite, Indication- afib, left atrial appendage thrombus, INR- therapeutic. Agree with pharmacy recommendations as outlined in their note.  ? ? ? ?

## 2021-06-25 ENCOUNTER — Ambulatory Visit (HOSPITAL_COMMUNITY)
Admission: RE | Admit: 2021-06-25 | Discharge: 2021-06-25 | Disposition: A | Discharge status: Home / Self Care | Payer: Medicaid Other | Source: Ambulatory Visit | Attending: Surgery | Admitting: Surgery

## 2021-06-25 DIAGNOSIS — I052 Rheumatic mitral stenosis with insufficiency: Secondary | ICD-10-CM | POA: Insufficient documentation

## 2021-06-25 DIAGNOSIS — Z01818 Encounter for other preprocedural examination: Secondary | ICD-10-CM | POA: Insufficient documentation

## 2021-07-03 ENCOUNTER — Other Ambulatory Visit: Payer: Self-pay | Admitting: Internal Medicine

## 2021-07-03 ENCOUNTER — Other Ambulatory Visit (HOSPITAL_COMMUNITY): Payer: Self-pay

## 2021-07-03 ENCOUNTER — Encounter: Payer: Self-pay | Admitting: Internal Medicine

## 2021-07-03 ENCOUNTER — Other Ambulatory Visit: Payer: Self-pay

## 2021-07-03 MED ORDER — DILTIAZEM HCL ER COATED BEADS 240 MG PO CP24
240.0000 mg | ORAL_CAPSULE | Freq: Every day | ORAL | 2 refills | Status: DC
  Filled 2021-07-03: qty 30, 30d supply, fill #0

## 2021-07-03 MED ORDER — DIGOXIN 250 MCG PO TABS
0.2500 mg | ORAL_TABLET | Freq: Every day | ORAL | 2 refills | Status: DC
  Filled 2021-07-03: qty 30, 30d supply, fill #0

## 2021-07-03 NOTE — Telephone Encounter (Signed)
Patient is calling back, because she is completely out of meds.  Forwarding message back to triage.   ?

## 2021-07-03 NOTE — Telephone Encounter (Signed)
aspirin 81 MG EC tablet ? ?atorvastatin (LIPITOR) 40 MG tablet ? ?digoxin (LANOXIN) 0.25 MG tablet ? ?diltiazem (CARDIZEM CD) 240 MG 24 hr capsule ? ?warfarin (COUMADIN) 7.5 MG tablet, REFILL REQUEST @ [facility]. ? ?Requesting meds to be filled by today.  ?  ?

## 2021-07-06 ENCOUNTER — Ambulatory Visit (INDEPENDENT_AMBULATORY_CARE_PROVIDER_SITE_OTHER): Payer: Self-pay | Admitting: Pharmacist

## 2021-07-06 ENCOUNTER — Other Ambulatory Visit (HOSPITAL_COMMUNITY): Payer: Self-pay

## 2021-07-06 DIAGNOSIS — I4892 Unspecified atrial flutter: Secondary | ICD-10-CM

## 2021-07-06 DIAGNOSIS — I052 Rheumatic mitral stenosis with insufficiency: Secondary | ICD-10-CM

## 2021-07-06 DIAGNOSIS — I4891 Unspecified atrial fibrillation: Secondary | ICD-10-CM

## 2021-07-06 DIAGNOSIS — I513 Intracardiac thrombosis, not elsewhere classified: Secondary | ICD-10-CM

## 2021-07-06 DIAGNOSIS — Z7901 Long term (current) use of anticoagulants: Secondary | ICD-10-CM

## 2021-07-06 DIAGNOSIS — Z8673 Personal history of transient ischemic attack (TIA), and cerebral infarction without residual deficits: Secondary | ICD-10-CM

## 2021-07-06 LAB — POCT INR: INR: 2.8 (ref 2.0–3.0)

## 2021-07-06 NOTE — Progress Notes (Signed)
INTERNAL MEDICINE TEACHING ATTENDING ADDENDUM - Abigail Butler M.D  ?Duration- indefinite, Indication- afib with rvr, CVA, INR- therapeutic. Agree with pharmacy recommendations as outlined in their note.  ? ? ? ?

## 2021-07-06 NOTE — Patient Instructions (Signed)
With the daughter interpreting,Patient instructed to take medications as defined in the Anti-coagulation Track section of this encounter.  ?With the daughter interpreting,Patient instructed to take today's dose.  ?With the daughter interpreting,Patient instructed to take one (1) of your 7.5mg  strength warfarin tablets on Sundays, Tuesdays, Thursdays and Saturdays. On Mondays, Wednesdays and Fridays, take only one-half (1/2) of your tablet(s). Repeat this sequence weekly.  ?With the daughter interpreting, Patient and daughter were advised--once the planned surgery date is know, to contact me at [phone]. At that time, warfarin will be discontinued for five (5) days prior to planned surgery, and either a LMWH or direct oral anticoagulant bridge off of warfarin up until 24 hours prior to planned surgery will be implemented.  ?With the daughter interpreting,Patient verbalized understanding of these instructions.   ?
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
## 2021-07-10 ENCOUNTER — Other Ambulatory Visit: Payer: Self-pay | Admitting: *Deleted

## 2021-07-10 DIAGNOSIS — I052 Rheumatic mitral stenosis with insufficiency: Secondary | ICD-10-CM

## 2021-07-13 ENCOUNTER — Ambulatory Visit (INDEPENDENT_AMBULATORY_CARE_PROVIDER_SITE_OTHER): Payer: Self-pay | Admitting: Pharmacist

## 2021-07-13 ENCOUNTER — Other Ambulatory Visit (HOSPITAL_COMMUNITY): Payer: Self-pay

## 2021-07-13 DIAGNOSIS — I513 Intracardiac thrombosis, not elsewhere classified: Secondary | ICD-10-CM

## 2021-07-13 DIAGNOSIS — Z8673 Personal history of transient ischemic attack (TIA), and cerebral infarction without residual deficits: Secondary | ICD-10-CM

## 2021-07-13 DIAGNOSIS — I4892 Unspecified atrial flutter: Secondary | ICD-10-CM

## 2021-07-13 DIAGNOSIS — Z7901 Long term (current) use of anticoagulants: Secondary | ICD-10-CM

## 2021-07-13 DIAGNOSIS — I4891 Unspecified atrial fibrillation: Secondary | ICD-10-CM

## 2021-07-13 LAB — POCT INR: INR: 1.7 — AB (ref 2.0–3.0)

## 2021-07-13 MED ORDER — FONDAPARINUX SODIUM 7.5 MG/0.6ML ~~LOC~~ SOLN
7.5000 mg | SUBCUTANEOUS | 0 refills | Status: DC
Start: 2021-07-13 — End: 2021-07-25
  Filled 2021-07-13: qty 3, 5d supply, fill #0
  Filled 2021-07-13: qty 1.2, 2d supply, fill #0
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
## 2021-07-13 NOTE — Patient Instructions (Signed)
Patient will be provided a prescription for fondaparinux (Arixtra) 7.5 milligrams to be administered every 24 hours.(7.5mg  dose/prefilled syringe[s].) ?Last dose of warfarin (Coumadin?) was SATURDAY, May 13th, 2023 at [facility]. ?Begin fondaparinux on Tuesday, Jul 14, 2021  8:00AM. Your dose will be 7.5 mg, administered subcutaneously, two-inches away from your ?belly-button? at the level of the waistband, alternating sites (right, left, right?) ?You will CONTINUE fondaparinux on an 8:00AM schedule until your LAST DOSE, which will be the MORNING DOSE on Wednesday, May 17th, 2023. ?Day of your planned surgery is Friday May 19th. 2023.  ?Post-operatively, your anticoagulation management will be provided by the clinical pharmacists at [facility]. They will work with your surgeon to determine the appropriate anticoagulation after your surgery.  ?Any questions regarding these instructions--call Pharmacist, Hulen Luster, PharmD, CPP at [phone]. ?If your surgery were to be cancelled or postponed, call Pharmacist, Hulen Luster, PharmD, CPP at [phone]. ?

## 2021-07-14 ENCOUNTER — Ambulatory Visit: Payer: Medicaid Other

## 2021-07-14 NOTE — Progress Notes (Signed)
Surgical Instructions ? ? ? Your procedure is scheduled on Friday, May 19th, 2023. ? ? Report to [facility] Main Entrance "A" at 05:30 A.M., then check in with the Admitting office. ? Call this number if you have problems the morning of surgery: ? [phone] ? ? If you have any questions prior to your surgery date call [phone]: Open Monday-Friday 8am-4pm ? ? ? Remember: ? Do not eat or drink after midnight the night before your surgery ?  ? Take these medicines the morning of surgery with A SIP OF WATER:  ? ?digoxin (LANOXIN) ?diltiazem (CARDIZEM CD) ? ?acetaminophen (TYLENOL) - if needed ? ?Follow your surgeon's instructions on when to stop Aspirin.  If no instructions were given by your surgeon then you will need to call the office to get those instructions.    ? ?warfarin (COUMADIN) -  last dose on Saturday 13-MAY-23 per MD ? ?As of today, STOP taking any Aspirin (unless otherwise instructed by your surgeon) Aleve, Naproxen, Ibuprofen, Motrin, Advil, Goody's, BC's, all herbal medications, fish oil, and all vitamins. ? ? The day of surgery: ?         ?Do not wear jewelry or makeup ?Do not wear lotions, powders, perfumes, or deodorant. ?Do not shave 48 hours prior to surgery.   ?Do not bring valuables to the hospital. ?Do not wear nail polish, gel polish, artificial nails, or any other type of covering on natural nails (fingers and toes) ?If you have artificial nails or gel coating that need to be removed by a nail salon, please have this removed prior to surgery. Artificial nails or gel coating may interfere with anesthesia's ability to adequately monitor your vital signs. ? ?[facility] is not responsible for any belongings or valuables. .  ? ?Do NOT Smoke (Tobacco/Vaping)  24 hours prior to your procedure ? ?If you use a CPAP at night, you may bring your mask for your overnight stay. ?  ?Contacts, glasses, hearing aids, dentures or partials may not be worn into surgery, please bring cases for these  belongings ?  ?For patients admitted to the hospital, discharge time will be determined by your treatment team. ?  ?Patients discharged the day of surgery will not be allowed to drive home, and someone needs to stay with them for 24 hours. ? ? ?SURGICAL WAITING ROOM VISITATION ?Patients having surgery or a procedure in a hospital may have two support people. ?Children under the age of 73 must have an adult with them who is not the patient. ?They may stay in the waiting area during the procedure and may switch out with other visitors. If the patient needs to stay at the hospital during part of their recovery, the visitor guidelines for inpatient rooms apply. ? ?Please refer to the [facility] website for the visitor guidelines for Inpatients (after your surgery is over and you are in a regular room).  ? ? ?Special instructions:   ? ?Oral Hygiene is also important to reduce your risk of infection.  Remember - BRUSH YOUR TEETH THE MORNING OF SURGERY WITH YOUR REGULAR TOOTHPASTE ? ? ?- Preparing For Surgery ? ?Before surgery, you can play an important role. Because skin is not sterile, your skin needs to be as free of germs as possible. You can reduce the number of germs on your skin by washing with CHG (chlorahexidine gluconate) Soap before surgery.  CHG is an antiseptic cleaner which kills germs and bonds with the skin to continue killing germs even  after washing.   ? ? ?Please do not use if you have an allergy to CHG or antibacterial soaps. If your skin becomes reddened/irritated stop using the CHG.  ?Do not shave (including legs and underarms) for at least 48 hours prior to first CHG shower. It is OK to shave your face. ? ?Please follow these instructions carefully. ?  ? ? Shower the NIGHT BEFORE SURGERY and the MORNING OF SURGERY with CHG Soap.  ? If you chose to wash your hair, wash your hair first as usual with your normal shampoo. After you shampoo, rinse your hair and body thoroughly to remove the  shampoo.  Then [employer] and genitals (private parts) with your normal soap and rinse thoroughly to remove soap. ? ?After that Use CHG Soap as you would any other liquid soap. You can apply CHG directly to the skin and wash gently with a scrungie or a clean washcloth.  ? ?Apply the CHG Soap to your body ONLY FROM THE NECK DOWN.  Do not use on open wounds or open sores. Avoid contact with your eyes, ears, mouth and genitals (private parts). Wash Face and genitals (private parts)  with your normal soap.  ? ?Wash thoroughly, paying special attention to the area where your surgery will be performed. ? ?Thoroughly rinse your body with warm water from the neck down. ? ?DO NOT shower/wash with your normal soap after using and rinsing off the CHG Soap. ? ?Pat yourself dry with a CLEAN TOWEL. ? ?Wear CLEAN PAJAMAS to bed the night before surgery ? ?Place CLEAN SHEETS on your bed the night before your surgery ? ?DO NOT SLEEP WITH PETS. ? ? ?Day of Surgery: ? ?Take a shower with CHG soap. ?Wear Clean/Comfortable clothing the morning of surgery ?Do not apply any deodorants/lotions.   ?Remember to brush your teeth WITH YOUR REGULAR TOOTHPASTE. ? ? ? ?If you received a COVID test during your pre-op visit, it is requested that you wear a mask when out in public, stay away from anyone that may not be feeling well, and notify your surgeon if you develop symptoms. If you have been in contact with anyone that has tested positive in the last 10 days, please notify your surgeon. ? ?  ?Please read over the following fact sheets that you were given.   ?

## 2021-07-14 NOTE — Progress Notes (Signed)
INTERNAL MEDICINE TEACHING ATTENDING ADDENDUM - Abigail Butler M.D  ?Duration- indefinite, Indication- valvular afib, INR- sub therapeutic. Agree with pharmacy recommendations as outlined in their note.  ? ?Patient scheduled for valve replacement and is being bridged with fondaparinux prior to the surgery (unable to do heparin/lovenox secondary to religious reasons - no porcine products) ? ?  ? ? ? ? ?

## 2021-07-15 ENCOUNTER — Ambulatory Visit (HOSPITAL_COMMUNITY): Admission: RE | Admit: 2021-07-15 | Payer: Self-pay | Source: Ambulatory Visit

## 2021-07-15 ENCOUNTER — Ambulatory Visit (HOSPITAL_COMMUNITY)
Admission: RE | Admit: 2021-07-15 | Discharge: 2021-07-15 | Disposition: A | Discharge status: Home / Self Care | Payer: Self-pay | Source: Ambulatory Visit | Attending: Surgery | Admitting: Surgery

## 2021-07-15 ENCOUNTER — Other Ambulatory Visit: Payer: Self-pay

## 2021-07-15 ENCOUNTER — Encounter (HOSPITAL_COMMUNITY): Payer: Self-pay

## 2021-07-15 ENCOUNTER — Encounter (HOSPITAL_COMMUNITY)
Admission: RE | Admit: 2021-07-15 | Discharge: 2021-07-15 | Disposition: A | Discharge status: Home / Self Care | Payer: Medicaid Other | Source: Ambulatory Visit | Attending: Surgery | Admitting: Surgery

## 2021-07-15 VITALS — BP 157/82 | HR 87 | Temp 98.7°F | Resp 17 | Wt 182.4 lb

## 2021-07-15 DIAGNOSIS — Z20822 Contact with and (suspected) exposure to covid-19: Secondary | ICD-10-CM | POA: Insufficient documentation

## 2021-07-15 DIAGNOSIS — Z01818 Encounter for other preprocedural examination: Secondary | ICD-10-CM | POA: Insufficient documentation

## 2021-07-15 DIAGNOSIS — I052 Rheumatic mitral stenosis with insufficiency: Secondary | ICD-10-CM | POA: Insufficient documentation

## 2021-07-15 LAB — BLOOD GAS, ARTERIAL
Acid-Base Excess: 1.5 mmol/L (ref 0.0–2.0)
Bicarbonate: 25.9 mmol/L (ref 20.0–28.0)
Drawn by: 58793
O2 Saturation: 98.9 %
Patient temperature: 37
pCO2 arterial: 39 mmHg (ref 32–48)
pH, Arterial: 7.43 (ref 7.35–7.45)
pO2, Arterial: 88 mmHg (ref 83–108)

## 2021-07-15 LAB — COMPREHENSIVE METABOLIC PANEL
ALT: 16 U/L (ref 0–44)
AST: 19 U/L (ref 15–41)
Albumin: 3.7 g/dL (ref 3.5–5.0)
Alkaline Phosphatase: 83 U/L (ref 38–126)
Anion gap: 8 (ref 5–15)
BUN: 6 mg/dL (ref 6–20)
CO2: 22 mmol/L (ref 22–32)
Calcium: 9 mg/dL (ref 8.9–10.3)
Chloride: 109 mmol/L (ref 98–111)
Creatinine, Ser: 0.58 mg/dL (ref 0.44–1.00)
GFR, Estimated: >60 mL/min (ref >60)
Glucose, Bld: 99 mg/dL (ref 70–99)
Potassium: 4.1 mmol/L (ref 3.5–5.1)
Sodium: 139 mmol/L (ref 135–145)
Total Bilirubin: 0.7 mg/dL (ref 0.3–1.2)
Total Protein: 7.8 g/dL (ref 6.5–8.1)

## 2021-07-15 LAB — URINALYSIS, ROUTINE W REFLEX MICROSCOPIC
Bacteria, UA: NONE SEEN
Bilirubin Urine: NEGATIVE
Glucose, UA: NEGATIVE mg/dL
Ketones, ur: NEGATIVE mg/dL
Leukocytes,Ua: NEGATIVE
Nitrite: NEGATIVE
Protein, ur: NEGATIVE mg/dL
Specific Gravity, Urine: 1.004 — ABNORMAL LOW (ref 1.005–1.030)
pH: 7 (ref 5.0–8.0)

## 2021-07-15 LAB — TYPE AND SCREEN
ABO/RH(D): A POS
Antibody Screen: NEGATIVE

## 2021-07-15 LAB — PROTIME-INR
INR: 1.3 — ABNORMAL HIGH (ref 0.8–1.2)
Prothrombin Time: 15.7 seconds — ABNORMAL HIGH (ref 11.4–15.2)

## 2021-07-15 LAB — CBC
HCT: 38.5 % (ref 36.0–46.0)
Hemoglobin: 12.1 g/dL (ref 12.0–15.0)
MCH: 27.8 pg (ref 26.0–34.0)
MCHC: 31.4 g/dL (ref 30.0–36.0)
MCV: 88.3 fL (ref 80.0–100.0)
Platelets: 317 10*3/uL (ref 150–400)
RBC: 4.36 MIL/uL (ref 3.87–5.11)
RDW: 12.7 % (ref 11.5–15.5)
WBC: 10.9 10*3/uL — ABNORMAL HIGH (ref 4.0–10.5)
nRBC: 0 % (ref 0.0–0.2)

## 2021-07-15 LAB — SURGICAL PCR SCREEN
MRSA, PCR: NEGATIVE
Staphylococcus aureus: NEGATIVE

## 2021-07-15 LAB — APTT: aPTT: 31 seconds (ref 24–36)

## 2021-07-15 LAB — HEMOGLOBIN A1C
Hgb A1c MFr Bld: 6.3 % — ABNORMAL HIGH (ref 4.8–5.6)
Mean Plasma Glucose: 134.11 mg/dL

## 2021-07-15 NOTE — Progress Notes (Signed)
PCP - Dr. Charleen Kirks ?Cardiologist - Dr. Cyndia Bent and Dr. Geraldo Pitter ? ?PPM/ICD - Denies ?Device Orders -  ?Rep Notified -  ? ?Chest x-ray - 07/15/21 ?EKG - 07/15/21 ?Stress Test - Long time ago in [place] ?ECHO - 05/27/21 ?Cardiac Cath - 04/19/17 ? ?Sleep Study - Denies ? ?DM - Denies ? ?Blood Thinner Instructions: Coumadin per patient stopped Saturday 13th ?Aspirin Instructions: continuing through day of surgery  ? ?COVID TEST- 07/15/21 ? ? ?Anesthesia review: Yes  ? ?Patient denies shortness of breath, fever, cough and chest pain at PAT appointment ? ? ?All instructions explained to the patient, with a verbal understanding of the material. Patient agrees to go over the instructions while at home for a better understanding. Patient also instructed to wear a mask while in public avoid anyone sick after being tested for COVID-19. The opportunity to ask questions was provided. ? ? ?

## 2021-07-16 LAB — SARS CORONAVIRUS 2 (TAT 6-24 HRS): SARS Coronavirus 2: NEGATIVE

## 2021-07-16 MED ORDER — SODIUM CHLORIDE 0.9 % IV SOLN
1.5000 mg/kg/h | INTRAVENOUS | Status: AC
  Administered 2021-07-17: 1.5 mg/kg/h via INTRAVENOUS
  Filled 2021-07-16: qty 25

## 2021-07-16 MED ORDER — SODIUM CHLORIDE 0.9 % IV SOLN
2.5000 mg/kg/h | INTRAVENOUS | Status: DC
  Filled 2021-07-16: qty 250

## 2021-07-16 MED ORDER — PLASMA-LYTE A IV SOLN
INTRAVENOUS | Status: DC
  Filled 2021-07-16: qty 1

## 2021-07-16 MED ORDER — CEFAZOLIN SODIUM-DEXTROSE 2-4 GM/100ML-% IV SOLN
2.0000 g | INTRAVENOUS | Status: AC
  Administered 2021-07-17: 2 g via INTRAVENOUS
  Filled 2021-07-16: qty 100

## 2021-07-16 MED ORDER — TRANEXAMIC ACID (OHS) PUMP PRIME SOLUTION
2.0000 mg/kg | INTRAVENOUS | Status: DC
Start: 2021-07-17 — End: 2021-07-17
  Filled 2021-07-16: qty 1.65

## 2021-07-16 MED ORDER — EPINEPHRINE HCL 5 MG/250ML IV SOLN IN NS
0.0000 ug/min | INTRAVENOUS | Status: DC
  Filled 2021-07-16: qty 250

## 2021-07-16 MED ORDER — PHENYLEPHRINE HCL-NACL 20-0.9 MG/250ML-% IV SOLN
30.0000 ug/min | INTRAVENOUS | Status: AC
  Administered 2021-07-17: 15 ug/min via INTRAVENOUS
  Filled 2021-07-16: qty 250

## 2021-07-16 MED ORDER — MILRINONE LACTATE IN DEXTROSE 20-5 MG/100ML-% IV SOLN
0.3000 ug/kg/min | INTRAVENOUS | Status: DC
  Filled 2021-07-16: qty 100

## 2021-07-16 MED ORDER — VANCOMYCIN HCL 1250 MG/250ML IV SOLN
1250.0000 mg | INTRAVENOUS | Status: AC
  Administered 2021-07-17: 1250 mg via INTRAVENOUS
  Filled 2021-07-16: qty 250

## 2021-07-16 MED ORDER — MANNITOL 20 % IV SOLN
INTRAVENOUS | Status: DC
  Filled 2021-07-16: qty 13

## 2021-07-16 MED ORDER — DEXMEDETOMIDINE HCL IN NACL 400 MCG/100ML IV SOLN
0.1000 ug/kg/h | INTRAVENOUS | Status: AC
  Administered 2021-07-17: .7 ug/kg/h via INTRAVENOUS
  Filled 2021-07-16: qty 100

## 2021-07-16 MED ORDER — NOREPINEPHRINE 4 MG/250ML-% IV SOLN
0.0000 ug/min | INTRAVENOUS | Status: DC
  Filled 2021-07-16: qty 250

## 2021-07-16 MED ORDER — NITROGLYCERIN IN D5W 200-5 MCG/ML-% IV SOLN
2.0000 ug/min | INTRAVENOUS | Status: DC
  Filled 2021-07-16: qty 250

## 2021-07-16 MED ORDER — ACD FORMULA A 0.73-2.45-2.2 GM/100ML VI SOLN
1000.0000 mL | Status: DC
  Filled 2021-07-16: qty 1000

## 2021-07-16 MED ORDER — BIVALIRUDIN LOAD/BOLUS VIA INFUSION (OHS/POSSIBLE HIT) OPTIME
1.0000 mg/kg | INTRAVENOUS | Status: DC
  Filled 2021-07-16: qty 83

## 2021-07-16 MED ORDER — TRANEXAMIC ACID (OHS) BOLUS VIA INFUSION
15.0000 mg/kg | INTRAVENOUS | Status: AC
  Administered 2021-07-17: 1240.5 mg via INTRAVENOUS
  Filled 2021-07-16: qty 1241

## 2021-07-16 MED ORDER — POTASSIUM CHLORIDE 2 MEQ/ML IV SOLN
80.0000 meq | INTRAVENOUS | Status: DC
Start: 2021-07-17 — End: 2021-07-17
  Filled 2021-07-16: qty 40

## 2021-07-16 MED ORDER — INSULIN REGULAR(HUMAN) IN NACL 100-0.9 UT/100ML-% IV SOLN
INTRAVENOUS | Status: AC
  Administered 2021-07-17: 4.2 [IU]/h via INTRAVENOUS
  Filled 2021-07-16: qty 100
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
## 2021-07-16 NOTE — Progress Notes (Signed)
Late entry from 07/15/21 1630  Per Alycia Rossetti, Rn for Dr. Laneta Simmers patient did not have to go get her doppler before leaving her appt today.  Patient arrive 45 min late to her pre admission testing appt.

## 2021-07-17 ENCOUNTER — Inpatient Hospital Stay (HOSPITAL_COMMUNITY)
Admission: RE | Admit: 2021-07-17 | Discharge: 2021-07-25 | DRG: 220 | Disposition: A | Discharge status: Home Health | Payer: Self-pay | Attending: Surgery | Admitting: Surgery

## 2021-07-17 ENCOUNTER — Inpatient Hospital Stay (HOSPITAL_COMMUNITY): Payer: Self-pay | Admitting: Physician Assistant

## 2021-07-17 ENCOUNTER — Other Ambulatory Visit: Payer: Self-pay

## 2021-07-17 ENCOUNTER — Inpatient Hospital Stay (HOSPITAL_COMMUNITY): Payer: Self-pay

## 2021-07-17 ENCOUNTER — Encounter (HOSPITAL_COMMUNITY): Payer: Self-pay | Admitting: Surgery

## 2021-07-17 ENCOUNTER — Inpatient Hospital Stay (HOSPITAL_COMMUNITY): Payer: Self-pay | Admitting: Certified Registered Nurse Anesthetist

## 2021-07-17 ENCOUNTER — Encounter (HOSPITAL_COMMUNITY)
Admission: RE | Disposition: A | Discharge status: Home / Self Care | Payer: Self-pay | Source: Home / Self Care | Attending: Surgery

## 2021-07-17 DIAGNOSIS — I340 Nonrheumatic mitral (valve) insufficiency: Secondary | ICD-10-CM

## 2021-07-17 DIAGNOSIS — I4892 Unspecified atrial flutter: Secondary | ICD-10-CM

## 2021-07-17 DIAGNOSIS — R42 Dizziness and giddiness: Secondary | ICD-10-CM | POA: Diagnosis present

## 2021-07-17 DIAGNOSIS — I052 Rheumatic mitral stenosis with insufficiency: Principal | ICD-10-CM | POA: Diagnosis present

## 2021-07-17 DIAGNOSIS — Z8679 Personal history of other diseases of the circulatory system: Secondary | ICD-10-CM

## 2021-07-17 DIAGNOSIS — Z8673 Personal history of transient ischemic attack (TIA), and cerebral infarction without residual deficits: Secondary | ICD-10-CM

## 2021-07-17 DIAGNOSIS — E876 Hypokalemia: Secondary | ICD-10-CM | POA: Diagnosis not present

## 2021-07-17 DIAGNOSIS — J811 Chronic pulmonary edema: Secondary | ICD-10-CM | POA: Diagnosis present

## 2021-07-17 DIAGNOSIS — J9811 Atelectasis: Secondary | ICD-10-CM | POA: Diagnosis not present

## 2021-07-17 DIAGNOSIS — Z952 Presence of prosthetic heart valve: Principal | ICD-10-CM

## 2021-07-17 DIAGNOSIS — I480 Paroxysmal atrial fibrillation: Secondary | ICD-10-CM

## 2021-07-17 DIAGNOSIS — I342 Nonrheumatic mitral (valve) stenosis: Secondary | ICD-10-CM

## 2021-07-17 DIAGNOSIS — I4891 Unspecified atrial fibrillation: Secondary | ICD-10-CM | POA: Diagnosis present

## 2021-07-17 DIAGNOSIS — K219 Gastro-esophageal reflux disease without esophagitis: Secondary | ICD-10-CM | POA: Diagnosis present

## 2021-07-17 DIAGNOSIS — Z79899 Other long term (current) drug therapy: Secondary | ICD-10-CM

## 2021-07-17 DIAGNOSIS — E8770 Fluid overload, unspecified: Secondary | ICD-10-CM | POA: Diagnosis not present

## 2021-07-17 DIAGNOSIS — Z7901 Long term (current) use of anticoagulants: Secondary | ICD-10-CM

## 2021-07-17 DIAGNOSIS — K5900 Constipation, unspecified: Secondary | ICD-10-CM | POA: Diagnosis not present

## 2021-07-17 DIAGNOSIS — J90 Pleural effusion, not elsewhere classified: Secondary | ICD-10-CM | POA: Diagnosis not present

## 2021-07-17 DIAGNOSIS — Z7982 Long term (current) use of aspirin: Secondary | ICD-10-CM

## 2021-07-17 DIAGNOSIS — R7303 Prediabetes: Secondary | ICD-10-CM | POA: Diagnosis present

## 2021-07-17 DIAGNOSIS — I10 Essential (primary) hypertension: Secondary | ICD-10-CM | POA: Diagnosis present

## 2021-07-17 DIAGNOSIS — R112 Nausea with vomiting, unspecified: Secondary | ICD-10-CM | POA: Diagnosis not present

## 2021-07-17 DIAGNOSIS — D62 Acute posthemorrhagic anemia: Secondary | ICD-10-CM | POA: Diagnosis not present

## 2021-07-17 DIAGNOSIS — Z91014 Allergy to mammalian meats: Secondary | ICD-10-CM

## 2021-07-17 DIAGNOSIS — I440 Atrioventricular block, first degree: Secondary | ICD-10-CM | POA: Diagnosis not present

## 2021-07-17 DIAGNOSIS — Z8615 Personal history of latent tuberculosis infection: Secondary | ICD-10-CM

## 2021-07-17 DIAGNOSIS — R32 Unspecified urinary incontinence: Secondary | ICD-10-CM | POA: Diagnosis not present

## 2021-07-17 HISTORY — PX: MAZE: SHX5063

## 2021-07-17 HISTORY — PX: TEE WITHOUT CARDIOVERSION: SHX5443

## 2021-07-17 HISTORY — DX: Presence of prosthetic heart valve: Z95.2

## 2021-07-17 HISTORY — PX: MITRAL VALVE REPLACEMENT: SHX147

## 2021-07-17 HISTORY — DX: Personal history of other diseases of the circulatory system: Z86.79

## 2021-07-17 LAB — POCT I-STAT 7, (LYTES, BLD GAS, ICA,H+H)
Acid-Base Excess: 0 mmol/L (ref 0.0–2.0)
Acid-Base Excess: 4 mmol/L — ABNORMAL HIGH (ref 0.0–2.0)
Acid-Base Excess: 3 mmol/L — ABNORMAL HIGH (ref 0.0–2.0)
Acid-Base Excess: 1 mmol/L (ref 0.0–2.0)
Acid-base deficit: 2 mmol/L (ref 0.0–2.0)
Acid-base deficit: 2 mmol/L (ref 0.0–2.0)
Acid-base deficit: 4 mmol/L — ABNORMAL HIGH (ref 0.0–2.0)
Acid-base deficit: 3 mmol/L — ABNORMAL HIGH (ref 0.0–2.0)
Bicarbonate: 23.6 mmol/L (ref 20.0–28.0)
Bicarbonate: 24.8 mmol/L (ref 20.0–28.0)
Bicarbonate: 29.3 mmol/L — ABNORMAL HIGH (ref 20.0–28.0)
Bicarbonate: 28.2 mmol/L — ABNORMAL HIGH (ref 20.0–28.0)
Bicarbonate: 25.7 mmol/L (ref 20.0–28.0)
Bicarbonate: 22.9 mmol/L (ref 20.0–28.0)
Bicarbonate: 22.9 mmol/L (ref 20.0–28.0)
Bicarbonate: 22.7 mmol/L (ref 20.0–28.0)
Calcium, Ion: 1.24 mmol/L (ref 1.15–1.40)
Calcium, Ion: 0.98 mmol/L — ABNORMAL LOW (ref 1.15–1.40)
Calcium, Ion: 1 mmol/L — ABNORMAL LOW (ref 1.15–1.40)
Calcium, Ion: 1.07 mmol/L — ABNORMAL LOW (ref 1.15–1.40)
Calcium, Ion: 1.07 mmol/L — ABNORMAL LOW (ref 1.15–1.40)
Calcium, Ion: 1.07 mmol/L — ABNORMAL LOW (ref 1.15–1.40)
Calcium, Ion: 1.1 mmol/L — ABNORMAL LOW (ref 1.15–1.40)
Calcium, Ion: 1.04 mmol/L — ABNORMAL LOW (ref 1.15–1.40)
HCT: 34 % — ABNORMAL LOW (ref 36.0–46.0)
HCT: 25 % — ABNORMAL LOW (ref 36.0–46.0)
HCT: 26 % — ABNORMAL LOW (ref 36.0–46.0)
HCT: 27 % — ABNORMAL LOW (ref 36.0–46.0)
HCT: 24 % — ABNORMAL LOW (ref 36.0–46.0)
HCT: 29 % — ABNORMAL LOW (ref 36.0–46.0)
HCT: 33 % — ABNORMAL LOW (ref 36.0–46.0)
HCT: 26 % — ABNORMAL LOW (ref 36.0–46.0)
Hemoglobin: 11.6 g/dL — ABNORMAL LOW (ref 12.0–15.0)
Hemoglobin: 8.5 g/dL — ABNORMAL LOW (ref 12.0–15.0)
Hemoglobin: 8.8 g/dL — ABNORMAL LOW (ref 12.0–15.0)
Hemoglobin: 9.2 g/dL — ABNORMAL LOW (ref 12.0–15.0)
Hemoglobin: 8.2 g/dL — ABNORMAL LOW (ref 12.0–15.0)
Hemoglobin: 9.9 g/dL — ABNORMAL LOW (ref 12.0–15.0)
Hemoglobin: 11.2 g/dL — ABNORMAL LOW (ref 12.0–15.0)
Hemoglobin: 8.8 g/dL — ABNORMAL LOW (ref 12.0–15.0)
O2 Saturation: 99 %
O2 Saturation: 100 %
O2 Saturation: 81 %
O2 Saturation: 100 %
O2 Saturation: 100 %
O2 Saturation: 99 %
O2 Saturation: 91 %
O2 Saturation: 95 %
Patient temperature: 36.8
Patient temperature: 37.3
Potassium: 4.2 mmol/L (ref 3.5–5.1)
Potassium: 4 mmol/L (ref 3.5–5.1)
Potassium: 4.2 mmol/L (ref 3.5–5.1)
Potassium: 4.5 mmol/L (ref 3.5–5.1)
Potassium: 4.9 mmol/L (ref 3.5–5.1)
Potassium: 4.4 mmol/L (ref 3.5–5.1)
Potassium: 4.3 mmol/L (ref 3.5–5.1)
Potassium: 4.4 mmol/L (ref 3.5–5.1)
Sodium: 142 mmol/L (ref 135–145)
Sodium: 141 mmol/L (ref 135–145)
Sodium: 144 mmol/L (ref 135–145)
Sodium: 140 mmol/L (ref 135–145)
Sodium: 139 mmol/L (ref 135–145)
Sodium: 140 mmol/L (ref 135–145)
Sodium: 141 mmol/L (ref 135–145)
Sodium: 140 mmol/L (ref 135–145)
TCO2: 25 mmol/L (ref 22–32)
TCO2: 26 mmol/L (ref 22–32)
TCO2: 31 mmol/L (ref 22–32)
TCO2: 30 mmol/L (ref 22–32)
TCO2: 27 mmol/L (ref 22–32)
TCO2: 24 mmol/L (ref 22–32)
TCO2: 24 mmol/L (ref 22–32)
TCO2: 24 mmol/L (ref 22–32)
pCO2 arterial: 44.9 mmHg (ref 32–48)
pCO2 arterial: 38.6 mmHg (ref 32–48)
pCO2 arterial: 46.2 mmHg (ref 32–48)
pCO2 arterial: 47.8 mmHg (ref 32–48)
pCO2 arterial: 40 mmHg (ref 32–48)
pCO2 arterial: 37 mmHg (ref 32–48)
pCO2 arterial: 50.8 mmHg — ABNORMAL HIGH (ref 32–48)
pCO2 arterial: 43.6 mmHg (ref 32–48)
pH, Arterial: 7.328 — ABNORMAL LOW (ref 7.35–7.45)
pH, Arterial: 7.41 (ref 7.35–7.45)
pH, Arterial: 7.416 (ref 7.35–7.45)
pH, Arterial: 7.378 (ref 7.35–7.45)
pH, Arterial: 7.417 (ref 7.35–7.45)
pH, Arterial: 7.399 (ref 7.35–7.45)
pH, Arterial: 7.261 — ABNORMAL LOW (ref 7.35–7.45)
pH, Arterial: 7.325 — ABNORMAL LOW (ref 7.35–7.45)
pO2, Arterial: 166 mmHg — ABNORMAL HIGH (ref 83–108)
pO2, Arterial: 415 mmHg — ABNORMAL HIGH (ref 83–108)
pO2, Arterial: 46 mmHg — ABNORMAL LOW (ref 83–108)
pO2, Arterial: 419 mmHg — ABNORMAL HIGH (ref 83–108)
pO2, Arterial: 389 mmHg — ABNORMAL HIGH (ref 83–108)
pO2, Arterial: 130 mmHg — ABNORMAL HIGH (ref 83–108)
pO2, Arterial: 70 mmHg — ABNORMAL LOW (ref 83–108)
pO2, Arterial: 82 mmHg — ABNORMAL LOW (ref 83–108)

## 2021-07-17 LAB — POCT I-STAT, CHEM 8
BUN: 8 mg/dL (ref 6–20)
BUN: 7 mg/dL (ref 6–20)
BUN: 7 mg/dL (ref 6–20)
BUN: 8 mg/dL (ref 6–20)
BUN: 7 mg/dL (ref 6–20)
Calcium, Ion: 1.23 mmol/L (ref 1.15–1.40)
Calcium, Ion: 1.16 mmol/L (ref 1.15–1.40)
Calcium, Ion: 1.03 mmol/L — ABNORMAL LOW (ref 1.15–1.40)
Calcium, Ion: 1.06 mmol/L — ABNORMAL LOW (ref 1.15–1.40)
Calcium, Ion: 1.05 mmol/L — ABNORMAL LOW (ref 1.15–1.40)
Chloride: 105 mmol/L (ref 98–111)
Chloride: 104 mmol/L (ref 98–111)
Chloride: 104 mmol/L (ref 98–111)
Chloride: 106 mmol/L (ref 98–111)
Chloride: 103 mmol/L (ref 98–111)
Creatinine, Ser: 0.5 mg/dL (ref 0.44–1.00)
Creatinine, Ser: 0.4 mg/dL — ABNORMAL LOW (ref 0.44–1.00)
Creatinine, Ser: 0.4 mg/dL — ABNORMAL LOW (ref 0.44–1.00)
Creatinine, Ser: 0.3 mg/dL — ABNORMAL LOW (ref 0.44–1.00)
Creatinine, Ser: 0.4 mg/dL — ABNORMAL LOW (ref 0.44–1.00)
Glucose, Bld: 148 mg/dL — ABNORMAL HIGH (ref 70–99)
Glucose, Bld: 139 mg/dL — ABNORMAL HIGH (ref 70–99)
Glucose, Bld: 118 mg/dL — ABNORMAL HIGH (ref 70–99)
Glucose, Bld: 138 mg/dL — ABNORMAL HIGH (ref 70–99)
Glucose, Bld: 154 mg/dL — ABNORMAL HIGH (ref 70–99)
HCT: 33 % — ABNORMAL LOW (ref 36.0–46.0)
HCT: 30 % — ABNORMAL LOW (ref 36.0–46.0)
HCT: 23 % — ABNORMAL LOW (ref 36.0–46.0)
HCT: 25 % — ABNORMAL LOW (ref 36.0–46.0)
HCT: 26 % — ABNORMAL LOW (ref 36.0–46.0)
Hemoglobin: 11.2 g/dL — ABNORMAL LOW (ref 12.0–15.0)
Hemoglobin: 10.2 g/dL — ABNORMAL LOW (ref 12.0–15.0)
Hemoglobin: 7.8 g/dL — ABNORMAL LOW (ref 12.0–15.0)
Hemoglobin: 8.5 g/dL — ABNORMAL LOW (ref 12.0–15.0)
Hemoglobin: 8.8 g/dL — ABNORMAL LOW (ref 12.0–15.0)
Potassium: 4.2 mmol/L (ref 3.5–5.1)
Potassium: 4.1 mmol/L (ref 3.5–5.1)
Potassium: 4.8 mmol/L (ref 3.5–5.1)
Potassium: 4.7 mmol/L (ref 3.5–5.1)
Potassium: 4.4 mmol/L (ref 3.5–5.1)
Sodium: 142 mmol/L (ref 135–145)
Sodium: 140 mmol/L (ref 135–145)
Sodium: 139 mmol/L (ref 135–145)
Sodium: 139 mmol/L (ref 135–145)
Sodium: 139 mmol/L (ref 135–145)
TCO2: 24 mmol/L (ref 22–32)
TCO2: 23 mmol/L (ref 22–32)
TCO2: 26 mmol/L (ref 22–32)
TCO2: 27 mmol/L (ref 22–32)
TCO2: 22 mmol/L (ref 22–32)

## 2021-07-17 LAB — GLUCOSE, CAPILLARY
Glucose-Capillary: 161 mg/dL — ABNORMAL HIGH (ref 70–99)
Glucose-Capillary: 122 mg/dL — ABNORMAL HIGH (ref 70–99)
Glucose-Capillary: 117 mg/dL — ABNORMAL HIGH (ref 70–99)
Glucose-Capillary: 128 mg/dL — ABNORMAL HIGH (ref 70–99)
Glucose-Capillary: 109 mg/dL — ABNORMAL HIGH (ref 70–99)
Glucose-Capillary: 99 mg/dL (ref 70–99)
Glucose-Capillary: 137 mg/dL — ABNORMAL HIGH (ref 70–99)
Glucose-Capillary: 157 mg/dL — ABNORMAL HIGH (ref 70–99)

## 2021-07-17 LAB — CBC
HCT: 32.3 % — ABNORMAL LOW (ref 36.0–46.0)
Hemoglobin: 10.2 g/dL — ABNORMAL LOW (ref 12.0–15.0)
MCH: 28 pg (ref 26.0–34.0)
MCHC: 31.6 g/dL (ref 30.0–36.0)
MCV: 88.7 fL (ref 80.0–100.0)
Platelets: 161 10*3/uL (ref 150–400)
RBC: 3.64 MIL/uL — ABNORMAL LOW (ref 3.87–5.11)
RDW: 12.7 % (ref 11.5–15.5)
WBC: 20.4 10*3/uL — ABNORMAL HIGH (ref 4.0–10.5)
nRBC: 0.1 % (ref 0.0–0.2)

## 2021-07-17 LAB — PROTIME-INR
INR: 1.1 (ref 0.8–1.2)
INR: 1.6 — ABNORMAL HIGH (ref 0.8–1.2)
Prothrombin Time: 14.5 seconds (ref 11.4–15.2)
Prothrombin Time: 18.7 seconds — ABNORMAL HIGH (ref 11.4–15.2)

## 2021-07-17 LAB — APTT: aPTT: 35 seconds (ref 24–36)

## 2021-07-17 LAB — ABO/RH: ABO/RH(D): A POS

## 2021-07-17 LAB — HEMOGLOBIN AND HEMATOCRIT, BLOOD
HCT: 24.3 % — ABNORMAL LOW (ref 36.0–46.0)
Hemoglobin: 8.2 g/dL — ABNORMAL LOW (ref 12.0–15.0)

## 2021-07-17 LAB — PLATELET COUNT: Platelets: 171 10*3/uL (ref 150–400)

## 2021-07-17 SURGERY — REPLACEMENT, MITRAL VALVE
Anesthesia: General | Site: Chest

## 2021-07-17 MED ORDER — METOPROLOL TARTRATE 25 MG/10 ML ORAL SUSPENSION: 12.5000 mg | Freq: Two times a day (BID) | ORAL | Status: DC

## 2021-07-17 MED ORDER — VASOPRESSIN 20 UNIT/ML IV SOLN
INTRAVENOUS | Status: AC
  Filled 2021-07-17: qty 1

## 2021-07-17 MED ORDER — PROTAMINE SULFATE 10 MG/ML IV SOLN
INTRAVENOUS | Status: DC | PRN
  Administered 2021-07-17: 280 mg via INTRAVENOUS
  Administered 2021-07-17: 20 mg via INTRAVENOUS

## 2021-07-17 MED ORDER — ALBUMIN HUMAN 5 % IV SOLN
250.0000 mL | INTRAVENOUS | Status: AC | PRN
  Administered 2021-07-17 (×4): 12.5 g via INTRAVENOUS
  Filled 2021-07-17: qty 250

## 2021-07-17 MED ORDER — PHENYLEPHRINE HCL-NACL 20-0.9 MG/250ML-% IV SOLN
0.0000 ug/min | INTRAVENOUS | Status: DC
  Administered 2021-07-17: 10 ug/min via INTRAVENOUS
  Administered 2021-07-18: 16 ug/min via INTRAVENOUS
  Filled 2021-07-17: qty 250

## 2021-07-17 MED ORDER — PROPOFOL 10 MG/ML IV BOLUS
INTRAVENOUS | Status: AC
  Filled 2021-07-17: qty 20

## 2021-07-17 MED ORDER — PANTOPRAZOLE SODIUM 40 MG PO TBEC
40.0000 mg | DELAYED_RELEASE_TABLET | Freq: Every day | ORAL | Status: DC
  Administered 2021-07-19 – 2021-07-25 (×7): 40 mg via ORAL
  Filled 2021-07-17 (×7): qty 1

## 2021-07-17 MED ORDER — HEPARIN SODIUM (PORCINE) 1000 UNIT/ML IJ SOLN
INTRAMUSCULAR | Status: DC | PRN
Start: 2021-07-17 — End: 2021-07-17
  Administered 2021-07-17: 30000 [IU] via INTRAVENOUS

## 2021-07-17 MED ORDER — EPHEDRINE 5 MG/ML INJ
INTRAVENOUS | Status: AC
  Filled 2021-07-17: qty 5

## 2021-07-17 MED ORDER — CHLORHEXIDINE GLUCONATE 0.12 % MT SOLN: 15.0000 mL | Freq: Once | OROMUCOSAL | Status: DC

## 2021-07-17 MED ORDER — THROMBIN (RECOMBINANT) 20000 UNITS EX SOLR
CUTANEOUS | Status: AC
  Filled 2021-07-17: qty 20000

## 2021-07-17 MED ORDER — PHENYLEPHRINE 80 MCG/ML (10ML) SYRINGE FOR IV PUSH (FOR BLOOD PRESSURE SUPPORT)
PREFILLED_SYRINGE | INTRAVENOUS | Status: DC | PRN
Start: 2021-07-17 — End: 2021-07-17
  Administered 2021-07-17 (×2): 80 ug via INTRAVENOUS
  Administered 2021-07-17: 40 ug via INTRAVENOUS

## 2021-07-17 MED ORDER — ROCURONIUM BROMIDE 10 MG/ML (PF) SYRINGE
PREFILLED_SYRINGE | INTRAVENOUS | Status: AC
  Filled 2021-07-17: qty 10

## 2021-07-17 MED ORDER — ORAL CARE MOUTH RINSE: 15.0000 mL | Freq: Once | OROMUCOSAL | Status: AC

## 2021-07-17 MED ORDER — THROMBIN 20000 UNITS EX SOLR
CUTANEOUS | Status: DC | PRN
  Administered 2021-07-17: 20000 [IU] via TOPICAL

## 2021-07-17 MED ORDER — PROTAMINE SULFATE 10 MG/ML IV SOLN
INTRAVENOUS | Status: AC
  Filled 2021-07-17: qty 25

## 2021-07-17 MED ORDER — BISACODYL 10 MG RE SUPP: 10.0000 mg | Freq: Every day | RECTAL | Status: DC

## 2021-07-17 MED ORDER — VASOPRESSIN 20 UNITS/100 ML INFUSION FOR SHOCK
0.0000 [IU]/min | INTRAVENOUS | Status: DC
  Administered 2021-07-17 (×2): 0.03 [IU]/min via INTRAVENOUS
  Filled 2021-07-17: qty 100

## 2021-07-17 MED ORDER — ALBUMIN HUMAN 5 % IV SOLN: INTRAVENOUS | Status: DC | PRN

## 2021-07-17 MED ORDER — METOPROLOL TARTRATE 12.5 MG HALF TABLET: 12.5000 mg | ORAL_TABLET | Freq: Two times a day (BID) | ORAL | Status: DC

## 2021-07-17 MED ORDER — POTASSIUM CHLORIDE 10 MEQ/50ML IV SOLN: 10.0000 meq | INTRAVENOUS | Status: AC

## 2021-07-17 MED ORDER — ONDANSETRON HCL 4 MG/2ML IJ SOLN
4.0000 mg | Freq: Four times a day (QID) | INTRAMUSCULAR | Status: DC | PRN
  Administered 2021-07-18 – 2021-07-23 (×6): 4 mg via INTRAVENOUS
  Filled 2021-07-17 (×8): qty 2

## 2021-07-17 MED ORDER — SODIUM CHLORIDE 0.9 % IV SOLN: INTRAVENOUS | Status: DC

## 2021-07-17 MED ORDER — FENTANYL CITRATE (PF) 250 MCG/5ML IJ SOLN
INTRAMUSCULAR | Status: AC
  Filled 2021-07-17: qty 5

## 2021-07-17 MED ORDER — CHLORHEXIDINE GLUCONATE 0.12 % MT SOLN
OROMUCOSAL | Status: AC
  Administered 2021-07-17: 15 mL via OROMUCOSAL
  Filled 2021-07-17: qty 15

## 2021-07-17 MED ORDER — PROPOFOL 10 MG/ML IV BOLUS
INTRAVENOUS | Status: DC | PRN
  Administered 2021-07-17: 10 mg via INTRAVENOUS
  Administered 2021-07-17: 30 mg via INTRAVENOUS

## 2021-07-17 MED ORDER — CEFAZOLIN SODIUM-DEXTROSE 2-4 GM/100ML-% IV SOLN
2.0000 g | Freq: Three times a day (TID) | INTRAVENOUS | Status: AC
  Administered 2021-07-17 – 2021-07-19 (×6): 2 g via INTRAVENOUS
  Filled 2021-07-17 (×6): qty 100

## 2021-07-17 MED ORDER — BISACODYL 5 MG PO TBEC
10.0000 mg | DELAYED_RELEASE_TABLET | Freq: Every day | ORAL | Status: DC
  Administered 2021-07-18 – 2021-07-19 (×2): 10 mg via ORAL
  Filled 2021-07-17 (×2): qty 2

## 2021-07-17 MED ORDER — TRAMADOL HCL 50 MG PO TABS
50.0000 mg | ORAL_TABLET | ORAL | Status: DC | PRN
  Administered 2021-07-18 – 2021-07-19 (×3): 100 mg via ORAL
  Filled 2021-07-17 (×3): qty 2

## 2021-07-17 MED ORDER — THROMBIN 20000 UNITS EX SOLR: OROMUCOSAL | Status: DC | PRN

## 2021-07-17 MED ORDER — SODIUM CHLORIDE 0.9% FLUSH
3.0000 mL | Freq: Two times a day (BID) | INTRAVENOUS | Status: DC
  Administered 2021-07-18 – 2021-07-20 (×5): 3 mL via INTRAVENOUS

## 2021-07-17 MED ORDER — ACETAMINOPHEN 160 MG/5ML PO SOLN: 1000.0000 mg | Freq: Four times a day (QID) | ORAL | Status: AC

## 2021-07-17 MED ORDER — MIDAZOLAM HCL 2 MG/2ML IJ SOLN: 2.0000 mg | INTRAMUSCULAR | Status: DC | PRN

## 2021-07-17 MED ORDER — 0.9 % SODIUM CHLORIDE (POUR BTL) OPTIME
TOPICAL | Status: DC | PRN
  Administered 2021-07-17: 5000 mL

## 2021-07-17 MED ORDER — MORPHINE SULFATE (PF) 2 MG/ML IV SOLN
1.0000 mg | INTRAVENOUS | Status: DC | PRN
  Administered 2021-07-17 – 2021-07-19 (×5): 2 mg via INTRAVENOUS
  Filled 2021-07-17 (×5): qty 1

## 2021-07-17 MED ORDER — LACTATED RINGERS IV SOLN: INTRAVENOUS | Status: DC | PRN

## 2021-07-17 MED ORDER — ACETAMINOPHEN 500 MG PO TABS
ORAL_TABLET | ORAL | Status: AC
  Administered 2021-07-17: 1000 mg via ORAL
  Filled 2021-07-17: qty 2

## 2021-07-17 MED ORDER — FAMOTIDINE IN NACL 20-0.9 MG/50ML-% IV SOLN
20.0000 mg | Freq: Two times a day (BID) | INTRAVENOUS | Status: AC
  Administered 2021-07-17 (×2): 20 mg via INTRAVENOUS
  Filled 2021-07-17 (×2): qty 50

## 2021-07-17 MED ORDER — ACETAMINOPHEN 160 MG/5ML PO SOLN: 650.0000 mg | Freq: Once | ORAL | Status: AC

## 2021-07-17 MED ORDER — MIDAZOLAM HCL (PF) 5 MG/ML IJ SOLN
INTRAMUSCULAR | Status: DC | PRN
  Administered 2021-07-17 (×2): 1 mg via INTRAVENOUS
  Administered 2021-07-17: 2 mg via INTRAVENOUS
  Administered 2021-07-17 (×6): 1 mg via INTRAVENOUS

## 2021-07-17 MED ORDER — ONDANSETRON HCL 4 MG/2ML IJ SOLN
INTRAMUSCULAR | Status: AC
  Filled 2021-07-17: qty 2

## 2021-07-17 MED ORDER — ACETAMINOPHEN 650 MG RE SUPP
650.0000 mg | Freq: Once | RECTAL | Status: AC
  Administered 2021-07-17: 650 mg via RECTAL

## 2021-07-17 MED ORDER — SODIUM CHLORIDE 0.9 % IV SOLN: 250.0000 mL | INTRAVENOUS | Status: DC

## 2021-07-17 MED ORDER — ASPIRIN 325 MG PO TBEC
325.0000 mg | DELAYED_RELEASE_TABLET | Freq: Every day | ORAL | Status: DC
  Administered 2021-07-18: 325 mg via ORAL
  Filled 2021-07-17: qty 1

## 2021-07-17 MED ORDER — ACETAMINOPHEN 500 MG PO TABS
1000.0000 mg | ORAL_TABLET | Freq: Once | ORAL | Status: AC
Start: 2021-07-17 — End: 2021-07-17

## 2021-07-17 MED ORDER — MIDAZOLAM HCL (PF) 10 MG/2ML IJ SOLN
INTRAMUSCULAR | Status: AC
  Filled 2021-07-17: qty 2

## 2021-07-17 MED ORDER — ACETAMINOPHEN 500 MG PO TABS
1000.0000 mg | ORAL_TABLET | Freq: Four times a day (QID) | ORAL | Status: AC
  Administered 2021-07-18 – 2021-07-22 (×14): 1000 mg via ORAL
  Filled 2021-07-17 (×15): qty 2

## 2021-07-17 MED ORDER — SODIUM CHLORIDE (PF) 0.9 % IJ SOLN
INTRAMUSCULAR | Status: AC
  Filled 2021-07-17: qty 10

## 2021-07-17 MED ORDER — CHLORHEXIDINE GLUCONATE CLOTH 2 % EX PADS
6.0000 | MEDICATED_PAD | Freq: Every day | CUTANEOUS | Status: DC
  Administered 2021-07-17 – 2021-07-20 (×4): 6 via TOPICAL

## 2021-07-17 MED ORDER — ROCURONIUM BROMIDE 10 MG/ML (PF) SYRINGE
PREFILLED_SYRINGE | INTRAVENOUS | Status: DC | PRN
  Administered 2021-07-17: 20 mg via INTRAVENOUS
  Administered 2021-07-17: 80 mg via INTRAVENOUS
  Administered 2021-07-17: 30 mg via INTRAVENOUS
  Administered 2021-07-17: 20 mg via INTRAVENOUS
  Administered 2021-07-17: 30 mg via INTRAVENOUS
  Administered 2021-07-17: 20 mg via INTRAVENOUS

## 2021-07-17 MED ORDER — PLASMA-LYTE A IV SOLN: INTRAVENOUS | Status: DC | PRN

## 2021-07-17 MED ORDER — METOPROLOL TARTRATE 5 MG/5ML IV SOLN: 2.5000 mg | INTRAVENOUS | Status: DC | PRN

## 2021-07-17 MED ORDER — VASOPRESSIN 20 UNIT/ML IV SOLN
INTRAVENOUS | Status: DC | PRN
  Administered 2021-07-17: .5 [IU] via INTRAVENOUS
  Administered 2021-07-17: 1 [IU] via INTRAVENOUS

## 2021-07-17 MED ORDER — PHENYLEPHRINE 80 MCG/ML (10ML) SYRINGE FOR IV PUSH (FOR BLOOD PRESSURE SUPPORT)
PREFILLED_SYRINGE | INTRAVENOUS | Status: AC
  Filled 2021-07-17: qty 10

## 2021-07-17 MED ORDER — METOPROLOL TARTRATE 12.5 MG HALF TABLET
ORAL_TABLET | ORAL | Status: AC
Start: 2021-07-17 — End: 2021-07-17
  Administered 2021-07-17: 12.5 mg via ORAL
  Filled 2021-07-17: qty 1

## 2021-07-17 MED ORDER — NITROGLYCERIN IN D5W 200-5 MCG/ML-% IV SOLN: 0.0000 ug/min | INTRAVENOUS | Status: DC

## 2021-07-17 MED ORDER — LACTATED RINGERS IV SOLN
500.0000 mL | Freq: Once | INTRAVENOUS | Status: AC | PRN
  Administered 2021-07-18: 500 mL via INTRAVENOUS

## 2021-07-17 MED ORDER — ~~LOC~~ CARDIAC SURGERY, PATIENT & FAMILY EDUCATION
Freq: Once | Status: DC
  Filled 2021-07-17: qty 1

## 2021-07-17 MED ORDER — ARTIFICIAL TEARS OPHTHALMIC OINT
TOPICAL_OINTMENT | OPHTHALMIC | Status: AC
  Filled 2021-07-17: qty 3.5

## 2021-07-17 MED ORDER — DEXMEDETOMIDINE HCL IN NACL 400 MCG/100ML IV SOLN
0.0000 ug/kg/h | INTRAVENOUS | Status: DC
  Administered 2021-07-17: 0.5 ug/kg/h via INTRAVENOUS

## 2021-07-17 MED ORDER — INSULIN REGULAR(HUMAN) IN NACL 100-0.9 UT/100ML-% IV SOLN
INTRAVENOUS | Status: DC
  Administered 2021-07-17 – 2021-07-18 (×2): 2 [IU]/h via INTRAVENOUS

## 2021-07-17 MED ORDER — LACTATED RINGERS IV SOLN: INTRAVENOUS | Status: DC

## 2021-07-17 MED ORDER — VANCOMYCIN HCL IN DEXTROSE 1-5 GM/200ML-% IV SOLN
1000.0000 mg | Freq: Once | INTRAVENOUS | Status: AC
  Administered 2021-07-17: 1000 mg via INTRAVENOUS
  Filled 2021-07-17: qty 200

## 2021-07-17 MED ORDER — METOPROLOL TARTRATE 12.5 MG HALF TABLET: 12.5000 mg | ORAL_TABLET | Freq: Once | ORAL | Status: AC

## 2021-07-17 MED ORDER — ASPIRIN 81 MG PO CHEW: 324.0000 mg | CHEWABLE_TABLET | Freq: Every day | ORAL | Status: DC

## 2021-07-17 MED ORDER — CHLORHEXIDINE GLUCONATE 0.12 % MT SOLN
15.0000 mL | OROMUCOSAL | Status: AC
  Administered 2021-07-17: 15 mL via OROMUCOSAL
  Filled 2021-07-17: qty 15

## 2021-07-17 MED ORDER — ARTIFICIAL TEARS OPHTHALMIC OINT
TOPICAL_OINTMENT | OPHTHALMIC | Status: DC | PRN
  Administered 2021-07-17: 1 via OPHTHALMIC

## 2021-07-17 MED ORDER — SODIUM CHLORIDE 0.9 % IV SOLN: INTRAVENOUS | Status: DC | PRN

## 2021-07-17 MED ORDER — DEXTROSE 50 % IV SOLN: 0.0000 mL | INTRAVENOUS | Status: DC | PRN

## 2021-07-17 MED ORDER — HEMOSTATIC AGENTS (NO CHARGE) OPTIME
TOPICAL | Status: DC | PRN
  Administered 2021-07-17: 1 via TOPICAL

## 2021-07-17 MED ORDER — OXYCODONE HCL 5 MG PO TABS
5.0000 mg | ORAL_TABLET | ORAL | Status: DC | PRN
  Administered 2021-07-17 – 2021-07-20 (×7): 10 mg via ORAL
  Filled 2021-07-17 (×7): qty 2

## 2021-07-17 MED ORDER — SODIUM CHLORIDE 0.45 % IV SOLN: INTRAVENOUS | Status: DC | PRN

## 2021-07-17 MED ORDER — HEPARIN SODIUM (PORCINE) 1000 UNIT/ML IJ SOLN
INTRAMUSCULAR | Status: AC
  Filled 2021-07-17: qty 1

## 2021-07-17 MED ORDER — ATORVASTATIN CALCIUM 40 MG PO TABS
40.0000 mg | ORAL_TABLET | Freq: Every day | ORAL | Status: DC
  Administered 2021-07-18 – 2021-07-25 (×8): 40 mg via ORAL
  Filled 2021-07-17 (×8): qty 1

## 2021-07-17 MED ORDER — CHLORHEXIDINE GLUCONATE 0.12 % MT SOLN: 15.0000 mL | Freq: Once | OROMUCOSAL | Status: AC

## 2021-07-17 MED ORDER — SODIUM CHLORIDE 0.9% FLUSH: 3.0000 mL | INTRAVENOUS | Status: DC | PRN

## 2021-07-17 MED ORDER — MAGNESIUM SULFATE 4 GM/100ML IV SOLN
4.0000 g | Freq: Once | INTRAVENOUS | Status: AC
  Administered 2021-07-17: 4 g via INTRAVENOUS
  Filled 2021-07-17: qty 100

## 2021-07-17 MED ORDER — DOCUSATE SODIUM 100 MG PO CAPS
200.0000 mg | ORAL_CAPSULE | Freq: Every day | ORAL | Status: DC
  Administered 2021-07-18 – 2021-07-20 (×3): 200 mg via ORAL
  Filled 2021-07-17 (×3): qty 2

## 2021-07-17 MED ORDER — VASOPRESSIN 20 UNITS/100 ML INFUSION FOR SHOCK
INTRAVENOUS | Status: DC | PRN
  Administered 2021-07-17: .03 [IU]/min via INTRAVENOUS

## 2021-07-17 MED ORDER — FENTANYL CITRATE (PF) 250 MCG/5ML IJ SOLN
INTRAMUSCULAR | Status: DC | PRN
  Administered 2021-07-17 (×3): 50 ug via INTRAVENOUS
  Administered 2021-07-17: 100 ug via INTRAVENOUS
  Administered 2021-07-17 (×2): 50 ug via INTRAVENOUS
  Administered 2021-07-17: 150 ug via INTRAVENOUS

## 2021-07-17 MED ORDER — CHLORHEXIDINE GLUCONATE 4 % EX LIQD: 30.0000 mL | CUTANEOUS | Status: DC

## 2021-07-17 MED ORDER — EPHEDRINE SULFATE (PRESSORS) 50 MG/ML IJ SOLN
INTRAMUSCULAR | Status: DC | PRN
  Administered 2021-07-17 (×2): 2.5 mg via INTRAVENOUS

## 2021-07-17 MED ORDER — ORAL CARE MOUTH RINSE
15.0000 mL | Freq: Two times a day (BID) | OROMUCOSAL | Status: DC
  Administered 2021-07-18 – 2021-07-25 (×14): 15 mL via OROMUCOSAL

## 2021-07-17 MED ORDER — SODIUM BICARBONATE 8.4 % IV SOLN
50.0000 meq | Freq: Once | INTRAVENOUS | Status: AC
  Administered 2021-07-17: 50 meq via INTRAVENOUS

## 2021-07-17 MED ORDER — PROTAMINE SULFATE 10 MG/ML IV SOLN
INTRAVENOUS | Status: AC
  Filled 2021-07-17: qty 5

## 2021-07-17 SURGICAL SUPPLY — 83 items
ADAPTER CARDIO PERF ANTE/RETRO (ADAPTER) ×3 IMPLANT
ARTICLIP LAA PROCLIP II 35 (Clip) ×3 IMPLANT
BAG DECANTER FOR FLEXI CONT (MISCELLANEOUS) ×3 IMPLANT
BLADE CLIPPER SURG (BLADE) ×3 IMPLANT
BLADE STERNUM SYSTEM 6 (BLADE) ×3 IMPLANT
BLADE SURG 15 STRL LF DISP TIS (BLADE) ×2 IMPLANT
BLADE SURG 15 STRL SS (BLADE) ×1
CANISTER SUCT 3000ML PPV (MISCELLANEOUS) ×3 IMPLANT
CANN PRFSN 3/8XCNCT ST RT ANG (MISCELLANEOUS) ×2
CANNULA ARTERIAL NVNT 3/8 22FR (MISCELLANEOUS) ×1 IMPLANT
CANNULA ARTERIAL VENT 3/8 20FR (CANNULA) ×1 IMPLANT
CANNULA GUNDRY RCSP 15FR (MISCELLANEOUS) ×3 IMPLANT
CANNULA PRFSN 3/8XCNCT RT ANG (MISCELLANEOUS) IMPLANT
CANNULA RT ANGLE VENOUS 36FR (CANNULA) IMPLANT
CANNULA SUMP PERICARDIAL (CANNULA) ×3 IMPLANT
CANNULA VEN MTL TIP RT (MISCELLANEOUS) ×1
CANNULAE RT ANGLE VENOUS 36FR (CANNULA) ×3
CARDIOBLATE CARDIAC ABLATION (MISCELLANEOUS)
CATH ROBINSON RED A/P 18FR (CATHETERS) ×9 IMPLANT
CATH THORACIC 36FR (CATHETERS) ×3 IMPLANT
CATH THORACIC 36FR RT ANG (CATHETERS) ×3 IMPLANT
CLAMP ISOLATOR SYNERGY LG (MISCELLANEOUS) ×1 IMPLANT
CONN 1/2X1/2X1/2  BEN (MISCELLANEOUS) ×1
CONN 1/2X1/2X1/2 BEN (MISCELLANEOUS) ×2 IMPLANT
CONN 3/8X1/2 ST GISH (MISCELLANEOUS) ×6 IMPLANT
CONTAINER PROTECT SURGISLUSH (MISCELLANEOUS) ×6 IMPLANT
COVER SURGICAL LIGHT HANDLE (MISCELLANEOUS) ×6 IMPLANT
DEVICE ATRICLIP LAA PRCLPII 35 (Clip) IMPLANT
DEVICE CARDIOBLATE CARDIAC ABL (MISCELLANEOUS) IMPLANT
DEVICE SUT CK QUICK LOAD MINI (Prosthesis & Implant Heart) ×1 IMPLANT
DRAPE CARDIOVASCULAR INCISE (DRAPES) ×1
DRAPE SRG 135X102X78XABS (DRAPES) ×2 IMPLANT
DRAPE WARM FLUID 44X44 (DRAPES) IMPLANT
DRSG COVADERM 4X14 (GAUZE/BANDAGES/DRESSINGS) ×3 IMPLANT
ELECT CAUTERY BLADE 6.4 (BLADE) ×3 IMPLANT
ELECT REM PT RETURN 9FT ADLT (ELECTROSURGICAL) ×6
ELECTRODE REM PT RTRN 9FT ADLT (ELECTROSURGICAL) ×4 IMPLANT
FELT TEFLON 1X6 (MISCELLANEOUS) ×6 IMPLANT
GAUZE 4X4 16PLY ~~LOC~~+RFID DBL (SPONGE) ×3 IMPLANT
GAUZE SPONGE 4X4 12PLY STRL (GAUZE/BANDAGES/DRESSINGS) ×6 IMPLANT
GAUZE SPONGE 4X4 12PLY STRL LF (GAUZE/BANDAGES/DRESSINGS) ×1 IMPLANT
GLOVE SURG MICRO LTX SZ7 (GLOVE) ×6 IMPLANT
GOWN STRL REUS W/ TWL LRG LVL3 (GOWN DISPOSABLE) ×8 IMPLANT
GOWN STRL REUS W/ TWL XL LVL3 (GOWN DISPOSABLE) ×2 IMPLANT
GOWN STRL REUS W/TWL LRG LVL3 (GOWN DISPOSABLE) ×4
GOWN STRL REUS W/TWL XL LVL3 (GOWN DISPOSABLE) ×1
HEMOSTAT POWDER SURGIFOAM 1G (HEMOSTASIS) ×9 IMPLANT
HEMOSTAT SURGICEL 2X14 (HEMOSTASIS) ×3 IMPLANT
KIT BASIN OR (CUSTOM PROCEDURE TRAY) ×3 IMPLANT
KIT CATH CPB BARTLE (MISCELLANEOUS) ×3 IMPLANT
KIT SUCTION CATH 14FR (SUCTIONS) ×3 IMPLANT
KIT SUT CK MINI COMBO 4X17 (Prosthesis & Implant Heart) ×1 IMPLANT
KIT TURNOVER KIT B (KITS) ×3 IMPLANT
LINE VENT (MISCELLANEOUS) ×1 IMPLANT
LOOP VESSEL SUPERMAXI WHITE (MISCELLANEOUS) ×4 IMPLANT
NS IRRIG 1000ML POUR BTL (IV SOLUTION) ×15 IMPLANT
PACK OPEN HEART (CUSTOM PROCEDURE TRAY) ×3 IMPLANT
PAD ARMBOARD 7.5X6 YLW CONV (MISCELLANEOUS) ×6 IMPLANT
POSITIONER HEAD DONUT 9IN (MISCELLANEOUS) ×3 IMPLANT
PROBE CRYO2-ABLATION MALLABLE (MISCELLANEOUS) ×1 IMPLANT
SET MPS 3-ND DEL (MISCELLANEOUS) ×1 IMPLANT
SPONGE T-LAP 18X18 ~~LOC~~+RFID (SPONGE) ×12 IMPLANT
SPONGE T-LAP 4X18 ~~LOC~~+RFID (SPONGE) ×3 IMPLANT
SUT BONE WAX W31G (SUTURE) ×3 IMPLANT
SUT ETHIBOND 2 0 SH (SUTURE) ×3 IMPLANT
SUT ETHIBOND 2 0 SH 36X2 (SUTURE) ×2 IMPLANT
SUT ETHIBOND 2 0 V4 (SUTURE) IMPLANT
SUT ETHIBOND 2 0 V5 (SUTURE) ×2 IMPLANT
SUT ETHIBOND 2 0V4 GREEN (SUTURE) IMPLANT
SUT PROLENE 3 0 SH 48 (SUTURE) ×12 IMPLANT
SUT PROLENE 3 0 SH1 36 (SUTURE) ×3 IMPLANT
SUT PROLENE 4 0 RB 1 (SUTURE) ×4
SUT PROLENE 4-0 RB1 .5 CRCL 36 (SUTURE) ×4 IMPLANT
SUT SILK 2 0 SH CR/8 (SUTURE) ×1 IMPLANT
SUT VIC AB 1 CTX 36 (SUTURE) ×2
SUT VIC AB 1 CTX36XBRD ANBCTR (SUTURE) ×4 IMPLANT
SYSTEM SAHARA CHEST DRAIN ATS (WOUND CARE) ×3 IMPLANT
TOWEL GREEN STERILE (TOWEL DISPOSABLE) ×3 IMPLANT
TOWEL GREEN STERILE FF (TOWEL DISPOSABLE) ×3 IMPLANT
TRAY FOLEY SLVR 16FR TEMP STAT (SET/KITS/TRAYS/PACK) ×3 IMPLANT
UNDERPAD 30X36 HEAVY ABSORB (UNDERPADS AND DIAPERS) ×3 IMPLANT
VALVE MITRAL MITRIS RESILIA 27 (Valve) ×1 IMPLANT
WATER STERILE IRR 1000ML POUR (IV SOLUTION) ×6 IMPLANT

## 2021-07-17 NOTE — Progress Notes (Signed)
      [address]       [name] [address]             [phone]      S/p MVR Maze  Intubated, starting to wake up  BP (!) 96/54 (BP Location: Right Arm)   Pulse 88   Temp 99.3 F (37.4 C)   Resp 17   Ht 5\' 4"  (1.626 m)   Wt 82.7 kg   SpO2 99%   BMI 31.30 kg/m  Paced for bradycardia, pacer inappropriately sensing, change sensitivities CO 3.1, CI 1.6, 29/15  Intake/Output Summary (Last 24 hours) at 07/17/2021 1735 Last data filed at 07/17/2021 1700 Gross per 24 hour  Intake 4381.39 ml  Output 2305 ml  Net 2076.39 ml   Minimal CT output  K= 4.4, Ca 1.07 HCt 32  Doing well early postop  07/19/2021 C. Viviann Spare, MD [facility] [phone]
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
## 2021-07-17 NOTE — Interval H&P Note (Signed)
History and Physical Interval Note:  07/17/2021 6:51 AM  Abigail Butler  has presented today for surgery, with the diagnosis of SEVERE MS AFIB.  The various methods of treatment have been discussed with the patient and family. After consideration of risks, benefits and other options for treatment, the patient has consented to  Procedure(s): MITRAL VALVE (MV) REPLACEMENT (N/A) MAZE (N/A) TRANSESOPHAGEAL ECHOCARDIOGRAM (TEE) (N/A) as a surgical intervention.  The patient's history has been reviewed, patient examined, no change in status, stable for surgery.  I have reviewed the patient's chart and labs.  Questions were answered to the patient's satisfaction.     Alleen Borne

## 2021-07-17 NOTE — Progress Notes (Signed)
Per the OR, urology placed foley catheter in the OR, please do not remove without consulting them first.

## 2021-07-17 NOTE — Op Note (Signed)
CARDIOVASCULAR SURGERY OPERATIVE NOTE  07/17/2021 Abigail Butler CE:[id number]  Surgeon:  Gaye Pollack, MD  First Assistant: Ellwood Handler,  PA-C: An experienced assistant was required given the complexity of this surgery and the standard of surgical care. The assistant was needed for exposure, dissection, suctioning, retraction of delicate tissues and sutures, instrument exchange and for overall help during this procedure.    Preoperative Diagnosis:  Severe mitral stenosis, atrial fibrillation and atrial flutter.   Postoperative Diagnosis:  Same   Procedure:  Median Sternotomy Extracorporeal circulation 3.   Mitral valve replacement using a 27 mm Edwards MITRIS pericardial valve. 4.   Bi-atrial MAZE IV with clipping of left atrial appendage.  Anesthesia:  General Endotracheal   Clinical History/Surgical Indication:  This 56 year old woman has severe, symptomatic rheumatic mitral stenosis and newly diagnosed atrial fibrillation/flutter with rapid ventricular response that has been difficult to control at times.  She had a large clot in the left atrial appendage on TEE.  She is now on anticoagulation.  I agree that mitral valve replacement and Maze procedure with clipping of left atrial appendage is the best treatment for this patient.  I think it would be best to use a bioprosthetic valve given her social situation. She had a catheterization in 2019 that showed normal coronary arteries and I do not think that needs to be repeated.   I discussed the operative procedure with the patient and her daughter via interpreter including alternatives, benefits and risks; including but not limited to bleeding, blood transfusion, infection, stroke, myocardial infarction, graft failure, heart block requiring a permanent pacemaker, organ dysfunction, and death.  I also discussed the possibility of structural valve deterioration in the future that may require further procedures.  Abigail Butler understands  and agrees to proceed.    Preparation:  The patient was seen in the preoperative holding area and the correct patient, correct operation were confirmed with the patient after reviewing the medical record and catheterization. The consent was signed by me. Preoperative antibiotics were given. A pulmonary arterial line and radial arterial line were placed by the anesthesia team. The patient was taken back to the operating room and positioned supine on the operating room table. After being placed under general endotracheal anesthesia by the anesthesia team a foley catheter was placed. The neck, chest, abdomen, and both legs were prepped with betadine soap and solution and draped in the usual sterile manner. A surgical time-out was taken and the correct patient and operative procedure were confirmed with the nursing and anesthesia staff.   Pre-bypass TEE:   Complete TEE assessment was performed by Dr. Suzette Battiest. This showed a rheumatic appearing MV with severe stenosis and mild regurgitation. LV function normal. Moderate TR, mild AI.    Post-bypass TEE:   Normal functioning prosthetic mitral valve with no perivalvular leak or regurgitation through the valve. Left ventricular function preserved.     Cardiopulmonary Bypass:  A median sternotomy was performed. The pericardium was opened in the midline. Right ventricular function appeared normal. The ascending aorta was of normal size and had no palpable plaque. There were no contraindications to aortic cannulation or cross-clamping. The patient was fully systemically heparinized and the ACT was maintained > 400 sec. The proximal aortic arch was cannulated with a 20 F aortic cannula for arterial inflow. Bi-caval venous cannulation was performed via the SVC with a 52F metal tip right angle cannula and the IVC with a 64F right angle cannula. Caval tapes were applied. An antegrade cardioplegia/vent cannula  was inserted into the mid-ascending aorta.  A retrograde cardioplegia cannnula was placed into the coronary sinus via the right atrium. Aortic occlusion was performed with a single cross-clamp. Systemic cooling to 32 degrees Centigrade and topical cooling of the heart with iced saline were used. Cold antegrade KBC cardioplegia was used to induce diastolic arrest and then cold blood retrograde cardioplegia was given at about 60 minute intervals throughout the period of arrest. A temperature probe was inserted into the interventricular septum and an insulating pad was placed in the pericardium. Carbon dioxide was insufflated into the pericardium at 5L/min throughout the procedure to minimize intracardiac air.  Bi-atrial Maze IV.  The left pulmonary veins were circled with a tape. An Atricure bipolar RF clamp was used to create a lesion around the pulmonary veins on the left atrial wall. Three activations of RF energy were applied with two applications. A cryo lesion was applied from the base of the LAA to the left pulmonary vein lesion for 3 minutes. The base of the LAA appendage was measured and a 35 mm Atricure open ended clip was applied to the base of the appendage to isolate it. The left atrium was opened by a vertical incision in the interatrial groove. An encircling lesion was then created around the right pulmonary veins using the atriotomy incision anteriorly and a cryo lesion posteriorly. All cryo lesions were for three minutes. Then cryo lesions were created to join the pulmonary vein lesions superiorly and inferiorly creating a posterior box.  A cryo lesion was placed between the inferior lesion that joined the pulmonary veins and the posterior mitral annulus at P2-3. A lesion was placed externally across the coronary sinus overlapping this. The left atrium was closed with two layers of continuous 3-0 prolene suture. The right atrium was opened obliquely  An RF lesion was created to join the SVC and IVC posteriorly along the interatrial septum.  These internal/external lesions were two firings and two applications for each lesion.  A cryo lesion was placed from the anterior aspect of this oblique atriotomy down to the tricuspid annulus. An RF lesion was placed obliquely across the RAA. The right atriotomy was then closed with two layers of 4-0 prolene suture.    Mitral Valve Replacement:   Valve inspection showed markedly thickened leaflets with a small orifice and no annular calcification. The valve looked rheumatic.   There was a large flail segment of the posterior leaflet comprising all The valve leaflets and chordae were resected leaving some of the thinner posterior chords attached to the posterior leaflet at the annulus. Most of the valve was resected since it was so thick and I was concerned about obstructing the new valve if they were preserved.  A series of pledgetted 2-0 Ethibond sutures were placed around the mitral annulus. A 27 mm Edwards MITRIS pericardial valve was chosen ( model 11400M, SN [id number]). The sutures were placed through the sewing ring and it was lowered into place. The sutures were tied using CorKnots.  The atrium was closed with 2 layers of continuous 3-0 prolene suture.    Completion:  The patient was rewarmed to 37 degrees Centigrade. A dose of warm reanimation cardioplegia was given. De-airing maneuvers were performed and the head placed in trendelenburg position. The crossclamp was removed with a time of 130 minutes. There was no spontaneous rhythm.  Two temporary epicardial pacing wires were placed on the right atrium and two on the right ventricle. DDD pacing was initiated. The retrograde cardioplegia  cannula was removed. The patient was weaned from CPB without difficulty on no inotropes. CPB time was 154 minutes. Cardiac output was 4 LPM. Heparin was fully reversed with protamine and the aortic and venous cannulas removed. Hemostasis was achieved. Mediastinal drainage tubes were placed. The sternum was closed  with #6 stainless steel wires. The fascia was closed with continuous # 1 vicryl suture. The subcutaneous tissue was closed with 2-0 vicryl continuous suture. The skin was closed with 3-0 vicryl subcuticular suture. All sponge, needle, and instrument counts were reported correct at the end of the case. Dry sterile dressings were placed over the incisions and around the chest tubes which were connected to pleurevac suction. The patient was then transported to the surgical intensive care unit in stable condition.

## 2021-07-17 NOTE — Anesthesia Procedure Notes (Signed)
Central Venous Catheter Insertion Performed by: Marcene Duos, MD, anesthesiologist Start/End5/19/2023 6:35 AM, 07/17/2021 6:45 AM Patient location: Pre-op. Preanesthetic checklist: patient identified, IV checked, site marked, risks and benefits discussed, surgical consent, monitors and equipment checked, pre-op evaluation, timeout performed and anesthesia consent Hand hygiene performed  and maximum sterile barriers used  PA cath was placed.Swan type:thermodilution PA Cath depth:50 Procedure performed without using ultrasound guided technique. Attempts: 1 Patient tolerated the procedure well with no immediate complications.

## 2021-07-17 NOTE — Anesthesia Procedure Notes (Signed)
Arterial Line Insertion Start/End5/19/2023 7:05 AM Performed by: Audie Pinto, CRNA  Patient location: Pre-op. Preanesthetic checklist: patient identified, IV checked, site marked, risks and benefits discussed, surgical consent and monitors and equipment checked Lidocaine 1% used for infiltration and patient sedated Left, radial was placed Catheter size: 20 G Hand hygiene performed  and maximum sterile barriers used  Allen's test indicative of satisfactory collateral circulation Attempts: 2 Procedure performed without using ultrasound guided technique. Following insertion, dressing applied and Biopatch. Post procedure assessment: unchanged  Patient tolerated the procedure well with no immediate complications.

## 2021-07-17 NOTE — Discharge Instructions (Addendum)
Discharge Instructions:  1. You may shower, please wash incisions daily with soap and water and keep dry.  If you wish to cover wounds with dressing you may do so but please keep clean and change daily.  No tub baths or swimming until incisions have completely healed.  If your incisions become red or develop any drainage please call our office at [phone]  2. No Driving until cleared by Dr. Bartle's office and you are no longer using narcotic pain medications  3. Monitor your weight daily.. Please use the same scale and weigh at same time... If you gain 5-10 lbs in 48 hours with associated lower extremity swelling, please contact our office at [phone]  4. Fever of 101.5 for at least 24 hours with no source, please contact our office at [phone]  5. Activity- up as tolerated, please walk at least 3 times per day.  Avoid strenuous activity, no lifting, pushing, or pulling with your arms over 8-10 lbs for a minimum of 6 weeks  6. If any questions or concerns arise, please do not hesitate to contact our office at [phone]    Information on my medicine - Coumadin   (Warfarin)  Why was Coumadin prescribed for you? Coumadin was prescribed for you because you have a blood clot or a medical condition that can cause an increased risk of forming blood clots. Blood clots can cause serious health problems by blocking the flow of blood to the heart, lung, or brain. Coumadin can prevent harmful blood clots from forming. As a reminder your indication for Coumadin is:  Blood Clot Prevention after Heart Valve Surgery  What test will check on my response to Coumadin? While on Coumadin (warfarin) you will need to have an INR test regularly to ensure that your dose is keeping you in the desired range. The INR (international normalized ratio) number is calculated from the result of the laboratory test called prothrombin time (PT).  If an INR APPOINTMENT HAS NOT ALREADY BEEN MADE FOR YOU please  schedule an appointment to have this lab work done by your health care provider within 7 days. Your INR goal is usually a number between:  2 to 3 or your provider may give you a more narrow range like 2-2.5.  Ask your health care provider during an office visit what your goal INR is.  What  do you need to  know  About  COUMADIN? Take Coumadin (warfarin) exactly as prescribed by your healthcare provider about the same time each day.  DO NOT stop taking without talking to the doctor who prescribed the medication.  Stopping without other blood clot prevention medication to take the place of Coumadin may increase your risk of developing a new clot or stroke.  Get refills before you run out.  What do you do if you miss a dose? If you miss a dose, take it as soon as you remember on the same day then continue your regularly scheduled regimen the next day.  Do not take two doses of Coumadin at the same time.  Important Safety Information A possible side effect of Coumadin (Warfarin) is an increased risk of bleeding. You should call your healthcare provider right away if you experience any of the following: Bleeding from an injury or your nose that does not stop. Unusual colored urine (red or dark brown) or unusual colored stools (red or black). Unusual bruising for unknown reasons. A serious fall or if you hit your head (even if there is no   bleeding).  Some foods or medicines interact with Coumadin (warfarin) and might alter your response to warfarin. To help avoid this: Eat a balanced diet, maintaining a consistent amount of Vitamin K. Notify your provider about major diet changes you plan to make. Avoid alcohol or limit your intake to 1 drink for women and 2 drinks for men per day. (1 drink is 5 oz. wine, 12 oz. beer, or 1.5 oz. liquor.)  Make sure that ANY health care provider who prescribes medication for you knows that you are taking Coumadin (warfarin).  Also make sure the healthcare provider  who is monitoring your Coumadin knows when you have started a new medication including herbals and non-prescription products.  Coumadin (Warfarin)  Major Drug Interactions  Increased Warfarin Effect Decreased Warfarin Effect  Alcohol (large quantities) Antibiotics (esp. Septra/Bactrim, Flagyl, Cipro) Amiodarone (Cordarone) Aspirin (ASA) Cimetidine (Tagamet) Megestrol (Megace) NSAIDs (ibuprofen, naproxen, etc.) Piroxicam (Feldene) Propafenone (Rythmol SR) Propranolol (Inderal) Isoniazid (INH) Posaconazole (Noxafil) Barbiturates (Phenobarbital) Carbamazepine (Tegretol) Chlordiazepoxide (Librium) Cholestyramine (Questran) Griseofulvin Oral Contraceptives Rifampin Sucralfate (Carafate) Vitamin K   Coumadin (Warfarin) Major Herbal Interactions  Increased Warfarin Effect Decreased Warfarin Effect  Garlic Ginseng Ginkgo biloba Coenzyme Q10 Green tea St. John's wort    Coumadin (Warfarin) FOOD Interactions  Eat a consistent number of servings per week of foods HIGH in Vitamin K (1 serving =  cup)  Collards (cooked, or boiled & drained) Kale (cooked, or boiled & drained) Mustard greens (cooked, or boiled & drained) Parsley *serving size only =  cup Spinach (cooked, or boiled & drained) Swiss chard (cooked, or boiled & drained) Turnip greens (cooked, or boiled & drained)  Eat a consistent number of servings per week of foods MEDIUM-HIGH in Vitamin K (1 serving = 1 cup)  Asparagus (cooked, or boiled & drained) Broccoli (cooked, boiled & drained, or raw & chopped) Brussel sprouts (cooked, or boiled & drained) *serving size only =  cup Lettuce, raw (green leaf, endive, romaine) Spinach, raw Turnip greens, raw & chopped   These websites have more information on Coumadin (warfarin):  www.coumadin.com; www.ahrq.gov/consumer/coumadin.htm;   

## 2021-07-17 NOTE — Progress Notes (Signed)
  Echocardiogram Echocardiogram Transesophageal has been performed.  Abigail Butler 07/17/2021, 8:53 AM

## 2021-07-17 NOTE — Progress Notes (Signed)
Pt's head wrap/religious garb was taken off for procedure and given to daughter.   Viviano Simas, RN

## 2021-07-17 NOTE — Anesthesia Preprocedure Evaluation (Signed)
Anesthesia Evaluation  Patient identified by MRN, date of birth, ID band Patient awake    Reviewed: Allergy & Precautions, NPO status , Patient's Chart, lab work & pertinent test results  Airway Mallampati: II  TM Distance: >3 FB Neck ROM: Full    Dental   Pulmonary neg pulmonary ROS,    breath sounds clear to auscultation       Cardiovascular hypertension, Pt. on medications + Valvular Problems/Murmurs (severe MS) MR  Rhythm:Regular Rate:Normal     Neuro/Psych  Neuromuscular disease    GI/Hepatic Neg liver ROS, GERD  ,  Endo/Other  negative endocrine ROS  Renal/GU negative Renal ROS     Musculoskeletal   Abdominal   Peds  Hematology negative hematology ROS (+)   Anesthesia Other Findings   Reproductive/Obstetrics                             Lab Results  Component Value Date   WBC 10.9 (H) 07/15/2021   HGB 12.1 07/15/2021   HCT 38.5 07/15/2021   MCV 88.3 07/15/2021   PLT 317 07/15/2021   Lab Results  Component Value Date   CREATININE 0.58 07/15/2021   BUN 6 07/15/2021   NA 139 07/15/2021   K 4.1 07/15/2021   CL 109 07/15/2021   CO2 22 07/15/2021    Anesthesia Physical Anesthesia Plan  ASA: 4  Anesthesia Plan: General   Post-op Pain Management: Tylenol PO (pre-op)*   Induction: Intravenous  PONV Risk Score and Plan: 3 and Dexamethasone, Ondansetron, Midazolam and Treatment may vary due to age or medical condition  Airway Management Planned: Oral ETT  Additional Equipment: Arterial line, CVP, PA Cath, TEE and Ultrasound Guidance Line Placement  Intra-op Plan:   Post-operative Plan: Post-operative intubation/ventilation  Informed Consent: I have reviewed the patients History and Physical, chart, labs and discussed the procedure including the risks, benefits and alternatives for the proposed anesthesia with the patient or authorized representative who has indicated  his/her understanding and acceptance.     Dental advisory given  Plan Discussed with: CRNA  Anesthesia Plan Comments:         Anesthesia Quick Evaluation

## 2021-07-17 NOTE — Progress Notes (Signed)
RT NOTE:  Cardiac wean initiated.

## 2021-07-17 NOTE — Anesthesia Procedure Notes (Signed)
Central Venous Catheter Insertion Performed by: Roderic Palau, MD, anesthesiologist Start/End5/19/2023 6:35 AM, 07/17/2021 6:45 AM Patient location: Pre-op. Preanesthetic checklist: patient identified, IV checked, site marked, risks and benefits discussed, surgical consent, monitors and equipment checked, pre-op evaluation, timeout performed and anesthesia consent Position: Trendelenburg Lidocaine 1% used for infiltration and patient sedated Hand hygiene performed , maximum sterile barriers used  and Seldinger technique used Catheter size: 9 Fr Total catheter length 10. Central line was placed.MAC introducer Swan type:thermodilution PA Cath depth:50 Procedure performed using ultrasound guided technique. Ultrasound Notes:anatomy identified, needle tip was noted to be adjacent to the nerve/plexus identified, no ultrasound evidence of intravascular and/or intraneural injection and image(s) printed for medical record Attempts: 1 Following insertion, line sutured and dressing applied. Post procedure assessment: blood return through all ports, free fluid flow and no air  Patient tolerated the procedure well with no immediate complications.

## 2021-07-17 NOTE — Hospital Course (Addendum)
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  She was seen in the medical clinic and found to have a heart rate of 150 with electrocardiogram showing atrial flutter with a rate in the 170s.  She was sent to the emergency room for further work-up and was started on diltiazem.  She was admitted and seen by cardiology in consultation.  She had a history of previous cardiac catheterization in 2019 which was normal.  2D echocardiogram on 05/26/2021 showed severe rheumatic mitral stenosis with a mean pressure gradient of 10 mmHg.  There was mild mitral regurgitation.  Ventricular ejection fraction was 55 to 60%.  There is no significant aortic valve disease.  TEE on 05/27/2021 showed a rheumatic appearing mitral valve with severe stenosis with a mean gradient of 10 mmHg and a valve area 1.5 cm by pressure half-time.  The valve area was 1.1 cm by planimetry.  There was a large left atrial appendage thrombus.  There was mild to moderate tricuspid regurgitation and mild aortic insufficiency.  Plans had initially been made for TEE directed cardioversion which was not performed due to the LAA thrombus.  She was started on Coumadin with a Lovenox bridge.  It was felt she would also require intervention on her Mitral Valve, resulting in referral to [facility].  She was evaluated by Dr. Cyndia Bent at which time she continued to have some exertional fatigue and shortness of breath.  She denies any further chest pain or pressure.  She denies dizziness and syncope.  She has had no peripheral edema.  She has not seen a dentist in years.  Her daughter  provided translation as she speaks [place].  It was felt she should undergo replacement of her mitral valve.  The risks and benefits of the procedure were explained to the patient and she was agreeable to proceed.  Hospital Course:  Jassmine Kleve presented to [facility] on 07/17/2021.  The patient was taken to the operating room and underwent Mitral Valve Replacement with a 27 mm Edwards Mitris Bioprosthetic valve and MAZE procedure with clipping of LA Appendage.Marland Kitchen  She was difficult to place a foley catheter and Urology had to be consulted.  They were able to place a coude catheter.  The patient tolerated the procedure without difficulty and was taken to the SICU in stable condition.  Postoperative hospital course:  The patient has remained neurologically intact.  She was extubated without difficulty using standard post cardiac surgical protocols.  She has remained in sinus rhythm and her preoperative medications diltiazem, digoxin and warfarin have been resumed.  She has an expected acute blood loss anemia which is being monitored clinically.  Renal function has remained stable and she is noted to have some expected postoperative volume overload.  Initially she has been treated with intravenous diuretics.  She remains in NSR with occasional PAC's and her pacing wires were removed without difficulty.  She was felt stable for transfer to the progressive care unit on 4E on 07/20/2021.  The patient remained in NSR.  She was mildly hypertensive and his Lopressor was increased to 25 mg BID.  Her coumadin was increased to  7.5 mg daily which was her home dose.  Her INR is 2.0, with a goal range of 2.5-3.0.  She has been evaluated by PT who recommends home therapy which has been arranged.  Her weight had returned to baseline and diuretic therapy was discontinued.  She was hypokalemic and her potassium was supplemented.  She had issues with constipation and nausea.  She was treated with Reglan and mobility was  encouraged. These symptoms improved without further intervention.  The patients potassium level improved to 5.2. All further supplementation was discontinued.  Her incisions are healing without evidence of infection.  Home health arrangements have been made.  She is medically stable for discharge home today.

## 2021-07-17 NOTE — Transfer of Care (Signed)
Immediate Anesthesia Transfer of Care Note  Patient: Abigail Butler  Procedure(s) Performed: MITRAL VALVE REPLACEMENT USING 27 MM MITRIS VALVE.  CRYO AND RADIOFREQUENCY MAZE PROCEDURE. LIGATION OF LEFT ATRIAL APPENDAGE. (Chest) MAZE (Chest) TRANSESOPHAGEAL ECHOCARDIOGRAM (TEE)  Patient Location: ICU  Anesthesia Type:General  Level of Consciousness: Patient remains intubated per anesthesia plan  Airway & Oxygen Therapy: Patient placed on Ventilator (see vital sign flow sheet for setting)  Post-op Assessment: Report given to RN and Post -op Vital signs reviewed and stable  Post vital signs: Reviewed and stable  Last Vitals:  Vitals Value Taken Time  BP 124/67 (aline) 07/17/21 1423  Temp 36.8 C 07/17/21 1435  Pulse 89 07/17/21 1435  Resp 18 07/17/21 1435  SpO2 94 % 07/17/21 1435  Vitals shown include unvalidated device data.  Last Pain:  Vitals:   07/17/21 0607  TempSrc:   PainSc: 0-No pain         Complications: No notable events documented.

## 2021-07-17 NOTE — Anesthesia Procedure Notes (Addendum)
Procedure Name: Intubation Date/Time: 07/17/2021 7:58 AM Performed by: Janene Harvey, CRNA Pre-anesthesia Checklist: Patient identified, Emergency Drugs available, Suction available and Patient being monitored Patient Re-evaluated:Patient Re-evaluated prior to induction Oxygen Delivery Method: Circle system utilized Preoxygenation: Pre-oxygenation with 100% oxygen Induction Type: IV induction Ventilation: Mask ventilation without difficulty and Oral airway inserted - appropriate to patient size Laryngoscope Size: Glidescope and 3 Grade View: Grade I Tube type: Oral Tube size: 8.0 mm Number of attempts: 1 Airway Equipment and Method: Stylet and Oral airway Placement Confirmation: ETT inserted through vocal cords under direct vision, positive ETCO2 and breath sounds checked- equal and bilateral Secured at: 21 cm Tube secured with: Tape Dental Injury: Teeth and Oropharynx as per pre-operative assessment  Difficulty Due To: Difficulty was anticipated, Difficult Airway- due to reduced neck mobility and Difficult Airway- due to limited oral opening Comments: Elective glidescope

## 2021-07-17 NOTE — Anesthesia Postprocedure Evaluation (Signed)
Anesthesia Post Note  Patient: Abigail Butler  Procedure(s) Performed: MITRAL VALVE REPLACEMENT USING 27 MM MITRIS VALVE.  CRYO AND RADIOFREQUENCY MAZE PROCEDURE. LIGATION OF LEFT ATRIAL APPENDAGE. (Chest) MAZE (Chest) TRANSESOPHAGEAL ECHOCARDIOGRAM (TEE)     Patient location during evaluation: SICU Anesthesia Type: General Level of consciousness: sedated Pain management: pain level controlled Vital Signs Assessment: post-procedure vital signs reviewed and stable Respiratory status: patient remains intubated per anesthesia plan Cardiovascular status: stable Postop Assessment: no apparent nausea or vomiting Anesthetic complications: no   No notable events documented.  Last Vitals:  Vitals:   07/17/21 1655 07/17/21 1700  BP:    Pulse: 87 87  Resp: 16 17  Temp: 37.2 C 37.3 C  SpO2: 100% 100%    Last Pain:  Vitals:   07/17/21 1700  TempSrc: Core  PainSc:                  Kennieth Rad

## 2021-07-17 NOTE — Procedures (Signed)
Extubation Procedure Note Cardiac wean complete NIF -24 VC 735 mls  Cuff leak + No stridor ABG + Pt extubated to 2L [place] Daughter @ bedside to provide translation.    Patient Details:   Name: Abigail Butler DOB: 1966-01-19 MRN: [id number]   Airway Documentation:    Vent end date: 07/17/21 Vent end time: 2055   Evaluation  O2 sats: stable throughout Complications: No apparent complications Patient did tolerate procedure well. Bilateral Breath Sounds: Rhonchi   Yes  Lianne Bushy 07/17/2021, 9:01 PM

## 2021-07-17 NOTE — Brief Op Note (Signed)
07/17/2021  9:26 AM  PATIENT:  Abigail Butler  56 y.o. female  PRE-OPERATIVE DIAGNOSIS:  SEVERE MITRAL STENOSIS ATRIAL FIBRILLATION  POST-OPERATIVE DIAGNOSIS:  SEVERE MITRAL STENOSIS ATRIAL FIBRILLATION  PROCEDURE:  Procedure(s):  MITRAL VALVE REPLACEMENT  -27 mm [employer] Mitris Bioprosthetic Valve  MAZE (N/A) -Radiofrequency and Cryotherapy ablation -Clipping of LA Appendage with a 35 mm Atricure Pro 2 Clip  TRANSESOPHAGEAL ECHOCARDIOGRAM (TEE) (N/A)  SURGEON:  Surgeon(s) and Role:    * Bartle, Fernande Boyden, MD - Primary  PHYSICIAN ASSISTANT: Ellwood Handler PA-C  ASSISTANTS: Vernie Murders RNFA   ANESTHESIA:   general  EBL:  1035 mL   BLOOD ADMINISTERED:   CC CELLSAVER  DRAINS:  Mediastinal Chest Drains    LOCAL MEDICATIONS USED:  NONE  SPECIMEN:  Source of Specimen:  Mitral Valve Leaflets  DISPOSITION OF SPECIMEN:  PATHOLOGY  COUNTS:  YES  TOURNIQUET:  * No tourniquets in log *  DICTATION: .Dragon Dictation  PLAN OF CARE: Admit to inpatient   PATIENT DISPOSITION:  ICU - intubated and hemodynamically stable.   Delay start of Pharmacological VTE agent (>24hrs) due to surgical blood loss or risk of bleeding: yes

## 2021-07-18 ENCOUNTER — Inpatient Hospital Stay (HOSPITAL_COMMUNITY): Payer: Self-pay

## 2021-07-18 LAB — CBC
HCT: 27 % — ABNORMAL LOW (ref 36.0–46.0)
HCT: 27.4 % — ABNORMAL LOW (ref 36.0–46.0)
Hemoglobin: 8.9 g/dL — ABNORMAL LOW (ref 12.0–15.0)
Hemoglobin: 8.3 g/dL — ABNORMAL LOW (ref 12.0–15.0)
MCH: 28.9 pg (ref 26.0–34.0)
MCH: 27.5 pg (ref 26.0–34.0)
MCHC: 33 g/dL (ref 30.0–36.0)
MCHC: 30.3 g/dL (ref 30.0–36.0)
MCV: 87.7 fL (ref 80.0–100.0)
MCV: 90.7 fL (ref 80.0–100.0)
Platelets: 139 10*3/uL — ABNORMAL LOW (ref 150–400)
Platelets: 145 10*3/uL — ABNORMAL LOW (ref 150–400)
RBC: 3.08 MIL/uL — ABNORMAL LOW (ref 3.87–5.11)
RBC: 3.02 MIL/uL — ABNORMAL LOW (ref 3.87–5.11)
RDW: 13.1 % (ref 11.5–15.5)
RDW: 13.3 % (ref 11.5–15.5)
WBC: 16.3 10*3/uL — ABNORMAL HIGH (ref 4.0–10.5)
WBC: 18.1 10*3/uL — ABNORMAL HIGH (ref 4.0–10.5)
nRBC: 0 % (ref 0.0–0.2)
nRBC: 0.2 % (ref 0.0–0.2)

## 2021-07-18 LAB — GLUCOSE, CAPILLARY
Glucose-Capillary: 134 mg/dL — ABNORMAL HIGH (ref 70–99)
Glucose-Capillary: 122 mg/dL — ABNORMAL HIGH (ref 70–99)
Glucose-Capillary: 143 mg/dL — ABNORMAL HIGH (ref 70–99)
Glucose-Capillary: 132 mg/dL — ABNORMAL HIGH (ref 70–99)
Glucose-Capillary: 130 mg/dL — ABNORMAL HIGH (ref 70–99)
Glucose-Capillary: 139 mg/dL — ABNORMAL HIGH (ref 70–99)
Glucose-Capillary: 119 mg/dL — ABNORMAL HIGH (ref 70–99)
Glucose-Capillary: 133 mg/dL — ABNORMAL HIGH (ref 70–99)
Glucose-Capillary: 128 mg/dL — ABNORMAL HIGH (ref 70–99)
Glucose-Capillary: 135 mg/dL — ABNORMAL HIGH (ref 70–99)
Glucose-Capillary: 161 mg/dL — ABNORMAL HIGH (ref 70–99)
Glucose-Capillary: 168 mg/dL — ABNORMAL HIGH (ref 70–99)

## 2021-07-18 LAB — BASIC METABOLIC PANEL
Anion gap: 7 (ref 5–15)
Anion gap: 6 (ref 5–15)
BUN: 8 mg/dL (ref 6–20)
BUN: 10 mg/dL (ref 6–20)
CO2: 21 mmol/L — ABNORMAL LOW (ref 22–32)
CO2: 24 mmol/L (ref 22–32)
Calcium: 6.9 mg/dL — ABNORMAL LOW (ref 8.9–10.3)
Calcium: 7 mg/dL — ABNORMAL LOW (ref 8.9–10.3)
Chloride: 109 mmol/L (ref 98–111)
Chloride: 106 mmol/L (ref 98–111)
Creatinine, Ser: 0.68 mg/dL (ref 0.44–1.00)
Creatinine, Ser: 0.73 mg/dL (ref 0.44–1.00)
GFR, Estimated: >60 mL/min (ref >60)
GFR, Estimated: >60 mL/min (ref >60)
Glucose, Bld: 137 mg/dL — ABNORMAL HIGH (ref 70–99)
Glucose, Bld: 130 mg/dL — ABNORMAL HIGH (ref 70–99)
Potassium: 4.3 mmol/L (ref 3.5–5.1)
Potassium: 3.9 mmol/L (ref 3.5–5.1)
Sodium: 137 mmol/L (ref 135–145)
Sodium: 136 mmol/L (ref 135–145)

## 2021-07-18 LAB — MAGNESIUM
Magnesium: 2.7 mg/dL — ABNORMAL HIGH (ref 1.7–2.4)
Magnesium: 2.6 mg/dL — ABNORMAL HIGH (ref 1.7–2.4)

## 2021-07-18 MED ORDER — FUROSEMIDE 10 MG/ML IJ SOLN: 40.0000 mg | Freq: Once | INTRAMUSCULAR | Status: DC

## 2021-07-18 MED ORDER — ALBUMIN HUMAN 5 % IV SOLN
INTRAVENOUS | Status: AC
  Administered 2021-07-18: 12.5 g via INTRAVENOUS
  Filled 2021-07-18: qty 250

## 2021-07-18 MED ORDER — METOPROLOL TARTRATE 12.5 MG HALF TABLET
12.5000 mg | ORAL_TABLET | Freq: Two times a day (BID) | ORAL | Status: DC
Start: 2021-07-18 — End: 2021-07-21
  Administered 2021-07-18 – 2021-07-20 (×6): 12.5 mg via ORAL
  Filled 2021-07-18 (×6): qty 1

## 2021-07-18 MED ORDER — INSULIN DETEMIR 100 UNIT/ML ~~LOC~~ SOLN
25.0000 [IU] | Freq: Two times a day (BID) | SUBCUTANEOUS | Status: DC
  Administered 2021-07-18 – 2021-07-19 (×4): 25 [IU] via SUBCUTANEOUS
  Filled 2021-07-18 (×6): qty 0.25

## 2021-07-18 MED ORDER — FUROSEMIDE 10 MG/ML IJ SOLN
40.0000 mg | Freq: Once | INTRAMUSCULAR | Status: AC
  Administered 2021-07-18: 40 mg via INTRAVENOUS
  Filled 2021-07-18: qty 4

## 2021-07-18 MED ORDER — FUROSEMIDE 10 MG/ML IJ SOLN
40.0000 mg | Freq: Two times a day (BID) | INTRAMUSCULAR | Status: DC
  Administered 2021-07-18: 40 mg via INTRAVENOUS
  Filled 2021-07-18: qty 4

## 2021-07-18 MED ORDER — INSULIN ASPART 100 UNIT/ML IJ SOLN
0.0000 [IU] | INTRAMUSCULAR | Status: DC
  Administered 2021-07-18: 4 [IU] via SUBCUTANEOUS
  Administered 2021-07-18: 2 [IU] via SUBCUTANEOUS
  Administered 2021-07-18: 4 [IU] via SUBCUTANEOUS
  Administered 2021-07-18: 2 [IU] via SUBCUTANEOUS

## 2021-07-18 MED ORDER — ALBUMIN HUMAN 5 % IV SOLN
12.5000 g | Freq: Once | INTRAVENOUS | Status: AC
Start: 2021-07-18 — End: 2021-07-18

## 2021-07-18 NOTE — Progress Notes (Signed)
1 Day Post-Op Procedure(s) (LRB): MITRAL VALVE REPLACEMENT USING 27 MM MITRIS VALVE.  CRYO AND RADIOFREQUENCY MAZE PROCEDURE. LIGATION OF LEFT ATRIAL APPENDAGE. (N/A) MAZE (N/A) TRANSESOPHAGEAL ECHOCARDIOGRAM (TEE) (N/A) Subjective: Daughter at bedside, c/o incisional pain and cough  Objective: Vital signs in last 24 hours: Temp:  [98.2 F (36.8 C)-100.2 F (37.9 C)] 100 F (37.8 C) (05/20 0900) Pulse Rate:  [72-90] 79 (05/20 0900) Cardiac Rhythm: Normal sinus rhythm (05/20 0900) Resp:  [12-35] 18 (05/20 0900) BP: (96-128)/(52-73) 109/61 (05/20 0900) SpO2:  [89 %-100 %] 93 % (05/20 0900) Arterial Line BP: (89-148)/(43-67) 118/43 (05/20 0900) FiO2 (%):  [40 %-50 %] 40 % (05/19 2020) Weight:  [82.7 kg-86.2 kg] 86.2 kg (05/20 0400)  Hemodynamic parameters for last 24 hours: PAP: (23-43)/(7-26) 31/12 CVP:  [4 mmHg-90 mmHg] 8 mmHg CO:  [2.6 L/min-3.7 L/min] 3.4 L/min CI:  [1.4 L/min/m2-2 L/min/m2] 1.8 L/min/m2  Intake/Output from previous day: 05/19 0701 - 05/20 0700 In: 6328 [P.O.:150; I.V.:3339.8; Blood:596; NG/GT:60; IV Piggyback:2182.3] Out: 3150 [Urine:1795; Blood:1035; Chest Tube:320] Intake/Output this shift: Total I/O In: 143.3 [P.O.:60; I.V.:83.3] Out: 535 [Urine:455; Chest Tube:80]  General appearance: alert, cooperative, and mild distress Neurologic: intact Heart: regular rate and rhythm Lungs: diminished breath sounds bibasilar Abdomen: normal findings: soft, non-tender  Lab Results: Recent Labs    07/17/21 1412 07/17/21 1454 07/17/21 2050 07/18/21 0305  WBC 20.4*  --   --  16.3*  HGB 10.2*   < > 8.8* 8.9*  HCT 32.3*   < > 26.0* 27.0*  PLT 161  --   --  139*   < > = values in this interval not displayed.   BMET:  Recent Labs    07/15/21 1630 07/17/21 0805 07/17/21 1303 07/17/21 1306 07/17/21 2050 07/18/21 0305  NA 139   < > 139   < > 140 137  K 4.1   < > 4.4   < > 4.4 4.3  CL 109   < > 103  --   --  109  CO2 22  --   --   --   --  21*   GLUCOSE 99   < > 154*  --   --  137*  BUN 6   < > 7  --   --  8  CREATININE 0.58   < > 0.40*  --   --  0.68  CALCIUM 9.0  --   --   --   --  6.9*   < > = values in this interval not displayed.    PT/INR:  Recent Labs    07/17/21 1412  LABPROT 18.7*  INR 1.6*   ABG    Component Value Date/Time   PHART 7.325 (L) 07/17/2021 2050   HCO3 22.7 07/17/2021 2050   TCO2 24 07/17/2021 2050   ACIDBASEDEF 3.0 (H) 07/17/2021 2050   O2SAT 95 07/17/2021 2050   CBG (last 3)  Recent Labs    07/18/21 0301 07/18/21 0403 07/18/21 0513  GLUCAP 132* 130* 139*    Assessment/Plan: S/P Procedure(s) (LRB): MITRAL VALVE REPLACEMENT USING 27 MM MITRIS VALVE.  CRYO AND RADIOFREQUENCY MAZE PROCEDURE. LIGATION OF LEFT ATRIAL APPENDAGE. (N/A) MAZE (N/A) TRANSESOPHAGEAL ECHOCARDIOGRAM (TEE) (N/A) POD # 1 MVR, Maze NEURO- intact CV- in SR at 68  Was on Dilt and Dig as outpatient   Will start low dose metoprolol  Coumadin as outpatient - will restart tomorrow  Dc Swan and A line RESP- IS for atelectasis  CXR severely rotated some pulmonary  edema RENAL- creatinine and OK  Diurese ENDO- CBG controlled with insulin drip  Transition to levemir + SSI Gi- diet as toelrated Anemia secondary to ABL- monitor Dc chest tubes Mobilize   LOS: 1 day    Melrose Nakayama 07/18/2021

## 2021-07-18 NOTE — Progress Notes (Signed)
      [address]       [place] [address]             [phone]      Sleeping  BP 128/66 (BP Location: Right Arm)   Pulse 78   Temp 100 F (37.8 C) (Core)   Resp (!) 23   Ht 5\' 4"  (1.626 m)   Wt 86.2 kg   SpO2 94%   BMI 32.62 kg/m   Intake/Output Summary (Last 24 hours) at 07/18/2021 1803 Last data filed at 07/18/2021 1700 Gross per 24 hour  Intake 2538.66 ml  Output 1620 ml  Net 918.66 ml   K= 3.9, creatinine 0.73 Hct 27  Just received PM dose of Lasix  07/20/2021 C. Viviann Spare, MD [facility] [phone]

## 2021-07-19 ENCOUNTER — Inpatient Hospital Stay (HOSPITAL_COMMUNITY): Payer: Self-pay

## 2021-07-19 ENCOUNTER — Encounter (HOSPITAL_COMMUNITY): Payer: Self-pay | Admitting: Surgery

## 2021-07-19 LAB — BASIC METABOLIC PANEL
Anion gap: 4 — ABNORMAL LOW (ref 5–15)
BUN: 9 mg/dL (ref 6–20)
CO2: 27 mmol/L (ref 22–32)
Calcium: 7.3 mg/dL — ABNORMAL LOW (ref 8.9–10.3)
Chloride: 104 mmol/L (ref 98–111)
Creatinine, Ser: 0.74 mg/dL (ref 0.44–1.00)
GFR, Estimated: >60 mL/min (ref >60)
Glucose, Bld: 114 mg/dL — ABNORMAL HIGH (ref 70–99)
Potassium: 3.9 mmol/L (ref 3.5–5.1)
Sodium: 135 mmol/L (ref 135–145)

## 2021-07-19 LAB — CBC
HCT: 25.7 % — ABNORMAL LOW (ref 36.0–46.0)
Hemoglobin: 7.9 g/dL — ABNORMAL LOW (ref 12.0–15.0)
MCH: 27.8 pg (ref 26.0–34.0)
MCHC: 30.7 g/dL (ref 30.0–36.0)
MCV: 90.5 fL (ref 80.0–100.0)
Platelets: 143 10*3/uL — ABNORMAL LOW (ref 150–400)
RBC: 2.84 MIL/uL — ABNORMAL LOW (ref 3.87–5.11)
RDW: 13.3 % (ref 11.5–15.5)
WBC: 19.4 10*3/uL — ABNORMAL HIGH (ref 4.0–10.5)
nRBC: 0.5 % — ABNORMAL HIGH (ref 0.0–0.2)

## 2021-07-19 LAB — GLUCOSE, CAPILLARY
Glucose-Capillary: 119 mg/dL — ABNORMAL HIGH (ref 70–99)
Glucose-Capillary: 115 mg/dL — ABNORMAL HIGH (ref 70–99)
Glucose-Capillary: 117 mg/dL — ABNORMAL HIGH (ref 70–99)
Glucose-Capillary: 128 mg/dL — ABNORMAL HIGH (ref 70–99)
Glucose-Capillary: 127 mg/dL — ABNORMAL HIGH (ref 70–99)
Glucose-Capillary: 104 mg/dL — ABNORMAL HIGH (ref 70–99)

## 2021-07-19 MED ORDER — METOCLOPRAMIDE HCL 5 MG/5ML PO SOLN
10.0000 mg | Freq: Three times a day (TID) | ORAL | Status: AC
Start: 2021-07-19 — End: 2021-07-20
  Administered 2021-07-19 – 2021-07-20 (×4): 10 mg via ORAL
  Filled 2021-07-19 (×4): qty 10

## 2021-07-19 MED ORDER — PROCHLORPERAZINE EDISYLATE 10 MG/2ML IJ SOLN
10.0000 mg | Freq: Four times a day (QID) | INTRAMUSCULAR | Status: DC | PRN
  Administered 2021-07-19 (×2): 10 mg via INTRAVENOUS
  Filled 2021-07-19 (×3): qty 2

## 2021-07-19 MED ORDER — WARFARIN SODIUM 5 MG PO TABS
5.0000 mg | ORAL_TABLET | Freq: Every day | ORAL | Status: DC
  Administered 2021-07-19 – 2021-07-20 (×2): 5 mg via ORAL
  Filled 2021-07-19 (×2): qty 1

## 2021-07-19 MED ORDER — ASPIRIN 81 MG PO CHEW
81.0000 mg | CHEWABLE_TABLET | Freq: Every day | ORAL | Status: DC
  Administered 2021-07-19 – 2021-07-25 (×7): 81 mg via ORAL
  Filled 2021-07-19 (×7): qty 1

## 2021-07-19 MED ORDER — INSULIN ASPART 100 UNIT/ML IJ SOLN
0.0000 [IU] | Freq: Three times a day (TID) | INTRAMUSCULAR | Status: DC
  Administered 2021-07-19 – 2021-07-20 (×3): 2 [IU] via SUBCUTANEOUS

## 2021-07-19 MED ORDER — FUROSEMIDE 10 MG/ML IJ SOLN
40.0000 mg | Freq: Two times a day (BID) | INTRAMUSCULAR | Status: DC
Start: 2021-07-19 — End: 2021-07-20
  Administered 2021-07-19 (×2): 40 mg via INTRAVENOUS
  Filled 2021-07-19 (×2): qty 4

## 2021-07-19 MED ORDER — WARFARIN - PHYSICIAN DOSING INPATIENT
Freq: Every day | Status: DC
Start: 2021-07-19 — End: 2021-07-25

## 2021-07-19 NOTE — Progress Notes (Signed)
2 Days Post-Op Procedure(s) (LRB): MITRAL VALVE REPLACEMENT USING 27 MM MITRIS VALVE.  CRYO AND RADIOFREQUENCY MAZE PROCEDURE. LIGATION OF LEFT ATRIAL APPENDAGE. (N/A) MAZE (N/A) TRANSESOPHAGEAL ECHOCARDIOGRAM (TEE) (N/A) Subjective: C/o nausea, poor appetite  Objective: Vital signs in last 24 hours: Temp:  [98.7 F (37.1 C)-100 F (37.8 C)] 98.8 F (37.1 C) (05/21 0800) Pulse Rate:  [75-82] 77 (05/21 0800) Cardiac Rhythm: Normal sinus rhythm;Heart block (05/21 0800) Resp:  [10-32] 21 (05/21 0800) BP: (111-141)/(51-69) 119/62 (05/21 0800) SpO2:  [91 %-97 %] 95 % (05/21 0800) Arterial Line BP: (135-151)/(50-55) 151/55 (05/20 1110) Weight:  [87.5 kg] 87.5 kg (05/21 0500)  Hemodynamic parameters for last 24 hours: PAP: (30-38)/(15-17) 36/17 CVP:  [13 mmHg-15 mmHg] 15 mmHg CO:  [3.6 L/min-3.7 L/min] 3.7 L/min CI:  [1.9 L/min/m2-2 L/min/m2] 2 L/min/m2  Intake/Output from previous day: 05/20 0701 - 05/21 0700 In: 1021.2 [P.O.:400; I.V.:271.2; IV Piggyback:350] Out: 1556 [Urine:1405; Emesis/NG output:1; Chest Tube:150] Intake/Output this shift: Total I/O In: 210 [I.V.:10; IV Piggyback:200] Out: 45 [Urine:45]  General appearance: alert, cooperative, and no distress Neurologic: no focal weakness Heart: regular rate and rhythm Lungs: diminished breath sounds bibasilar Abdomen: mildly distended, nontender, hypoactive BS  Lab Results: Recent Labs    07/18/21 1700 07/19/21 0325  WBC 18.1* 19.4*  HGB 8.3* 7.9*  HCT 27.4* 25.7*  PLT 145* 143*   BMET:  Recent Labs    07/18/21 1700 07/19/21 0325  NA 136 135  K 3.9 3.9  CL 106 104  CO2 24 27  GLUCOSE 130* 114*  BUN 10 9  CREATININE 0.73 0.74  CALCIUM 7.0* 7.3*    PT/INR:  Recent Labs    07/17/21 1412  LABPROT 18.7*  INR 1.6*   ABG    Component Value Date/Time   PHART 7.325 (L) 07/17/2021 2050   HCO3 22.7 07/17/2021 2050   TCO2 24 07/17/2021 2050   ACIDBASEDEF 3.0 (H) 07/17/2021 2050   O2SAT 95 07/17/2021  2050   CBG (last 3)  Recent Labs    07/19/21 0329 07/19/21 0652 07/19/21 0820  GLUCAP 119* 115* 117*    Assessment/Plan: S/P Procedure(s) (LRB): MITRAL VALVE REPLACEMENT USING 27 MM MITRIS VALVE.  CRYO AND RADIOFREQUENCY MAZE PROCEDURE. LIGATION OF LEFT ATRIAL APPENDAGE. (N/A) MAZE (N/A) TRANSESOPHAGEAL ECHOCARDIOGRAM (TEE) (N/A) POD  # 2  Slow progress NEURO- intact CV- in SR  Was on warfarin preop- will resume  Decrease ASA to 81 mg RESP- on 3 L [place], good sats, atelectasis + effusions on CXR  IS, diurese RENAL- creatinine OK  Continue IV diuresis, Keep Foley catheter in with anatomy + IV Lasix ENDO- CBG well controlled Gi- poor PO intake, nausea  Reglan, Zofran + compazine PRN Anemia secondary to ABL- monitor Mobilize  LOS: 2 days    Loreli Slot 07/19/2021

## 2021-07-19 NOTE — Evaluation (Addendum)
Physical Therapy Evaluation Patient Details Name: Abigail Butler MRN: [id number] DOB: 11-Mar-1965 Today's Date: 07/19/2021  History of Present Illness  Pt is a 56 y.o. F who presents s/p mitral valve replacement 07/17/2021. Significant PMH: HTN, remote CVA, latent TB, newly diagnosed severe mitral stenosis with atrial fibrillation and atrial flutter.  Clinical Impression  PTA, pt lives with her daughter in a second floor apartment and is independent. Pt drowsy upon evaluation but agreeable to participate. Pt ambulating 150 ft with a walker at a min assist level; SpO2 91-95% on 2L O2, HR 78-91 bpm. Pt displays weakness, impaired balance and decreased activity tolerance. Will continue to progress mobility as tolerated.  [employer] Arabic interpreter Ahmed [phone] utilized for this session     Recommendations for follow up therapy are one component of a multi-disciplinary discharge planning process, led by the attending physician.  Recommendations may be updated based on patient status, additional functional criteria and insurance authorization.  Follow Up Recommendations Home health PT    Assistance Recommended at Discharge PRN  Patient can return home with the following  A little help with walking and/or transfers;A little help with bathing/dressing/bathroom;Assist for transportation;Help with stairs or ramp for entrance;Assistance with cooking/housework    Equipment Recommendations Rolling walker (2 wheels)  Recommendations for Other Services       Functional Status Assessment Patient has had a recent decline in their functional status and demonstrates the ability to make significant improvements in function in a reasonable and predictable amount of time.     Precautions / Restrictions Precautions Precautions: Fall;Sternal Precaution Booklet Issued: No Restrictions Weight Bearing Restrictions: Yes RUE Weight Bearing: Non weight bearing LUE Weight Bearing: Non weight bearing Other  Position/Activity Restrictions: sternal precautions      Mobility  Bed Mobility Overal bed mobility: Needs Assistance Bed Mobility: Supine to Sit, Sit to Supine     Supine to sit: Min assist Sit to supine: Min assist   General bed mobility comments: Use of sternal pillow, minA for trunk elevation, increased time to progress to sitting EOB.    Transfers Overall transfer level: Needs assistance Equipment used:  [place] walker) Transfers: Sit to/from Stand Sit to Stand: Min assist           General transfer comment: MinA to rise and steady from edge of bed, holding onto sternal pillow    Ambulation/Gait Ambulation/Gait assistance: Min assist Gait Distance (Feet): 150 Feet Assistive device: Ethelene Hal Gait Pattern/deviations: Step-through pattern, Decreased stride length Gait velocity: decreased     General Gait Details: Light minA for dynamic stability  Stairs            Wheelchair Mobility    Modified Rankin (Stroke Patients Only)       Balance Overall balance assessment: Mild deficits observed, not formally tested                                           Pertinent Vitals/Pain Pain Assessment Pain Assessment: 0-10 Pain Score: 1  Pain Location: incisional Pain Descriptors / Indicators: Headache, Operative site guarding Pain Intervention(s): Monitored during session    Home Living Family/patient expects to be discharged to:: Private residence Living Arrangements: Children (daughter) Available Help at Discharge: Family;Available PRN/intermittently Type of Home: Apartment Home Access: Stairs to enter Entrance Stairs-Rails: Right;Left Entrance Stairs-Number of Steps:  (flight (second floor))   Home Layout: One level Home Equipment: None  Prior Function Prior Level of Function : Independent/Modified Independent                     Hand Dominance   Dominant Hand: Right    Extremity/Trunk Assessment   Upper  Extremity Assessment Upper Extremity Assessment: Overall WFL for tasks assessed    Lower Extremity Assessment Lower Extremity Assessment: Overall WFL for tasks assessed       Communication   Communication: Prefers language other than English (Arabic)  Cognition Arousal/Alertness: Awake/alert Behavior During Therapy: WFL for tasks assessed/performed Overall Cognitive Status: Difficult to assess                                 General Comments: Follows commands with increased time        General Comments      Exercises     Assessment/Plan    PT Assessment Patient needs continued PT services  PT Problem List Decreased strength;Decreased activity tolerance;Decreased balance;Decreased mobility       PT Treatment Interventions DME instruction;Gait training;Stair training;Functional mobility training;Therapeutic activities;Therapeutic exercise;Balance training;Patient/family education    PT Goals (Current goals can be found in the Care Plan section)  Acute Rehab PT Goals Patient Stated Goal: did not state PT Goal Formulation: With patient Time For Goal Achievement: 08/02/21 Potential to Achieve Goals: Good    Frequency Min 3X/week     Co-evaluation               AM-PAC PT "6 Clicks" Mobility  Outcome Measure Help needed turning from your back to your side while in a flat bed without using bedrails?: A Little Help needed moving from lying on your back to sitting on the side of a flat bed without using bedrails?: A Little Help needed moving to and from a bed to a chair (including a wheelchair)?: A Little Help needed standing up from a chair using your arms (e.g., wheelchair or bedside chair)?: A Little Help needed to walk in hospital room?: A Little Help needed climbing 3-5 steps with a railing? : A Lot 6 Click Score: 17    End of Session Equipment Utilized During Treatment: Oxygen Activity Tolerance: Patient tolerated treatment well Patient  left: in bed;with call bell/phone within reach;with family/visitor present Nurse Communication: Mobility status PT Visit Diagnosis: Unsteadiness on feet (R26.81);Difficulty in walking, not elsewhere classified (R26.2)    Time: [id number] PT Time Calculation (min) (ACUTE ONLY): 31 min   Charges:   PT Evaluation $PT Eval Moderate Complexity: 1 Mod PT Treatments $Therapeutic Activity: 8-22 mins        Wyona Almas, PT, DPT Acute Rehabilitation Services Pager [phone] Office [phone]   Deno Etienne 07/19/2021, 3:16 PM

## 2021-07-19 NOTE — Progress Notes (Signed)
      [address]       [place] [address]             [phone]      POD # 2 mitral Maze  Went into A fib with VR in 70s this afternoon  BP (!) 108/54 (BP Location: Right Arm)   Pulse 79   Temp 98.8 F (37.1 C) (Oral)   Resp 12   Ht 5\' 4"  (1.626 m)   Wt 87.5 kg   SpO2 91%   BMI 33.11 kg/m    Intake/Output Summary (Last 24 hours) at 07/19/2021 1941 Last data filed at 07/19/2021 1800 Gross per 24 hour  Intake 768 ml  Output 1436 ml  Net -668 ml   Did very well with ambulation today  Remo Lipps C. Roxan Hockey, MD [facility] [phone]

## 2021-07-20 ENCOUNTER — Encounter (HOSPITAL_COMMUNITY): Payer: Self-pay | Admitting: Surgery

## 2021-07-20 ENCOUNTER — Inpatient Hospital Stay (HOSPITAL_COMMUNITY): Payer: Self-pay

## 2021-07-20 ENCOUNTER — Other Ambulatory Visit: Payer: Self-pay | Admitting: Student

## 2021-07-20 DIAGNOSIS — Z952 Presence of prosthetic heart valve: Secondary | ICD-10-CM

## 2021-07-20 LAB — CBC
HCT: 27.7 % — ABNORMAL LOW (ref 36.0–46.0)
Hemoglobin: 8.6 g/dL — ABNORMAL LOW (ref 12.0–15.0)
MCH: 27.6 pg (ref 26.0–34.0)
MCHC: 31 g/dL (ref 30.0–36.0)
MCV: 88.8 fL (ref 80.0–100.0)
Platelets: 186 10*3/uL (ref 150–400)
RBC: 3.12 MIL/uL — ABNORMAL LOW (ref 3.87–5.11)
RDW: 13.3 % (ref 11.5–15.5)
WBC: 18.7 10*3/uL — ABNORMAL HIGH (ref 4.0–10.5)
nRBC: 1.3 % — ABNORMAL HIGH (ref 0.0–0.2)

## 2021-07-20 LAB — PROTIME-INR
INR: 1.3 — ABNORMAL HIGH (ref 0.8–1.2)
Prothrombin Time: 16.3 seconds — ABNORMAL HIGH (ref 11.4–15.2)

## 2021-07-20 LAB — BASIC METABOLIC PANEL
Anion gap: 9 (ref 5–15)
BUN: 9 mg/dL (ref 6–20)
CO2: 26 mmol/L (ref 22–32)
Calcium: 8.1 mg/dL — ABNORMAL LOW (ref 8.9–10.3)
Chloride: 102 mmol/L (ref 98–111)
Creatinine, Ser: 0.66 mg/dL (ref 0.44–1.00)
GFR, Estimated: >60 mL/min (ref >60)
Glucose, Bld: 112 mg/dL — ABNORMAL HIGH (ref 70–99)
Potassium: 3.8 mmol/L (ref 3.5–5.1)
Sodium: 137 mmol/L (ref 135–145)

## 2021-07-20 LAB — MAGNESIUM: Magnesium: 2.6 mg/dL — ABNORMAL HIGH (ref 1.7–2.4)

## 2021-07-20 LAB — GLUCOSE, CAPILLARY
Glucose-Capillary: 113 mg/dL — ABNORMAL HIGH (ref 70–99)
Glucose-Capillary: 123 mg/dL — ABNORMAL HIGH (ref 70–99)
Glucose-Capillary: 104 mg/dL — ABNORMAL HIGH (ref 70–99)
Glucose-Capillary: 131 mg/dL — ABNORMAL HIGH (ref 70–99)

## 2021-07-20 LAB — SURGICAL PATHOLOGY

## 2021-07-20 MED ORDER — SODIUM CHLORIDE 0.9% FLUSH: 3.0000 mL | INTRAVENOUS | Status: DC | PRN

## 2021-07-20 MED ORDER — FE FUMARATE-B12-VIT C-FA-IFC PO CAPS
1.0000 | ORAL_CAPSULE | Freq: Two times a day (BID) | ORAL | Status: DC
  Administered 2021-07-20 – 2021-07-25 (×11): 1 via ORAL
  Filled 2021-07-20 (×11): qty 1

## 2021-07-20 MED ORDER — TRAMADOL HCL 50 MG PO TABS
50.0000 mg | ORAL_TABLET | ORAL | Status: DC | PRN
  Administered 2021-07-20 – 2021-07-24 (×8): 50 mg via ORAL
  Filled 2021-07-20 (×9): qty 1

## 2021-07-20 MED ORDER — POTASSIUM CHLORIDE CRYS ER 20 MEQ PO TBCR
20.0000 meq | EXTENDED_RELEASE_TABLET | Freq: Two times a day (BID) | ORAL | Status: DC
  Administered 2021-07-20 – 2021-07-21 (×3): 20 meq via ORAL
  Filled 2021-07-20 (×3): qty 1

## 2021-07-20 MED ORDER — FUROSEMIDE 40 MG PO TABS
40.0000 mg | ORAL_TABLET | Freq: Every day | ORAL | Status: DC
  Administered 2021-07-20 – 2021-07-22 (×3): 40 mg via ORAL
  Filled 2021-07-20 (×3): qty 1

## 2021-07-20 MED ORDER — METOLAZONE 2.5 MG PO TABS
2.5000 mg | ORAL_TABLET | Freq: Once | ORAL | Status: AC
Start: 2021-07-20 — End: 2021-07-20
  Administered 2021-07-20: 2.5 mg via ORAL
  Filled 2021-07-20: qty 1

## 2021-07-20 MED ORDER — SODIUM CHLORIDE 0.9% FLUSH
3.0000 mL | Freq: Two times a day (BID) | INTRAVENOUS | Status: DC
  Administered 2021-07-20 – 2021-07-25 (×10): 3 mL via INTRAVENOUS

## 2021-07-20 MED ORDER — SODIUM CHLORIDE 0.9 % IV SOLN: 250.0000 mL | INTRAVENOUS | Status: DC | PRN

## 2021-07-20 MED ORDER — ~~LOC~~ CARDIAC SURGERY, PATIENT & FAMILY EDUCATION: Freq: Once | Status: AC

## 2021-07-20 MED FILL — Lidocaine HCl Local Preservative Free (PF) Inj 2%: INTRAMUSCULAR | Qty: 15 | Status: AC

## 2021-07-20 MED FILL — Potassium Chloride Inj 2 mEq/ML: INTRAVENOUS | Qty: 40 | Status: AC

## 2021-07-20 MED FILL — Thrombin (Recombinant) For Soln 20000 Unit: CUTANEOUS | Qty: 1 | Status: AC
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
## 2021-07-20 NOTE — Progress Notes (Signed)
   07/20/21 2212  Oxygen Therapy  SpO2 (!) 86 %  O2 Device Room Air (placed on 1 L)    Pt sats drop as low at 86% on room air with sleep. Place on 1L, current sat 94%

## 2021-07-20 NOTE — Progress Notes (Signed)
Ordered 6 week post-op Echo following mitral valve replacement per protocol. This will be read by a structural reader.  Primary Cardiologist: Dr. Geraldo Pitter CT Surgeon: Dr. Cyndia Bent.  Darreld Mclean, PA-C 07/20/2021 1:20 PM

## 2021-07-20 NOTE — Progress Notes (Signed)
3 Days Post-Op Procedure(s) (LRB): MITRAL VALVE REPLACEMENT USING 27 MM MITRIS VALVE.  CRYO AND RADIOFREQUENCY MAZE PROCEDURE. LIGATION OF LEFT ATRIAL APPENDAGE. (N/A) MAZE (N/A) TRANSESOPHAGEAL ECHOCARDIOGRAM (TEE) (N/A) Subjective:  Says she is sleepy all the time. Reportedly walked better this am. Not eating much. Had small BM.  Objective: Vital signs in last 24 hours: Temp:  [98.5 F (36.9 C)-99.3 F (37.4 C)] 99.3 F (37.4 C) (05/22 0400) Pulse Rate:  [68-90] 81 (05/22 0600) Cardiac Rhythm: Atrial fibrillation (05/22 0400) Resp:  [10-23] 10 (05/22 0600) BP: (108-142)/(49-88) 120/72 (05/22 0600) SpO2:  [90 %-97 %] 96 % (05/22 0600) Weight:  [87.1 kg] 87.1 kg (05/22 0500)  Hemodynamic parameters for last 24 hours:    Intake/Output from previous day: 05/21 0701 - 05/22 0700 In: 943 [P.O.:725; I.V.:18; IV Piggyback:200] Out: 1552 [Urine:1550; Stool:2] Intake/Output this shift: No intake/output data recorded.  General appearance: sleepy Neurologic: intact Heart: regular rate and rhythm with frequent extra beats,  S1, S2 normal, no murmur Lungs: diminished breath sounds bibasilar Extremities: no edema Wound: dressing dry  Lab Results: Recent Labs    07/19/21 0325 07/20/21 0656  WBC 19.4* 18.7*  HGB 7.9* 8.6*  HCT 25.7* 27.7*  PLT 143* 186   BMET:  Recent Labs    07/18/21 1700 07/19/21 0325  NA 136 135  K 3.9 3.9  CL 106 104  CO2 24 27  GLUCOSE 130* 114*  BUN 10 9  CREATININE 0.73 0.74  CALCIUM 7.0* 7.3*    PT/INR:  Recent Labs    07/20/21 0656  LABPROT 16.3*  INR 1.3*   ABG    Component Value Date/Time   PHART 7.325 (L) 07/17/2021 2050   HCO3 22.7 07/17/2021 2050   TCO2 24 07/17/2021 2050   ACIDBASEDEF 3.0 (H) 07/17/2021 2050   O2SAT 95 07/17/2021 2050   CBG (last 3)  Recent Labs    07/19/21 1606 07/19/21 2131 07/20/21 0645  GLUCAP 127* 104* 113*   CXR: bilateral LL atelectasis.  Assessment/Plan: S/P Procedure(s) (LRB): MITRAL  VALVE REPLACEMENT USING 27 MM MITRIS VALVE.  CRYO AND RADIOFREQUENCY MAZE PROCEDURE. LIGATION OF LEFT ATRIAL APPENDAGE. (N/A) MAZE (N/A) TRANSESOPHAGEAL ECHOCARDIOGRAM (TEE) (N/A)  POD 3 Hemodynamically stable. Went into rate controlled atrial fib yesterday but this am appears to be sinus with PAC's at times. Will check ECG. Continue low dose Lopressor.  Volume excess: wt still 10 lbs up. Will continue lasix and give a dose of metolazone this am.  Glucose under good control. DC Levemir and continue SSI. Preop Hgb A1c 6.3 on no meds and no hx of DM.  Expected acute postop blood loss anemia: Hgb improved. Start iron.  INR 1.3. Continue Coumadin 5 mg daily. DC pacing wires.  Continue IS, add flutter valve, ambulate.  Transfer to 4E   LOS: 3 days    Alleen Borne 07/20/2021

## 2021-07-20 NOTE — Evaluation (Signed)
Occupational Therapy Evaluation Patient Details Name: Abigail Butler MRN: [id number] DOB: [birth year] Today's Date: 07/20/2021   History of Present Illness Pt is a 56 y.o. F who presents s/p mitral valve replacement 07/17/2021. Significant PMH: HTN, remote CVA, latent TB, newly diagnosed severe mitral stenosis with atrial fibrillation and atrial flutter.   Clinical Impression   PTA pt lives independently with her daughter. Ablet o progress OOB and ambulate @ 75 ft @ RW level; requires Mod A with LB ADL and Max A with UB dressing. Began education regarding sternal precautions with pt/daughters.  VSS on RA throughout session. Encouraged use of incentive spirometer as pt only pulling @ 250 ml. Acute OT to follow to facilitate safe DC home with assistance daughter, who states "she needs a note from the doctor to excuse her from work". - CM notified.      Recommendations for follow up therapy are one component of a multi-disciplinary discharge planning process, led by the attending physician.  Recommendations may be updated based on patient status, additional functional criteria and insurance authorization.   Follow Up Recommendations  No OT follow up    Assistance Recommended at Discharge Frequent or constant Supervision/Assistance  Patient can return home with the following A little help with walking and/or transfers;A little help with bathing/dressing/bathroom;Assistance with cooking/housework;Assist for transportation;Help with stairs or ramp for entrance    Functional Status Assessment  Patient has had a recent decline in their functional status and demonstrates the ability to make significant improvements in function in a reasonable and predictable amount of time.  Equipment Recommendations  BSC/3in1    Recommendations for Other Services       Precautions / Restrictions Precautions Precautions: Fall;Sternal Precaution Booklet Issued: No Precaution Comments: pacing  wires Restrictions Weight Bearing Restrictions: Yes RUE Weight Bearing: Non weight bearing LUE Weight Bearing: Non weight bearing Other Position/Activity Restrictions: sternal precautions      Mobility Bed Mobility Overal bed mobility: Needs Assistance Bed Mobility: Supine to Sit, Sit to Supine     Supine to sit: Min assist Sit to supine: Min assist   General bed mobility comments: Use of sternal pillow, minA for trunk elevation, increased time to progress to sitting EOB.    Transfers Overall transfer level: Needs assistance Equipment used:  Carley Hammed walker) Transfers: Sit to/from Stand Sit to Stand: Min assist           General transfer comment: MinA to rise and steady from edge of bed, holding onto sternal pillow      Balance Overall balance assessment: Mild deficits observed, not formally tested                                         ADL either performed or assessed with clinical judgement   ADL Overall ADL's : Needs assistance/impaired Eating/Feeding: Set up   Grooming: Set up;Supervision/safety   Upper Body Bathing: Minimal assistance   Lower Body Bathing: Moderate assistance   Upper Body Dressing : Maximal assistance   Lower Body Dressing: Maximal assistance   Toilet Transfer: Minimal assistance;Rolling walker (2 wheels);Ambulation   Toileting- Clothing Manipulation and Hygiene: Moderate assistance       Functional mobility during ADLs: Minimal assistance;Rolling walker (2 wheels)       Vision         Perception     Praxis      Pertinent Vitals/Pain Pain Assessment Pain Assessment:  Faces Faces Pain Scale: Hurts a little bit Pain Location: incisional Pain Descriptors / Indicators: Operative site guarding Pain Intervention(s): Limited activity within patient's tolerance     Hand Dominance Right   Extremity/Trunk Assessment Upper Extremity Assessment Upper Extremity Assessment: Generalized weakness   Lower  Extremity Assessment Lower Extremity Assessment: Defer to PT evaluation   Cervical / Trunk Assessment Cervical / Trunk Assessment: Other exceptions (protective posture from surgery)   Communication Communication Communication: Prefers language other than English (Arabic)   Cognition Arousal/Alertness: Awake/alert Behavior During Therapy: WFL for tasks assessed/performed Overall Cognitive Status: Difficult to assess                                 General Comments: Follows commands with increased time     General Comments       Exercises Exercises: Other exercises Other Exercises Other Exercises: incentive sprometer - only able to pull 250ml Other Exercises: educated daughters on importance of being OOB or in chair position   Shoulder Instructions      Home Living Family/patient expects to be discharged to:: Private residence Living Arrangements: Children (daughter) Available Help at Discharge: Family;Available PRN/intermittently Type of Home: Apartment Home Access: Stairs to enter Entergy CorporationEntrance Stairs-Number of Steps:  (flight (second floor)) Entrance Stairs-Rails: Right;Left Home Layout: One level     Bathroom Shower/Tub: Chief Strategy OfficerTub/shower unit   Bathroom Toilet: Standard Bathroom Accessibility: No   Home Equipment: None          Prior Functioning/Environment Prior Level of Function : Independent/Modified Independent             Mobility Comments: independent in home wihtout assist or AD ADLs Comments: independent        OT Problem List: Decreased strength;Decreased activity tolerance;Decreased range of motion;Decreased safety awareness;Decreased knowledge of use of DME or AE;Decreased knowledge of precautions;Cardiopulmonary status limiting activity;Obesity;Pain      OT Treatment/Interventions: Self-care/ADL training;Therapeutic exercise;Energy conservation;DME and/or AE instruction;Therapeutic activities;Patient/family education;Balance training     OT Goals(Current goals can be found in the care plan section) Acute Rehab OT Goals Patient Stated Goal: to get better OT Goal Formulation: With patient Time For Goal Achievement: 08/03/21 Potential to Achieve Goals: Good  OT Frequency: Min 3X/week    Co-evaluation              AM-PAC OT "6 Clicks" Daily Activity     Outcome Measure Help from another person eating meals?: A Little Help from another person taking care of personal grooming?: A Little Help from another person toileting, which includes using toliet, bedpan, or urinal?: A Lot Help from another person bathing (including washing, rinsing, drying)?: A Lot Help from another person to put on and taking off regular upper body clothing?: A Lot Help from another person to put on and taking off regular lower body clothing?: A Lot 6 Click Score: 14   End of Session Equipment Utilized During Treatment: Gait belt;Rolling walker (2 wheels) Nurse Communication: Mobility status  Activity Tolerance: Patient tolerated treatment well Patient left: in bed;with call bell/phone within reach;with family/visitor present (modified chair position)  OT Visit Diagnosis: Unsteadiness on feet (R26.81);Muscle weakness (generalized) (M62.81);Pain Pain - part of body:  (chest - incisional)                Time: [id number] OT Time Calculation (min): 32 min Charges:  OT General Charges $OT Visit: 1 Visit OT Evaluation $OT Eval Moderate Complexity: 1 Mod OT  Treatments $Self Care/Home Management : 8-22 mins  Luisa Dago, OT/L   Acute OT Clinical Specialist Acute Rehabilitation Services Pager [phone] Office [phone]   [facility] 07/20/2021, 2:49 PM

## 2021-07-20 NOTE — Progress Notes (Signed)
Patient did well getting up to wheelchair with assistance. VSS. Report called to 4E, Corporate treasurer. Transported to stepdown unit.

## 2021-07-21 ENCOUNTER — Inpatient Hospital Stay (HOSPITAL_COMMUNITY): Payer: Self-pay

## 2021-07-21 LAB — GLUCOSE, CAPILLARY
Glucose-Capillary: 115 mg/dL — ABNORMAL HIGH (ref 70–99)
Glucose-Capillary: 134 mg/dL — ABNORMAL HIGH (ref 70–99)
Glucose-Capillary: 139 mg/dL — ABNORMAL HIGH (ref 70–99)

## 2021-07-21 LAB — ECHO INTRAOPERATIVE TEE
AV Mean grad: 2 mmHg
AV Peak grad: 4.6 mmHg
Ao pk vel: 1.07 m/s
P 1/2 time: 178 msec

## 2021-07-21 LAB — PROTIME-INR
INR: 1.3 — ABNORMAL HIGH (ref 0.8–1.2)
Prothrombin Time: 16.3 seconds — ABNORMAL HIGH (ref 11.4–15.2)

## 2021-07-21 MED ORDER — METOLAZONE 5 MG PO TABS
2.5000 mg | ORAL_TABLET | Freq: Once | ORAL | Status: AC
  Administered 2021-07-21: 2.5 mg via ORAL
  Filled 2021-07-21: qty 1

## 2021-07-21 MED ORDER — METOPROLOL TARTRATE 25 MG PO TABS
25.0000 mg | ORAL_TABLET | Freq: Two times a day (BID) | ORAL | Status: DC
Start: 2021-07-21 — End: 2021-07-25
  Administered 2021-07-21 – 2021-07-25 (×9): 25 mg via ORAL
  Filled 2021-07-21 (×9): qty 1

## 2021-07-21 MED ORDER — KETOROLAC TROMETHAMINE 15 MG/ML IJ SOLN
15.0000 mg | Freq: Four times a day (QID) | INTRAMUSCULAR | Status: AC
Start: 2021-07-21 — End: 2021-07-22
  Administered 2021-07-21 – 2021-07-22 (×3): 15 mg via INTRAVENOUS
  Filled 2021-07-21 (×3): qty 1

## 2021-07-21 MED ORDER — FUROSEMIDE 10 MG/ML IJ SOLN
40.0000 mg | Freq: Once | INTRAMUSCULAR | Status: AC
  Administered 2021-07-21: 40 mg via INTRAVENOUS
  Filled 2021-07-21: qty 4

## 2021-07-21 MED ORDER — WARFARIN SODIUM 7.5 MG PO TABS
7.5000 mg | ORAL_TABLET | Freq: Every day | ORAL | Status: DC
  Administered 2021-07-21 – 2021-07-24 (×4): 7.5 mg via ORAL
  Filled 2021-07-21 (×4): qty 1

## 2021-07-21 MED ORDER — POTASSIUM CHLORIDE 10 MEQ/100ML IV SOLN
10.0000 meq | INTRAVENOUS | Status: AC
  Administered 2021-07-21: 10 meq via INTRAVENOUS
  Filled 2021-07-21 (×2): qty 100

## 2021-07-21 MED ORDER — POTASSIUM CHLORIDE CRYS ER 20 MEQ PO TBCR: 20.0000 meq | EXTENDED_RELEASE_TABLET | Freq: Once | ORAL | Status: DC

## 2021-07-21 NOTE — Progress Notes (Signed)
Pacing wires pulled per order. Tips intact. Interpreter used to ensure understanding of procedure. Patient verbalized understanding. Daughter present in the room. Vitals taken and stable. Educated on 1 hour bedrest. Patient verbalized understanding.  Abigail Butler

## 2021-07-21 NOTE — Progress Notes (Signed)
Started IV potassium around 1930- pt was asleep for about 20 minutes before waking up and c/o of IV burning "it's so hot, very very hot". Continued to complain even at decreased rates. Ran 1 bag total. She would fall asleep off and on, and daughter states every time she would wake up, she complained again before falling back asleep.   When trying to hang a new bag, pt was adamant about not running another one. Her daughter inquired about trying the pill again.  Per day shift RN, pt got choked and threw up her morning dose. I instead dissolved her evening dose in water and pt tolerated fine- did not throw up pill. Also ate a small amount of dinner. So in total got 30 meq potassium today.

## 2021-07-21 NOTE — Progress Notes (Signed)
Patient called out saying she was having trouble breathing. Patient also expressed she has been extremely sleepy and cant stay awake, hasn't been able to eat, and feels off. RN assessed patient and vitals stable at this time. Heart sounds clear and lung sounds diminished. RN paged on call PA to inform.  Abigail Butler

## 2021-07-21 NOTE — Progress Notes (Signed)
   07/21/21 1615  Clinical Encounter Type  Visited With Patient and family together  Visit Type Follow-up;Other (Comment) (Advanced Directive)  Referral From Nurse  Consult/Referral To Chaplain   Chaplain responded to a spiritual consult for advanced directive education.   Patient, Abigail Butler, was not feeling well at the time of my visit. Her daughter Charlott Rakes was present at bedside so I shared the information with her. Aida stated that they would probably work the paperwork together at home when Romayne is discharged.   Valerie Roys [facility]  [phone]

## 2021-07-21 NOTE — Progress Notes (Signed)
   07/21/21 6269  Mobility  Activity Ambulated with assistance in hallway  Range of Motion/Exercises Active;All extremities  Level of Assistance Modified independent, requires aide device or extra time  Assistive Device Four wheel walker (eva walker)  Distance Ambulated (ft) 900 ft  Activity Response Tolerated fair    Pt walked entire length of hall and back this AM using eva walker. X1 sitting break. HR 90s. O2 96 on room air. Pt tolerated well. Assisted back into bed.

## 2021-07-21 NOTE — Final Progress Note (Addendum)
      [address]       [name] [address]             [phone]      07/21/21 17:15 Called to check on Abigail Butler by RN for complaint of difficulty breathing. Abigail Butler was seen with her daughter at the bedside who provided language interpretation. Abigail Butler expressed that she did not feel she could get a deep breath.  She also nauseated and had some epigastric discomfort.  Monitor shows a normal sinus rhythm. Oxygen saturation is 96 to 99% while she is on room air.  On exam, General: She is sleepy but awakens easily and participates in the interview.  Vital signs have been stable throughout the day. Chest: She is mildly tachypneic with respiratory rate around 24 to 26/min.  Respirations are nonlabored.  There are fine crackles posteriorly bilaterally.  Breath sounds are clear anteriorly.  Sternotomy incision is dry and intact Abdomen: Soft, nontender Extremities: Mild lower extremity edema  The PA and lateral chest x-ray this morning showed small bilateral effusions that are improving, bibasilar volume loss due to atelectasis and "stable mild to moderate congestive heart failure".  She is receiving daily p.o. Lasix and metolazone but the nurse reports she has been unable to keep accurate I&O because the Abigail Butler has been incontinent of urine several times.  The nurse also reports the Abigail Butler vomited right after receiving the K-Dur this morning so she has not had any potassium supplement today.     Assessment / Plan-- Mr. Aybar does not appear to be in respiratory distress although he has significant atelectasis and at least small bilateral pleural effusions that are restricting her breathing.  I discussed the importance of the incentive spirometry and flutter valve.  She also needs to do more walking.  I will give her an extra dose of Lasix IV 40 mg x 1 this evening.  By the KCl also ordered since she was unable to take the oral potassium earlier today.  Recheck BMP in  the morning.  Jillyn Hidden, PA-C   Chart reviewed, Abigail Butler examined, agree with above. I just saw her and daughter tells me the same story. She looks uncomfortable but the daughter denies that she is having pain. I will order a few doses of Toradol to see if that will make her more comfortable so she can work on IS and mobilize. She had a lot of atelectasis on CXR this am.

## 2021-07-21 NOTE — Progress Notes (Addendum)
      [address]       [place] [address]             [phone]      4 Days Post-Op Procedure(s) (LRB): MITRAL VALVE REPLACEMENT USING 27 MM MITRIS VALVE.  CRYO AND RADIOFREQUENCY MAZE PROCEDURE. LIGATION OF LEFT ATRIAL APPENDAGE. (N/A) MAZE (N/A) TRANSESOPHAGEAL ECHOCARDIOGRAM (TEE) (N/A)  Subjective:  Patients daughter at bedside to provide translation.  Patient is doing well w/o complaints.  She is ambulating w/o difficulty.  + BM  Objective: Vital signs in last 24 hours: Temp:  [98.7 F (37.1 C)-99.2 F (37.3 C)] 98.7 F (37.1 C) (05/23 0311) Pulse Rate:  [80-96] 87 (05/23 0311) Cardiac Rhythm: Heart block (05/22 1907) Resp:  [8-19] 14 (05/23 0311) BP: (132-151)/(56-69) 136/56 (05/23 0311) SpO2:  [86 %-98 %] 90 % (05/23 0311) Weight:  [86.1 kg] 86.1 kg (05/23 0204)  Intake/Output from previous day: 05/22 0701 - 05/23 0700 In: 200 [P.O.:200] Out: 140 [Urine:140]  General appearance: alert, cooperative, and no distress Heart: regular rate and rhythm Lungs: clear to auscultation bilaterally Abdomen: soft, non-tender; bowel sounds normal; no masses,  no organomegaly Extremities: edema trace Wound: clean and dry  Lab Results: Recent Labs    07/19/21 0325 07/20/21 0656  WBC 19.4* 18.7*  HGB 7.9* 8.6*  HCT 25.7* 27.7*  PLT 143* 186   BMET:  Recent Labs    07/19/21 0325 07/20/21 0656  NA 135 137  K 3.9 3.8  CL 104 102  CO2 27 26  GLUCOSE 114* 112*  BUN 9 9  CREATININE 0.74 0.66  CALCIUM 7.3* 8.1*    PT/INR:  Recent Labs    07/21/21 0404  LABPROT 16.3*  INR 1.3*   ABG    Component Value Date/Time   PHART 7.325 (L) 07/17/2021 2050   HCO3 22.7 07/17/2021 2050   TCO2 24 07/17/2021 2050   ACIDBASEDEF 3.0 (H) 07/17/2021 2050   O2SAT 95 07/17/2021 2050   CBG (last 3)  Recent Labs    07/20/21 1746 07/20/21 2059 07/21/21 0622  GLUCAP 104* 131* 115*    Assessment/Plan: S/P Procedure(s) (LRB): MITRAL VALVE REPLACEMENT  USING 27 MM MITRIS VALVE.  CRYO AND RADIOFREQUENCY MAZE PROCEDURE. LIGATION OF LEFT ATRIAL APPENDAGE. (N/A) MAZE (N/A) TRANSESOPHAGEAL ECHOCARDIOGRAM (TEE) (N/A)  CV- NSR, mild HTN- continue Lopressor, will discuss need for additional agent with Dr. Cyndia Bent INR 1.3, will increase coumadin to home regimen of 7.5 mg daily Pulm- off oxygen, continue IS Renal- creatinine has been stable, weight is trending down, will continue Lasix, potassium today Deconditioning- mild, PT recs home PT, orders have been placed, will also order walker CBGs controlled, stop SSIP Dispo- patient stable, will discuss need for anti-hypertensive agent with Dr. Cyndia Bent, remains in NSR, increase coumadin to home regimen, if patient continues to progress anticipate discharge later this week   LOS: 4 days    Ellwood Handler, PA-C 07/21/2021   Chart reviewed, patient examined, agree with above.

## 2021-07-21 NOTE — Progress Notes (Addendum)
Physical Therapy Treatment Patient Details Name: Abigail Butler MRN: [id number] DOB: 05/05/1965 Today's Date: 07/21/2021   History of Present Illness Pt is a 56 y.o. F who presents s/p mitral valve replacement 07/17/2021. Significant PMH: HTN, remote CVA, latent TB, newly diagnosed severe mitral stenosis with atrial fibrillation and atrial flutter.    PT Comments    Pt received in recliner, hunched forward over heart pillow and most of the way forward out of the seat onto reclined portion of chair, pt daughter present. Pt lethargic throughout session and frequently closing eyes, but following simple 1-step commands most of session. Pt instructed on fall risk prevention and need to return to supine if unable to sit up safely in chair to rest, RN notified of pt lethargy. Pt reports concern of limb "heaviness" and racing heart, however per monitor HR stable with resting and transfers/stairs and SpO2 WFL on RA with mobility. Pt needing up to minA for transfers without UE support and gait at bedside/stair sequencing with heavy cues for activity pacing/safety. Pt remains lethargic after stair negotiation so pt returned to supine with min guard. Reinforced sternal precautions with patient/daughter, pt daughter translating. Pt continues to benefit from PT services to progress toward functional mobility goals.    Recommendations for follow up therapy are one component of a multi-disciplinary discharge planning process, led by the attending physician.  Recommendations may be updated based on patient status, additional functional criteria and insurance authorization.  Follow Up Recommendations  Home health PT     Assistance Recommended at Discharge PRN  Patient can return home with the following A little help with walking and/or transfers;A little help with bathing/dressing/bathroom;Assist for transportation;Help with stairs or ramp for entrance;Assistance with cooking/housework   Equipment Recommendations   Rolling walker (2 wheels)    Recommendations for Other Services       Precautions / Restrictions Precautions Precautions: Fall;Sternal Precaution Booklet Issued: No Precaution Comments: pacing wires Restrictions Weight Bearing Restrictions: No Other Position/Activity Restrictions: sternal precautions     Mobility  Bed Mobility Overal bed mobility: Needs Assistance Bed Mobility: Sit to Supine       Sit to supine: Min guard   General bed mobility comments: Use of sternal pillow, cues for not propping on elbow to lower trunk; min guard for safety    Transfers Overall transfer level: Needs assistance Equipment used: None Transfers: Sit to/from Stand Sit to Stand: Min assist, Min guard           General transfer comment: from chair, x10 reps, pt needing mostly min guard but ~2 reps minA due to fatigue. Pt hugging heart pillow each attempt but also given demo for performing with hands in lap if needed for stability.    Ambulation/Gait Ambulation/Gait assistance: Min assist Gait Distance (Feet): 10 Feet Assistive device: None Gait Pattern/deviations: Step-to pattern       General Gait Details: stepping at bedside to/from steps for transfer training with stairs, pt too lethargic to safely progress gait, falling asleep while resting in chair afterward so defer longer gait trial; per RN note ~6am, pt had ambulated entire hallway with EVA walker (~46770ft although that RN note reported 98600ft)   Stairs Stairs: Yes Stairs assistance: Min assist Stair Management: Forwards, Step to pattern, With walker Number of Stairs: 10 General stair comments: RW to simulate handrails, pt daughter reports she has one railing but when asked which side it was on, L/R, she does not answer x2 prompts so used B handrails for safety. Pt mildly  unsteady with slow pace to ascend/descend and minA for safety/stability but no significant LOB. Pt's daughter reports 14 steps to second floor, will plan to  assess this next session in stairwell if pt more alert       Balance Overall balance assessment: Mild deficits observed, not formally tested (static standing unsupported no LOB but minA for safety with pivotal transfers and short gait task at bedside without AD)             Cognition Arousal/Alertness: Lethargic Behavior During Therapy: Flat affect Overall Cognitive Status: Difficult to assess      General Comments: Follows commands with increased time, pt appearing lethargic and frequently closing eyes between instructions; therapist requested pt to get back to supine after session as she was slumping and sliding off end of chair at beginning and middle of session while resting. Daughter present to translate for her. Pt then asking why she can't get back to chair once in bed. Unclear if pt with confusion/memory deficit or if daughter had not fully translated therapist statements. Pt c/o "heaviness" and chest "feeling different" but no chest pain reported; RN notified.        Exercises Other Exercises Other Exercises: incentive spirometer - only able to pull x15 reps; verbal/visual demo for posture and improved technique but pt continues to perform small/fast inspirations Other Exercises: STS x 10 reps holding heart pillow (no UE support) and min guard to minA for stability/rise    General Comments General comments (skin integrity, edema, etc.): BP 147/69 (90) supine post-exertion; HR 90-95 bpm with rest and standing tasks, SpO2 95-98% on RA with mobility.      Pertinent Vitals/Pain Pain Assessment Pain Assessment: Faces Faces Pain Scale: Hurts little more Pain Location: incisional Pain Descriptors / Indicators: Operative site guarding, Heaviness, Grimacing Pain Intervention(s): Limited activity within patient's tolerance, Monitored during session, Repositioned, Ice applied (ice pack given, instruction to remove after 15-20 mins at most on/off)           PT Goals  (current goals can now be found in the care plan section) Acute Rehab PT Goals Patient Stated Goal: did not state PT Goal Formulation: With patient Time For Goal Achievement: 08/02/21 Progress towards PT goals: Progressing toward goals (slow progress due to lethargy)    Frequency    Min 3X/week      PT Plan Current plan remains appropriate       AM-PAC PT "6 Clicks" Mobility   Outcome Measure  Help needed turning from your back to your side while in a flat bed without using bedrails?: A Little Help needed moving from lying on your back to sitting on the side of a flat bed without using bedrails?: A Little Help needed moving to and from a bed to a chair (including a wheelchair)?: A Little Help needed standing up from a chair using your arms (e.g., wheelchair or bedside chair)?: A Little Help needed to walk in hospital room?: A Lot (mod cues due to lethargy) Help needed climbing 3-5 steps with a railing? : A Lot (mod safety cues, reliant on B rails) 6 Click Score: 16    End of Session   Activity Tolerance: Patient limited by lethargy Patient left: in bed;with call bell/phone within reach;with family/visitor present (left bed alarm off as her daughter reports she will stay in room with her; dtr to notify RN if needing to leave so bed alarm can be placed.) Nurse Communication: Mobility status;Other (comment) (pt lethargy) PT Visit Diagnosis:  Unsteadiness on feet (R26.81);Difficulty in walking, not elsewhere classified (R26.2)     Time: 3151-7616 PT Time Calculation (min) (ACUTE ONLY): 37 min  Charges:  $Gait Training: 8-22 mins (stairs) $Therapeutic Exercise: 8-22 mins                     Abiola Behring P., PTA Acute Rehabilitation Services Secure Chat Preferred 9a-5:30pm Office: [phone]    Dorathy Kinsman [facility] 07/21/2021, 12:25 PM

## 2021-07-22 LAB — BASIC METABOLIC PANEL
Anion gap: 12 (ref 5–15)
Anion gap: 13 (ref 5–15)
BUN: 12 mg/dL (ref 6–20)
BUN: 12 mg/dL (ref 6–20)
CO2: 30 mmol/L (ref 22–32)
CO2: 29 mmol/L (ref 22–32)
Calcium: 8.4 mg/dL — ABNORMAL LOW (ref 8.9–10.3)
Calcium: 8.4 mg/dL — ABNORMAL LOW (ref 8.9–10.3)
Chloride: 94 mmol/L — ABNORMAL LOW (ref 98–111)
Chloride: 95 mmol/L — ABNORMAL LOW (ref 98–111)
Creatinine, Ser: 0.69 mg/dL (ref 0.44–1.00)
Creatinine, Ser: 0.69 mg/dL (ref 0.44–1.00)
GFR, Estimated: >60 mL/min (ref >60)
GFR, Estimated: >60 mL/min (ref >60)
Glucose, Bld: 127 mg/dL — ABNORMAL HIGH (ref 70–99)
Glucose, Bld: 127 mg/dL — ABNORMAL HIGH (ref 70–99)
Potassium: 3.2 mmol/L — ABNORMAL LOW (ref 3.5–5.1)
Potassium: 3.2 mmol/L — ABNORMAL LOW (ref 3.5–5.1)
Sodium: 136 mmol/L (ref 135–145)
Sodium: 137 mmol/L (ref 135–145)

## 2021-07-22 LAB — PROTIME-INR
INR: 1.4 — ABNORMAL HIGH (ref 0.8–1.2)
Prothrombin Time: 17.2 seconds — ABNORMAL HIGH (ref 11.4–15.2)

## 2021-07-22 MED ORDER — LACTULOSE 10 GM/15ML PO SOLN: 20.0000 g | Freq: Every day | ORAL | Status: DC | PRN

## 2021-07-22 MED ORDER — ALUM & MAG HYDROXIDE-SIMETH 200-200-20 MG/5ML PO SUSP
30.0000 mL | Freq: Three times a day (TID) | ORAL | Status: DC | PRN
Start: 2021-07-22 — End: 2021-07-25

## 2021-07-22 MED ORDER — POTASSIUM CHLORIDE 20 MEQ PO PACK
20.0000 meq | PACK | Freq: Three times a day (TID) | ORAL | Status: DC
  Administered 2021-07-22 (×3): 20 meq via ORAL
  Filled 2021-07-22 (×3): qty 1

## 2021-07-22 NOTE — Progress Notes (Addendum)
CARDIAC REHAB PHASE I   PRE:  Rate/Rhythm: 88 NSR   BP:  Sitting: 109/88      SaO2: 100  MODE:  Ambulation: 470 ft   Seen pt from 1319-1404 , IS use was reiterated upon arrival, pt only managing 125. Pt was ambulated through hallways with rollator with gait belt for support. Pt stopped for a break after walking 15ft, then walked another 18ft before taking another break and pt walked the back to her room. Pt gait was unsteady and she was SOB throughout ambulation. Post vitals could not be taken b/c pt has to use restroom and daughter took over care.   Faustino Congress  1:57 PM 07/22/2021

## 2021-07-22 NOTE — Progress Notes (Signed)
Physical Therapy Treatment Patient Details Name: Abigail Butler MRN: [id number] DOB: Jun 11, 1965 Today's Date: 07/22/2021   History of Present Illness Pt is a 56 y.o. female admitted 07/17/21 for MVR. PMH includes HTN, latent TB, removed CVA, afib/aflutter, newly dx severe MR stenosis.   PT Comments    Pt progressing with mobility. Today's session focused on transfer and gait training with rollator, pt moving well with intermittent min guard for balance secondary to fatigue and incisional discomfort. Pt requires encouragement to increase OOB mobility, as well as to perform tasks as independently as possible (daughter quick to assist). Noted pt without insurance and may not be able to get DME; will hope to progress gait training without walker.     Recommendations for follow up therapy are one component of a multi-disciplinary discharge planning process, led by the attending physician.  Recommendations may be updated based on patient status, additional functional criteria and insurance authorization.  Follow Up Recommendations  Home health PT     Assistance Recommended at Discharge Intermittent Supervision/Assistance  Patient can return home with the following A little help with bathing/dressing/bathroom;Assistance with cooking/housework;Assist for transportation;Help with stairs or ramp for entrance   Equipment Recommendations  Rollator (4 wheels)    Recommendations for Other Services       Precautions / Restrictions Precautions Precautions: Fall;Sternal     Mobility  Bed Mobility Overal bed mobility: Independent Bed Mobility: Supine to Sit, Sit to Supine           General bed mobility comments: daughter assist pt to come to sitting EOB, encouraged to have pt perform indep. pt able to return to supine in flat bed indep with bed rails, good ability to maintain precautions    Transfers Overall transfer level: Needs assistance Equipment used: Rollator (4 wheels) Transfers: Sit  to/from Stand Sit to Stand: Supervision           General transfer comment: educ on locking/unlocking rollator brakes; multiple sit<>stands from EOB and rollator seat with supervision for safety, standing with hands in lap    Ambulation/Gait Ambulation/Gait assistance: Min guard, Supervision Gait Distance (Feet): 66 Feet (+ 248') Assistive device: Rollator (4 wheels) Gait Pattern/deviations: Step-through pattern, Decreased stride length, Trunk flexed Gait velocity: Decreased     General Gait Details: Slow, labored gait with rollator and initial min guard for balance, progressing to supervision; 1x seated rest break secondary to fatigue; improving stability and upright posture with distance   Stairs             Wheelchair Mobility    Modified Rankin (Stroke Patients Only)       Balance Overall balance assessment: Needs assistance   Sitting balance-Leahy Scale: Good     Standing balance support: No upper extremity supported, During functional activity Standing balance-Leahy Scale: Fair Standing balance comment: can static stand and take steps without UE support; preference for walker                            Cognition Arousal/Alertness: Awake/alert Behavior During Therapy: WFL for tasks assessed/performed Overall Cognitive Status: Within Functional Limits for tasks assessed                                 General Comments: WFL for simple tasks via basic English in addition to daughter interpreting (per pt request); pt with initial flat affect, suspect more related to fatigue and  discomfort, but appreciative of care and stating how "good the hospital workers" are. encouraged pt and daughter to have pt perform mobility tasks as indep as possible        Exercises      General Comments General comments (skin integrity, edema, etc.): pt's daughter present and supportive; SpO2 85-92% on RA with mobility via portable finger pulse ox, HR  92      Pertinent Vitals/Pain Pain Assessment Pain Assessment: Faces Faces Pain Scale: Hurts little more Pain Location: sternal incision Pain Descriptors / Indicators: Operative site guarding, Grimacing Pain Intervention(s): Monitored during session    Home Living                          Prior Function            PT Goals (current goals can now be found in the care plan section) Progress towards PT goals: Progressing toward goals    Frequency    Min 3X/week      PT Plan Equipment recommendations need to be updated    Co-evaluation              AM-PAC PT "6 Clicks" Mobility   Outcome Measure  Help needed turning from your back to your side while in a flat bed without using bedrails?: None Help needed moving from lying on your back to sitting on the side of a flat bed without using bedrails?: None Help needed moving to and from a bed to a chair (including a wheelchair)?: A Little Help needed standing up from a chair using your arms (e.g., wheelchair or bedside chair)?: A Little Help needed to walk in hospital room?: A Little Help needed climbing 3-5 steps with a railing? : A Lot 6 Click Score: 19    End of Session Equipment Utilized During Treatment: Gait belt Activity Tolerance: Patient tolerated treatment well;Patient limited by fatigue Patient left: with call bell/phone within reach;with family/visitor present Nurse Communication: Mobility status PT Visit Diagnosis: Unsteadiness on feet (R26.81);Difficulty in walking, not elsewhere classified (R26.2)     Time: [id number] PT Time Calculation (min) (ACUTE ONLY): 15 min  Charges:  $Gait Training: 8-22 mins                     [name], PT, DPT Acute Rehabilitation Services  Pager [phone] Office [place] 07/22/2021, 10:45 AM

## 2021-07-22 NOTE — Progress Notes (Signed)
Mobility Specialist: Progress Note   07/22/21 1722  Mobility  Activity Ambulated with assistance in hallway  Level of Assistance Minimal assist, patient does 75% or more  Assistive Device Four wheel walker  Distance Ambulated (ft) 470 ft (235'x2)  Activity Response Tolerated well  $Mobility charge 1 Mobility   Pre-Mobility: 81 HR, 100% SpO2 Post-Mobility: 87 HR, 100% SpO2  Pt received in the bed and agreeable to ambulation. Stopped x1 for seated break secondary to feeling SOB, otherwise asymptomatic. Pt back to bed after session with call bell at her side and family present in the room.   [facility] Macarthur Lorusso Mobility Specialist Mobility Specialist 5 North: [phone] Mobility Specialist 6 North: [phone]

## 2021-07-22 NOTE — Progress Notes (Addendum)
      [address]       [place] [address]             [phone]      5 Days Post-Op Procedure(s) (LRB): MITRAL VALVE REPLACEMENT USING 27 MM MITRIS VALVE.  CRYO AND RADIOFREQUENCY MAZE PROCEDURE. LIGATION OF LEFT ATRIAL APPENDAGE. (N/A) MAZE (N/A) TRANSESOPHAGEAL ECHOCARDIOGRAM (TEE) (N/A) Subjective:  Awake and resting in bed, daughter at the bedside. Says she is less short of breath this morning. Complains of "gas" and abdominal discomfort. No BM since surgery.  Had burning at the IV site with the KCL infusion last night so it was stopped.   Objective: Vital signs in last 24 hours: Temp:  [97.8 F (36.6 C)-99.3 F (37.4 C)] 98.7 F (37.1 C) (05/24 0724) Pulse Rate:  [81-98] 88 (05/24 0724) Cardiac Rhythm: Normal sinus rhythm (05/23 1942) Resp:  [13-21] 20 (05/24 0724) BP: (102-147)/(54-91) 127/60 (05/24 0724) SpO2:  [90 %-98 %] 97 % (05/24 0724) Weight:  [83 kg] 83 kg (05/24 0325)     Intake/Output from previous day: 05/23 0701 - 05/24 0700 In: 420 [P.O.:420] Out: 2100 [Urine:2100] Intake/Output this shift: No intake/output data recorded.  General appearance: alert, cooperative, and mild distress Neurologic: intact Heart: RRR, stable SR on monitor. Lungs: breath sounds are clear anterior and laterally.  Abdomen: soft, no tenderness Extremities: warm, no significant edema Wound: the sternotomy incision is intact and dry.   Lab Results: Recent Labs    07/20/21 0656  WBC 18.7*  HGB 8.6*  HCT 27.7*  PLT 186   BMET:  Recent Labs    07/20/21 0656 07/22/21 0252  NA 137 136  K 3.8 3.2*  CL 102 94*  CO2 26 30  GLUCOSE 112* 127*  BUN 9 12  CREATININE 0.66 0.69  CALCIUM 8.1* 8.4*    PT/INR:  Recent Labs    07/22/21 0252  LABPROT 17.2*  INR 1.4*   ABG    Component Value Date/Time   PHART 7.325 (L) 07/17/2021 2050   HCO3 22.7 07/17/2021 2050   TCO2 24 07/17/2021 2050   ACIDBASEDEF 3.0 (H) 07/17/2021 2050   O2SAT 95  07/17/2021 2050   CBG (last 3)  Recent Labs    07/21/21 0622 07/21/21 1118 07/21/21 1559  GLUCAP 115* 134* 139*    Assessment/Plan: S/P Procedure(s) (LRB): MITRAL VALVE REPLACEMENT USING 27 MM MITRIS VALVE.  CRYO AND RADIOFREQUENCY MAZE PROCEDURE. LIGATION OF LEFT ATRIAL APPENDAGE. (N/A) MAZE (N/A) TRANSESOPHAGEAL ECHOCARDIOGRAM (TEE) (N/A)  -POD 5 MV replacement with a bioprosthetic valve and MAZE. VS and cardiac rhythm are stable. Coumadin resumed at her pre-admission dose of 7.5mg  daily. INR 1.4.  -PULM-  maintaining adequate O2 sats on RA but still needs a lot of work on [employer]. Encouraged ambulation IS.  -Renal- normal function, good response to Lasix. Wt near pre-op. Needs K+ replacement.  Will KCL liquid PO since she did not tolerate K-Dur tablets or IV infusion.   -GI- poor appetite, c/o gas. No BM since surgery.  Mylanta and lactulose ordered.   -Disposition- planning for eventual discharge back to home. HH PT, a walker and 3-n-1 ordered.     LOS: 5 days    Chart reviewed, patient examined, agree with above. She is making progress. Continue plan as outlined above.  Leary Roca, PA-C [phone] 07/22/2021

## 2021-07-23 LAB — BASIC METABOLIC PANEL
Anion gap: 13 (ref 5–15)
Anion gap: 10 (ref 5–15)
BUN: 11 mg/dL (ref 6–20)
BUN: 8 mg/dL (ref 6–20)
CO2: 27 mmol/L (ref 22–32)
CO2: 31 mmol/L (ref 22–32)
Calcium: 8.6 mg/dL — ABNORMAL LOW (ref 8.9–10.3)
Calcium: 8.9 mg/dL (ref 8.9–10.3)
Chloride: 96 mmol/L — ABNORMAL LOW (ref 98–111)
Chloride: 98 mmol/L (ref 98–111)
Creatinine, Ser: 0.67 mg/dL (ref 0.44–1.00)
Creatinine, Ser: 0.69 mg/dL (ref 0.44–1.00)
GFR, Estimated: >60 mL/min (ref >60)
GFR, Estimated: >60 mL/min (ref >60)
Glucose, Bld: 159 mg/dL — ABNORMAL HIGH (ref 70–99)
Glucose, Bld: 156 mg/dL — ABNORMAL HIGH (ref 70–99)
Potassium: 3.3 mmol/L — ABNORMAL LOW (ref 3.5–5.1)
Potassium: 3.4 mmol/L — ABNORMAL LOW (ref 3.5–5.1)
Sodium: 136 mmol/L (ref 135–145)
Sodium: 139 mmol/L (ref 135–145)

## 2021-07-23 LAB — PROTIME-INR
INR: 1.6 — ABNORMAL HIGH (ref 0.8–1.2)
Prothrombin Time: 19.1 seconds — ABNORMAL HIGH (ref 11.4–15.2)

## 2021-07-23 MED ORDER — POTASSIUM CHLORIDE 20 MEQ PO PACK
40.0000 meq | PACK | Freq: Two times a day (BID) | ORAL | Status: DC
  Administered 2021-07-23 – 2021-07-24 (×4): 40 meq via ORAL
  Filled 2021-07-23 (×4): qty 2

## 2021-07-23 MED ORDER — METOCLOPRAMIDE HCL 5 MG/ML IJ SOLN
10.0000 mg | Freq: Four times a day (QID) | INTRAMUSCULAR | Status: AC
  Administered 2021-07-23 (×4): 10 mg via INTRAVENOUS
  Filled 2021-07-23 (×4): qty 2

## 2021-07-23 NOTE — Progress Notes (Signed)
Occupational Therapy Treatment Patient Details Name: Abigail Butler MRN: [id number] DOB: [birth year] Today's Date: 07/23/2021   History of present illness Pt is a 56 y.o. female admitted 07/17/21 for MVR. PMH includes HTN, latent TB, removed CVA, afib/aflutter, newly dx severe MR stenosis.   OT comments  Patient received sitting on EOB and completed ambulation with mobility specialist. Patient seen to address LB dressing with patient requiring max assist to doff and donn socks. Could benefit from AE training. Patient able to donn gown to cover back with mod assist.  Patient and family educated on sternal precautions. Patient performed 5 sit to stands from EOB with no UE use and performed mobility in room and hallway without an assistive device with education on energy conservation. Patient making good progress and is motivated towards therapy.    Recommendations for follow up therapy are one component of a multi-disciplinary discharge planning process, led by the attending physician.  Recommendations may be updated based on patient status, additional functional criteria and insurance authorization.    Follow Up Recommendations  No OT follow up    Assistance Recommended at Discharge Frequent or constant Supervision/Assistance  Patient can return home with the following  A little help with walking and/or transfers;A little help with bathing/dressing/bathroom;Assistance with cooking/housework;Assist for transportation;Help with stairs or ramp for entrance   Equipment Recommendations  BSC/3in1    Recommendations for Other Services      Precautions / Restrictions Precautions Precautions: Fall;Sternal Precaution Booklet Issued: No Restrictions Weight Bearing Restrictions: Yes (sterna precautions) RUE Weight Bearing: Non weight bearing LUE Weight Bearing: Non weight bearing Other Position/Activity Restrictions: sternal precautions       Mobility Bed Mobility Overal bed mobility:  Independent             General bed mobility comments: seated on EOB upon arrival and remained on EOB at end of session    Transfers Overall transfer level: Needs assistance Equipment used: None Transfers: Sit to/from Stand Sit to Stand: Supervision           General transfer comment: adhered to precautions well with sit to stands without UE use     Balance Overall balance assessment: Needs assistance   Sitting balance-Leahy Scale: Good     Standing balance support: No upper extremity supported, During functional activity Standing balance-Leahy Scale: Fair Standing balance comment: can static stand and take steps without UE support; preference for walker                           ADL either performed or assessed with clinical judgement   ADL Overall ADL's : Needs assistance/impaired                 Upper Body Dressing : Moderate assistance Upper Body Dressing Details (indicate cue type and reason): to donn gown to cover back Lower Body Dressing: Maximal assistance Lower Body Dressing Details (indicate cue type and reason): to doff and donn socks               General ADL Comments: unable to reach feet to perform LB dressing    Extremity/Trunk Assessment              Vision       Perception     Praxis      Cognition Arousal/Alertness: Awake/alert Behavior During Therapy: WFL for tasks assessed/performed Overall Cognitive Status: Within Functional Limits for tasks assessed  General Comments: pleasant and cooperative, educated to energy conservation        Exercises Other Exercises Other Exercises: 5 sit to stands performed from EOB without UE use    Shoulder Instructions       General Comments      Pertinent Vitals/ Pain       Pain Assessment Pain Assessment: Faces Faces Pain Scale: Hurts little more Pain Location: sternal incision Pain Descriptors / Indicators:  Operative site guarding, Grimacing Pain Intervention(s): Monitored during session, Limited activity within patient's tolerance  Home Living                                          Prior Functioning/Environment              Frequency  Min 3X/week        Progress Toward Goals  OT Goals(current goals can now be found in the care plan section)  Progress towards OT goals: Progressing toward goals  Acute Rehab OT Goals Patient Stated Goal: go home OT Goal Formulation: With patient Time For Goal Achievement: 08/03/21 Potential to Achieve Goals: Good ADL Goals Pt Will Perform Lower Body Bathing: with set-up;with supervision Pt Will Perform Upper Body Dressing: with supervision;with set-up Pt Will Perform Lower Body Dressing: with set-up;with supervision;sit to/from stand Pt Will Transfer to Toilet: with modified independence;ambulating Pt Will Perform Toileting - Clothing Manipulation and hygiene: with set-up;with supervision Additional ADL Goal #1: Pt will independently demonstrate sternal precautions during ADL tasks  Plan Discharge plan remains appropriate    Co-evaluation                 AM-PAC OT "6 Clicks" Daily Activity     Outcome Measure   Help from another person eating meals?: A Little Help from another person taking care of personal grooming?: A Little Help from another person toileting, which includes using toliet, bedpan, or urinal?: A Lot Help from another person bathing (including washing, rinsing, drying)?: A Lot Help from another person to put on and taking off regular upper body clothing?: A Lot Help from another person to put on and taking off regular lower body clothing?: A Lot 6 Click Score: 14    End of Session Equipment Utilized During Treatment: Gait belt  OT Visit Diagnosis: Unsteadiness on feet (R26.81);Muscle weakness (generalized) (M62.81);Pain   Activity Tolerance Patient tolerated treatment well   Patient Left  in bed;with call bell/phone within reach;with family/visitor present   Nurse Communication Mobility status        Time: 9509-3267 OT Time Calculation (min): 24 min  Charges: OT General Charges $OT Visit: 1 Visit OT Treatments $Self Care/Home Management : 8-22 mins $Therapeutic Activity: 8-22 mins  Alfonse Flavors, OTA Acute Rehabilitation Services  Pager [phone] Office [phone]   Dewain Penning 07/23/2021, 1:34 PM

## 2021-07-23 NOTE — Progress Notes (Signed)
Mobility Specialist Progress Note:   07/23/21 1020  Mobility  Activity Refused mobility   Pt sleeping upon entry and wanting to rest. Will follow-up as time allows.   [facility] Elsbeth Yearick Mobility Specialist

## 2021-07-23 NOTE — Progress Notes (Signed)
CARDIAC REHAB PHASE I   PRE:  Rate/Rhythm: 85/SR  BP:  Sitting: 96/75      SaO2:   MODE:  Ambulation: 470 ft   POST:  Rate/Rhythm: 109/ST  BP:  Sitting: 106/76      SaO2:   Pt received in bed, agrees to ambulate. Pt assisted to bathroom to void before walk. Pt ambulated with steady gait using rolling walker 470 ft. No c/o dizziness or SOB. Pt does c/o surgical incision pain. Pt back to bed with daughter at side. Pt obtained 400 mL on the I/S. Encouraged 10 x/hr. Encouraged OOB to chair after nap. Call bell, daughter at side.   Lesly Rubenstein, MS, ACSM EP-C, [facility] 07/23/2021   [phone]:[phone]

## 2021-07-23 NOTE — Progress Notes (Signed)
Mobility Specialist Progress Note:   07/23/21 1153  Mobility  Activity Ambulated with assistance in hallway  Level of Assistance Contact guard assist, steadying assist  Assistive Device Four wheel walker  Distance Ambulated (ft) 550 ft (120 ft w/o AD)  Activity Response Tolerated well  $Mobility charge 1 Mobility   Pt received in bed willing to participate in mobility. Complaints of incision pain. Pt supervision with rollator but contact guard without AD. Left EOB with call bell in reach and all needs met.   [facility] Mobility Specialist
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
## 2021-07-23 NOTE — Progress Notes (Addendum)
      [address]       [place] [address]             [phone]      6 Days Post-Op Procedure(s) (LRB): MITRAL VALVE REPLACEMENT USING 27 MM MITRIS VALVE.  CRYO AND RADIOFREQUENCY MAZE PROCEDURE. LIGATION OF LEFT ATRIAL APPENDAGE. (N/A) MAZE (N/A) TRANSESOPHAGEAL ECHOCARDIOGRAM (TEE) (N/A)  Subjective:  Patient continues to complain her abdomen feels tight.  She also has some incisional pain.   Having issues with nausea.  Had a small BM yesterday.  Her daughter provided translation.  Objective: Vital signs in last 24 hours: Temp:  [97.6 F (36.4 C)-98.5 F (36.9 C)] 97.6 F (36.4 C) (05/25 0454) Pulse Rate:  [74-88] 88 (05/25 0454) Cardiac Rhythm: Normal sinus rhythm (05/24 1940) Resp:  [16-20] 19 (05/25 0454) BP: (101-135)/(57-82) 126/57 (05/25 0454) SpO2:  [92 %-100 %] 93 % (05/25 0454) Weight:  [82.5 kg] 82.5 kg (05/25 0454)  Intake/Output from previous day: 05/24 0701 - 05/25 0700 In: 480 [P.O.:480] Out: 601 [Urine:601]  General appearance: alert, cooperative, and no distress Heart: regular rate and rhythm Lungs: clear to auscultation bilaterally Abdomen: soft, mild distention, hypoactive BS Extremities: edema trace Wound: clean and dry  Lab Results: No results for input(s): WBC, HGB, HCT, PLT in the last 72 hours. BMET:  Recent Labs    07/22/21 0252 07/23/21 0255  NA 137  136 136  K 3.2*  3.2* 3.3*  CL 95*  94* 96*  CO2 29  30 27   GLUCOSE 127*  127* 159*  BUN 12  12 11   CREATININE 0.69  0.69 0.67  CALCIUM 8.4*  8.4* 8.6*    PT/INR:  Recent Labs    07/23/21 0255  LABPROT 19.1*  INR 1.6*   ABG    Component Value Date/Time   PHART 7.325 (L) 07/17/2021 2050   HCO3 22.7 07/17/2021 2050   TCO2 24 07/17/2021 2050   ACIDBASEDEF 3.0 (H) 07/17/2021 2050   O2SAT 95 07/17/2021 2050   CBG (last 3)  Recent Labs    07/21/21 0622 07/21/21 1118 07/21/21 1559  GLUCAP 115* 134* 139*    Assessment/Plan: S/P Procedure(s)  (LRB): MITRAL VALVE REPLACEMENT USING 27 MM MITRIS VALVE.  CRYO AND RADIOFREQUENCY MAZE PROCEDURE. LIGATION OF LEFT ATRIAL APPENDAGE. (N/A) MAZE (N/A) TRANSESOPHAGEAL ECHOCARDIOGRAM (TEE) (N/A)  CV- NSR with 1st degree heart block which is stable, continue Lopressor 25 mg daily INR 1.6, continue coumadin at 7.5 mg daily Hypokalemia- persists, patient having difficulty with nausea and has been vomiting medications at times, will continue K dur packet, will give 40 meq BID today GI- constipation mild, nausea... continue zofran prn, will add scheduled reglan to see if we can help facilitate better GI motility, prn laxative will be ordered Deconditioning- mild, patient is ambulating with walker, home orders have been placed, arranged Dispo- patient stable, GI issues persists will work on these again today, supplementation potassium.. patient will be ready for d/c once GI issues resolve   LOS: 6 days    07/23/21, PA-C 07/23/2021   Chart reviewed, patient examined, agree with above. Wt is back at baseline. Will stop lasix. Repete K+.

## 2021-07-24 ENCOUNTER — Other Ambulatory Visit (HOSPITAL_COMMUNITY): Payer: Self-pay

## 2021-07-24 LAB — GLUCOSE, CAPILLARY
Glucose-Capillary: 124 mg/dL — ABNORMAL HIGH (ref 70–99)
Glucose-Capillary: 91 mg/dL (ref 70–99)
Glucose-Capillary: 140 mg/dL — ABNORMAL HIGH (ref 70–99)

## 2021-07-24 LAB — PROTIME-INR
INR: 1.8 — ABNORMAL HIGH (ref 0.8–1.2)
Prothrombin Time: 21 seconds — ABNORMAL HIGH (ref 11.4–15.2)

## 2021-07-24 MED ORDER — METOPROLOL TARTRATE 25 MG PO TABS
25.0000 mg | ORAL_TABLET | Freq: Two times a day (BID) | ORAL | 3 refills | Status: DC
Start: 2021-07-24 — End: 2021-11-18
  Filled 2021-07-24: qty 60, 30d supply, fill #0
  Filled 2021-08-24: qty 60, 30d supply, fill #1
  Filled 2021-09-18: qty 60, 30d supply, fill #2
  Filled 2021-10-22: qty 60, 30d supply, fill #0

## 2021-07-24 MED ORDER — TRAMADOL HCL 50 MG PO TABS
50.0000 mg | ORAL_TABLET | ORAL | 0 refills | Status: DC | PRN
  Filled 2021-07-24: qty 30, 5d supply, fill #0

## 2021-07-24 NOTE — TOC Progression Note (Signed)
Transition of Care (TOC) - Progression Note  Donn Pierini RN, BSN Transitions of Care Unit 4E- RN Case Manager See Treatment Team for direct phone #    Patient Details  Name: Abigail Butler MRN: [id number] Date of Birth: May 27, 1965  Transition of Care [facility]) CM/SW Contact  Zenda Alpers Lenn Sink, RN Phone Number: 07/24/2021, 11:03 AM  Clinical Narrative:    Notified by Rolene Arbour that [facility] follow post discharge for [facility]PT.  [employer] has delivered DME to the room- Rollator and 3n1 for home.   TOC pharmacy delivered meds for discharge- bedside RN placed medication from [facility] in the daily bin for patient at time of D/C   Expected Discharge Plan: Home w Home Health Services Barriers to Discharge: Continued Medical Work up  Expected Discharge Plan and Services Expected Discharge Plan: Home w Home Health Services   Discharge Planning Services: CM Consult Post Acute Care Choice: Durable Medical Equipment, Home Health Living arrangements for the past 2 months: Apartment                 DME Arranged: 3-N-1, Walker rolling DME Agency: [employer] Date DME Agency Contacted: 07/23/21 Time DME Agency Contacted: [phone] Representative spoke with at DME Agency: Arnold Long HH Arranged: PT HH Agency: [facility] Date [facility] Agency Contacted: 07/23/21 Time HH Agency Contacted: [phone] Representative spoke with at [facility] Agency: Victorino Dike   Social Determinants of Health (SDOH) Interventions    Readmission Risk Interventions     View : No data to display.

## 2021-07-24 NOTE — Progress Notes (Signed)
CARDIAC REHAB PHASE I   PRE:  Rate/Rhythm: 83 SR  MODE:  Ambulation: 470 ft   POST:  Rate/Rhythm: 88 SR   Pt ambulated 462ft in hallway standby assist with rollator. Pt denies CP, SOB, or dizziness. Pt and daughter educated on site care, restrictions, IS use, and exercise guidelines. Will refer to [facility] [place].  [id number] Reynold Bowen, RN BSN 07/24/2021 2:58 PM

## 2021-07-24 NOTE — Progress Notes (Signed)
      [address]       ,[place] [address]             [phone]       7 Days Post-Op Procedure(s) (LRB): MITRAL VALVE REPLACEMENT USING 27 MM MITRIS VALVE.  CRYO AND RADIOFREQUENCY MAZE PROCEDURE. LIGATION OF LEFT ATRIAL APPENDAGE. (N/A) MAZE (N/A) TRANSESOPHAGEAL ECHOCARDIOGRAM (TEE) (N/A)  Subjective:  Patient with no new complaints.  She continues to have abdominal tightness.  Her appetite remains marginal.  She is ambulating and has moved her bowels.  Objective: Vital signs in last 24 hours: Temp:  [98.3 F (36.8 C)-100 F (37.8 C)] 98.3 F (36.8 C) (05/26 0415) Pulse Rate:  [79-94] 80 (05/26 0415) Cardiac Rhythm: Normal sinus rhythm (05/25 2002) Resp:  [16-27] 20 (05/26 0415) BP: (113-139)/(50-81) 137/63 (05/26 0415) SpO2:  [99 %-100 %] 100 % (05/26 0415) Weight:  [81.9 kg] 81.9 kg (05/26 0415)  Intake/Output from previous day: 05/25 0701 - 05/26 0700 In: 3 [I.V.:3] Out: 0   General appearance: alert, cooperative, and no distress Heart: regular rate and rhythm Lungs: clear to auscultation bilaterally Abdomen: soft, non-tender; bowel sounds normal; no masses,  no organomegaly Extremities: extremities normal, atraumatic, no cyanosis or edema Wound: clean and dry  Lab Results: No results for input(s): WBC, HGB, HCT, PLT in the last 72 hours. BMET:  Recent Labs    07/23/21 0255 07/23/21 0950  NA 136 139  K 3.3* 3.4*  CL 96* 98  CO2 27 31  GLUCOSE 159* 156*  BUN 11 8  CREATININE 0.67 0.69  CALCIUM 8.6* 8.9    PT/INR:  Recent Labs    07/24/21 0122  LABPROT 21.0*  INR 1.8*   ABG    Component Value Date/Time   PHART 7.325 (L) 07/17/2021 2050   HCO3 22.7 07/17/2021 2050   TCO2 24 07/17/2021 2050   ACIDBASEDEF 3.0 (H) 07/17/2021 2050   O2SAT 95 07/17/2021 2050   CBG (last 3)  Recent Labs    07/21/21 1118 07/21/21 1559  GLUCAP 134* 139*    Assessment/Plan: S/P Procedure(s) (LRB): MITRAL VALVE REPLACEMENT USING 27 MM  MITRIS VALVE.  CRYO AND RADIOFREQUENCY MAZE PROCEDURE. LIGATION OF LEFT ATRIAL APPENDAGE. (N/A) MAZE (N/A) TRANSESOPHAGEAL ECHOCARDIOGRAM (TEE) (N/A)  CV- NSR with 1st degree AV Block, stable- continue Lopressor INR at 1.8, continue current regimen of coumadin Hypokalemia- improving, but remains low, will continue supplementation GI- unsure what to make of GI symptoms, she has moved her bowels and exam is benign, will continue supportive care Deconditioning- mild, home health has been arranged Dispo- patient is stable, remains in NSR, hypokalemia persists but is improving, will supplement again today, if K normalizes will plan to d/c home tomorrow   LOS: 7 days   Ellwood Handler, PA-C 07/24/2021

## 2021-07-24 NOTE — Progress Notes (Signed)
Physical Therapy Treatment & Discharge Patient Details Name: Abigail Butler MRN: [id number] DOB: [birth year] Today's Date: 07/24/2021   History of Present Illness Pt is a 56 y.o. female admitted 07/17/21 for MVR. PMH includes HTN, latent TB, removed CVA, afib/aflutter, newly dx severe MR stenosis.   PT Comments    Pt progressing with mobility. Today's session focused on ambulation for improving strength and activity tolerance; pt's rollator/BSC adjusted to her height. Still only pulling ~250-523m on incentive spirometer; min cues for sequencing, encouraged to increase frequency. Pt mod indep with transfers and ambulation; has met short-term acute PT goals. Encouraged continued hallway ambulation with her daughter. Pt and daughter report no further questions or concerns. Will d/c acute PT.  SpO2 94% on RA, HR 98    Recommendations for follow up therapy are one component of a multi-disciplinary discharge planning process, led by the attending physician.  Recommendations may be updated based on patient status, additional functional criteria and insurance authorization.  Follow Up Recommendations  Home health PT     Assistance Recommended at Discharge PRN  Patient can return home with the following A little help with bathing/dressing/bathroom;Assistance with cooking/housework;Assist for transportation;Help with stairs or ramp for entrance   Equipment Recommendations   (delivered)    Recommendations for Other Services       Precautions / Restrictions Precautions Precautions: Fall;Sternal     Mobility  Bed Mobility               General bed mobility comments: received sitting in recliner    Transfers Overall transfer level: Independent Equipment used: None Transfers: Sit to/from [place] comment: intermittent cues for daughter to maintain sternal precautions, indep to stand with hands on knees; multiple sit<>stands from recliner, BSC (to test  height of pt's new DME) and rollator seat    Ambulation/Gait Ambulation/Gait assistance: Modified independent (Device/Increase time) Gait Distance (Feet): 40 Feet (470) Assistive device: None, Rollator (4 wheels) Gait Pattern/deviations: Step-through pattern, Decreased stride length, Trunk flexed Gait velocity: Decreased     General Gait Details: ambulating throughout room without DME, supervision for safety; mod indep ambulating with rollator, improved upright posture and sequencing with rollator   Stairs             Wheelchair Mobility    Modified Rankin (Stroke Patients Only)       Balance Overall balance assessment: Modified Independent   Sitting balance-Leahy Scale: Good     Standing balance support: No upper extremity supported, During functional activity Standing balance-Leahy Scale: Fair                              Cognition Arousal/Alertness: Awake/alert Behavior During Therapy: WFL for tasks assessed/performed Overall Cognitive Status: Within Functional Limits for tasks assessed                                 General Comments: pleasant and cooperative        Exercises Other Exercises Other Exercises: incentive spirometer x8 - pt pulling 250-5042m min cues for technique    General Comments General comments (skin integrity, edema, etc.): pt's daughter present and supportive. SpO2 94% on RA, HR 98. [facility] adjusted for pt's height during session      Pertinent Vitals/Pain Pain Assessment Pain Assessment: Faces  Faces Pain Scale: Hurts a little bit Pain Location: sternal incision Pain Descriptors / Indicators: Tightness Pain Intervention(s): Monitored during session, Limited activity within patient's tolerance    Home Living                          Prior Function            PT Goals (current goals can now be found in the care plan section) Progress towards PT  goals: Goals met/education completed, patient discharged from PT    Frequency    Min 3X/week      PT Plan Current plan remains appropriate    Co-evaluation              AM-PAC PT "6 Clicks" Mobility   Outcome Measure  Help needed turning from your back to your side while in a flat bed without using bedrails?: None Help needed moving from lying on your back to sitting on the side of a flat bed without using bedrails?: None Help needed moving to and from a bed to a chair (including a wheelchair)?: None Help needed standing up from a chair using your arms (e.g., wheelchair or bedside chair)?: None Help needed to walk in hospital room?: None Help needed climbing 3-5 steps with a railing? : A Little 6 Click Score: 23    End of Session   Activity Tolerance: Patient tolerated treatment well Patient left: in chair;with call bell/phone within reach;with family/visitor present Nurse Communication: Mobility status PT Visit Diagnosis: Unsteadiness on feet (R26.81);Difficulty in walking, not elsewhere classified (R26.2)     Time: 0811-0828 PT Time Calculation (min) (ACUTE ONLY): 17 min  Charges:  $Therapeutic Exercise: 8-22 mins                     [name], PT, DPT Acute Rehabilitation Services  Pager [phone] Office [place] 07/24/2021, 8:38 AM

## 2021-07-24 NOTE — Progress Notes (Deleted)
Patient medication from [facility] are in the Daily Bin in pyxis for D/C home

## 2021-07-25 LAB — BASIC METABOLIC PANEL
Anion gap: 7 (ref 5–15)
BUN: 7 mg/dL (ref 6–20)
CO2: 24 mmol/L (ref 22–32)
Calcium: 8.9 mg/dL (ref 8.9–10.3)
Chloride: 104 mmol/L (ref 98–111)
Creatinine, Ser: 0.74 mg/dL (ref 0.44–1.00)
GFR, Estimated: >60 mL/min (ref >60)
Glucose, Bld: 139 mg/dL — ABNORMAL HIGH (ref 70–99)
Potassium: 5.2 mmol/L — ABNORMAL HIGH (ref 3.5–5.1)
Sodium: 135 mmol/L (ref 135–145)

## 2021-07-25 LAB — PROTIME-INR
INR: 2 — ABNORMAL HIGH (ref 0.8–1.2)
Prothrombin Time: 22.6 seconds — ABNORMAL HIGH (ref 11.4–15.2)

## 2021-07-25 MED ORDER — METOCLOPRAMIDE HCL 5 MG PO TABS: 5.0000 mg | ORAL_TABLET | Freq: Three times a day (TID) | ORAL | 0 refills | Status: DC | PRN

## 2021-07-25 NOTE — Progress Notes (Signed)
      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  CRYO AND RADIOFREQUENCY MAZE PROCEDURE. LIGATION OF LEFT ATRIAL APPENDAGE. (N/A) MAZE (N/A) TRANSESOPHAGEAL ECHOCARDIOGRAM (TEE) (N/A)  Subjective:  Patient without new complaints.  She states her belly is doing better.  She continues to have some nausea at times.  She is also having some dizziness which her daughter states is chronic for her.  + ambulation   + BM  Objective: Vital signs in last 24 hours: Temp:  [98 F (36.7 C)-99.6 F (37.6 C)] 98 F (36.7 C) (05/27 0739) Pulse Rate:  [72-87] 79 (05/27 0855) Cardiac Rhythm: Normal sinus rhythm (05/26 2200) Resp:  [15-24] 17 (05/27 0739) BP: (112-137)/(46-90) 137/64 (05/27 0855) SpO2:  [100 %] 100 % (05/27 0739) Weight:  [81.5 kg] 81.5 kg (05/27 0620)  Intake/Output from previous day: 05/26 0701 - 05/27 0700 In: 150 [P.O.:150] Out: -   General appearance: alert, cooperative, and no distress Heart: regular rate and rhythm Lungs: clear to auscultation bilaterally Abdomen: soft, non-tender; bowel sounds normal; no masses,  no organomegaly Extremities: edema none present Wound: clean and dry  Lab Results: No results for input(s): WBC, HGB, HCT, PLT in the last 72 hours. BMET:  Recent Labs    07/23/21 0950 07/25/21 0219  NA 139 135  K 3.4* 5.2*  CL 98 104  CO2 31 24  GLUCOSE 156* 139*  BUN 8 7  CREATININE 0.69 0.74  CALCIUM 8.9 8.9    PT/INR:  Recent Labs    07/25/21 0219  LABPROT 22.6*  INR 2.0*   ABG    Component Value Date/Time   PHART 7.325 (L) 07/17/2021 2050   HCO3 22.7 07/17/2021 2050   TCO2 24 07/17/2021 2050   ACIDBASEDEF 3.0 (H) 07/17/2021 2050   O2SAT 95 07/17/2021 2050   CBG (last 3)  Recent Labs    07/24/21 1120 07/24/21 1640 07/24/21 2113  GLUCAP 124* 91 140*    Assessment/Plan: S/P Procedure(s)  (LRB): MITRAL VALVE REPLACEMENT USING 27 MM MITRIS VALVE.  CRYO AND RADIOFREQUENCY MAZE PROCEDURE. LIGATION OF LEFT ATRIAL APPENDAGE. (N/A) MAZE (N/A) TRANSESOPHAGEAL ECHOCARDIOGRAM (TEE) (N/A)  CV- NSR with 1st degree block remains stable, continue Lopressor INR 2.0, continue coumadin at home regimen Pulm- off oxygen, continue IS Renal- weight is below baseline, no lasix at this time Hypokalemia- resolved GI- nausea at times, will send anti-emetic to pharmacy Dizziness- chronic  Dispo- patient stable, will d/c home today.   LOS: 8 days    Lowella Dandy, PA-C 07/25/2021

## 2021-07-25 NOTE — TOC CM/SW Note (Signed)
Made Delsa Sale with [facility] home health aware patient to dc home today. Confirmed with patient's nurse that DME is in room. Nursing to obtain [facility] meds from main pharmacy. Arrangements were made by weekday RNCM. Please re-consult if TOC needed.    Marthenia Rolling, MSN, RN,BSN Inpatient [facility] Case Manager [phone]

## 2021-07-25 NOTE — Progress Notes (Signed)
Mobility Specialist: Progress Note   07/25/21 1143  Mobility  Activity Ambulated with assistance in hallway  Level of Assistance Modified independent, requires aide device or extra time  Assistive Device Four wheel walker  Distance Ambulated (ft) 240 ft  Activity Response Tolerated well  $Mobility charge 1 Mobility   Received pt in chair having no complaints and agreeable to mobility. Asymptomatic throughout ambulation, returned back to chair w/ all needs met.  [facility] Nichalos Brenton Mobility Specialist Mobility Specialist 5 North: [phone] Mobility Specialist 6 North: [phone]

## 2021-07-25 NOTE — Progress Notes (Signed)
Patient discharging home with assist from two daughters. IV removed without complications. Tele removed and CCMD notified. Sutures removed per order and no complications. Discharge instructions completed and medication administration discussed.  Rolator and 3-in-1 delivered to room. Meds picked up from main pharmacy. All questions answered.  Abigail Butler

## 2021-07-25 NOTE — Plan of Care (Signed)
  Problem: Clinical Measurements: Goal: Will remain free from infection Outcome: Progressing Goal: Respiratory complications will improve Outcome: Progressing Goal: Cardiovascular complication will be avoided Outcome: Progressing   

## 2021-07-31 ENCOUNTER — Telehealth (HOSPITAL_COMMUNITY): Payer: Self-pay

## 2021-07-31 NOTE — Telephone Encounter (Signed)
Outside referral faxed to [place]. 

## 2021-08-03 ENCOUNTER — Ambulatory Visit (INDEPENDENT_AMBULATORY_CARE_PROVIDER_SITE_OTHER): Payer: Self-pay | Admitting: Pharmacist

## 2021-08-03 DIAGNOSIS — I4891 Unspecified atrial fibrillation: Secondary | ICD-10-CM

## 2021-08-03 DIAGNOSIS — I513 Intracardiac thrombosis, not elsewhere classified: Secondary | ICD-10-CM

## 2021-08-03 DIAGNOSIS — Z8673 Personal history of transient ischemic attack (TIA), and cerebral infarction without residual deficits: Secondary | ICD-10-CM

## 2021-08-03 DIAGNOSIS — I052 Rheumatic mitral stenosis with insufficiency: Secondary | ICD-10-CM

## 2021-08-03 DIAGNOSIS — I4892 Unspecified atrial flutter: Secondary | ICD-10-CM

## 2021-08-03 DIAGNOSIS — Z7901 Long term (current) use of anticoagulants: Secondary | ICD-10-CM

## 2021-08-03 LAB — POCT INR: INR: 6.5 — AB (ref 2.0–3.0)
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
## 2021-08-03 NOTE — Patient Instructions (Signed)
Patient instructed to take medications as defined in the Anti-coagulation Track section of this encounter.  Patient instructed to OMIT today's dose.  Patient instructed to take one of your 7.5mg  strength yellow warfarin tablets, starting on Tuesday, June 6th, 2023. Take one tablet on Wednesday; take 1/2 tablet on Thursday; Return to clinic on Friday, August 07, 2021 at 9:30 am for follow up INR.  Patient verbalized understanding of these instructions.

## 2021-08-04 ENCOUNTER — Telehealth: Payer: Self-pay | Admitting: *Deleted

## 2021-08-04 LAB — CMP14 + ANION GAP
ALT: 27 IU/L (ref 0–32)
AST: 26 IU/L (ref 0–40)
Albumin/Globulin Ratio: 1 — ABNORMAL LOW (ref 1.2–2.2)
Albumin: 3.7 g/dL — ABNORMAL LOW (ref 3.8–4.9)
Alkaline Phosphatase: 244 IU/L — ABNORMAL HIGH (ref 44–121)
Anion Gap: 16 mmol/L (ref 10.0–18.0)
BUN/Creatinine Ratio: 6 — ABNORMAL LOW (ref 9–23)
BUN: 4 mg/dL — ABNORMAL LOW (ref 6–24)
Bilirubin Total: 0.6 mg/dL (ref 0.0–1.2)
CO2: 21 mmol/L (ref 20–29)
Calcium: 9 mg/dL (ref 8.7–10.2)
Chloride: 102 mmol/L (ref 96–106)
Creatinine, Ser: 0.64 mg/dL (ref 0.57–1.00)
Globulin, Total: 3.6 g/dL (ref 1.5–4.5)
Glucose: 145 mg/dL — ABNORMAL HIGH (ref 70–99)
Potassium: 4.3 mmol/L (ref 3.5–5.2)
Sodium: 139 mmol/L (ref 134–144)
Total Protein: 7.3 g/dL (ref 6.0–8.5)
eGFR: 103 mL/min/{1.73_m2} (ref >59)

## 2021-08-04 NOTE — Telephone Encounter (Signed)
Call from Lea,PT with [facility] - stated pt heart open heart surgery and no insurance and only allowed 3 visits. Requesting 2 PT visits and 1 Nursing visit. Stated she saw pt yesterday for Start of Care Visit - pt is very weak, no appetite, unsure about her medications. VO given "2 PT visits for strengthening and 1 nurse visit for medication education and assessment". If not appropriate, let me know. Thanks

## 2021-08-05 NOTE — Telephone Encounter (Signed)
Please schedule HFU appt per Dr Huel Cote. Thanks

## 2021-08-06 ENCOUNTER — Telehealth: Payer: Self-pay

## 2021-08-06 ENCOUNTER — Other Ambulatory Visit (HOSPITAL_COMMUNITY): Payer: Self-pay

## 2021-08-06 NOTE — Telephone Encounter (Signed)
Return call to [facility] RN-CM with [facility]. Stated she's doing a "charity care visit" since pt does not insurance. She asked if digoxin had been discontinued - informed per discharge note 5/22 per vascular ([facility]) digoxin was discontinued " STOP taking these medications         digoxin 0.25 MG tablet Commonly known as: LANOXIN   She stated pt had applied for Medicaid - wanting to know if our office to see if had went thru. Misty Stanley asked if we have a SW; I told her we do. Pt has an appt tomorrow with Dr Alroy Bailiff and SW referral can be ordered if appropriate.  I asked [place], front office, if she could check. Pt has Medicaid Family Planning. I called Misty Stanley back and left a message.  Thanks

## 2021-08-06 NOTE — Telephone Encounter (Signed)
LIsa with [facility] requesting to speak with a nurse about Digoxin. Want to know if pt stop taking this medication. Please call back.

## 2021-08-07 ENCOUNTER — Ambulatory Visit (INDEPENDENT_AMBULATORY_CARE_PROVIDER_SITE_OTHER): Payer: Self-pay | Admitting: Pharmacist

## 2021-08-07 ENCOUNTER — Ambulatory Visit (INDEPENDENT_AMBULATORY_CARE_PROVIDER_SITE_OTHER): Payer: Self-pay | Admitting: Internal Medicine

## 2021-08-07 ENCOUNTER — Other Ambulatory Visit (HOSPITAL_COMMUNITY): Payer: Self-pay

## 2021-08-07 ENCOUNTER — Encounter: Payer: Self-pay | Admitting: Internal Medicine

## 2021-08-07 ENCOUNTER — Other Ambulatory Visit: Payer: Self-pay | Admitting: Internal Medicine

## 2021-08-07 VITALS — BP 158/37 | HR 95 | Temp 99.1°F | Ht 64.0 in | Wt 175.0 lb

## 2021-08-07 DIAGNOSIS — R5383 Other fatigue: Secondary | ICD-10-CM

## 2021-08-07 DIAGNOSIS — R110 Nausea: Secondary | ICD-10-CM

## 2021-08-07 DIAGNOSIS — E878 Other disorders of electrolyte and fluid balance, not elsewhere classified: Secondary | ICD-10-CM

## 2021-08-07 DIAGNOSIS — Z8673 Personal history of transient ischemic attack (TIA), and cerebral infarction without residual deficits: Secondary | ICD-10-CM

## 2021-08-07 DIAGNOSIS — Z952 Presence of prosthetic heart valve: Secondary | ICD-10-CM

## 2021-08-07 DIAGNOSIS — I513 Intracardiac thrombosis, not elsewhere classified: Secondary | ICD-10-CM

## 2021-08-07 DIAGNOSIS — Z7901 Long term (current) use of anticoagulants: Secondary | ICD-10-CM

## 2021-08-07 DIAGNOSIS — I4891 Unspecified atrial fibrillation: Secondary | ICD-10-CM

## 2021-08-07 DIAGNOSIS — I4892 Unspecified atrial flutter: Secondary | ICD-10-CM

## 2021-08-07 LAB — POCT INR: INR: 2.1 (ref 2.0–3.0)

## 2021-08-07 MED ORDER — ONDANSETRON HCL 4 MG PO TABS: 4.0000 mg | ORAL_TABLET | Freq: Every day | ORAL | 0 refills | Status: DC | PRN

## 2021-08-07 MED ORDER — ONDANSETRON HCL 4 MG PO TABS: 4.0000 mg | ORAL_TABLET | Freq: Every day | ORAL | 1 refills | Status: DC | PRN

## 2021-08-07 MED ORDER — TRAMADOL HCL 50 MG PO TABS: 50.0000 mg | ORAL_TABLET | ORAL | 0 refills | Status: AC | PRN

## 2021-08-07 MED ORDER — TRAMADOL HCL 50 MG PO TABS: 50.0000 mg | ORAL_TABLET | ORAL | 0 refills | Status: DC | PRN

## 2021-08-07 MED ORDER — ONDANSETRON HCL 4 MG PO TABS: 4.0000 mg | ORAL_TABLET | Freq: Every day | ORAL | 0 refills | Status: AC | PRN

## 2021-08-07 NOTE — Assessment & Plan Note (Addendum)
Pt is here today for a hospital follow-up s/p MVR during her most recent hospitalization.  The patient's family states that they thought she was supposed to continue diltiazem after being in the hospital, however Dr. Alexandria Lodge clarified with policy that she is supposed to stop this medication.  She has otherwise been compliant with her warfarin at home. INR is at goal today. The patient's family states that she has not been doing too well after surgery.  She has been nauseous and vomiting often has had decreased appetite.  She has felt weak from not being able to eat during the day.  She did, however, start taking ensures a couple days ago.  She has also had significant pain at the post op site.  Took tramadol for the pain help, did not take Tylenol.  Did not take any antiemetics postoperatively. Has had home PT come to the house twice.  Plan: Will prescribe zofran and tramadol for 5 more days. N/V likely 2/2 pain medication. Will try to get both n/v and pain under control. Patient has an appointment with the surgeon in 5 days.  If her nausea, vomiting and do not improve by then, she was instructed to bring this up with her surgeon at that appointment.  Patient and family are in agreement this plan.  We will also obtain CBC and BMP today.

## 2021-08-07 NOTE — Patient Instructions (Signed)
Thank you, [name] for allowing [place] to provide your care today. Today we discussed your hospital visit.  Please continue to take Ensures.   I have prescribed zofran for nausea and tramadol for pain.  I will contact you with the lab results if they are abnormal.  I have ordered the following labs for you:  Lab Orders         BMP8+Anion Gap         CBC no Diff       Referrals ordered today:   Referral Orders  No referral(s) requested today     I have ordered the following medication/changed the following medications:    Start the following medications: Meds ordered this encounter  Medications   DISCONTD: ondansetron (ZOFRAN) 4 MG tablet    Sig: Take 1 tablet (4 mg total) by mouth daily as needed for nausea or vomiting.    Dispense:  30 tablet    Refill:  1   traMADol (ULTRAM) 50 MG tablet    Sig: Take 1 tablet (50 mg total) by mouth every 4 (four) hours as needed for up to 5 days for moderate pain.    Dispense:  30 tablet    Refill:  0   ondansetron (ZOFRAN) 4 MG tablet    Sig: Take 1 tablet (4 mg total) by mouth daily as needed for nausea or vomiting.    Dispense:  15 tablet    Refill:  0     If her nausea and pain do not improve by your next follow-up with the surgeons, please let them know.   Should you have any questions or concerns please call the internal medicine clinic at [phone].
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
## 2021-08-07 NOTE — Patient Instructions (Signed)
Patient instructed to take medications as defined in the Anti-coagulation Track section of this encounter.  Patient instructed to take today's dose.  Patient instructed to take one (1) of your 7.5mg  yellow warfarin tablets by mouth, once-daily. Patient verbalized understanding of these instructions.

## 2021-08-07 NOTE — Progress Notes (Signed)
INTERNAL MEDICINE TEACHING ATTENDING ADDENDUM - Lusia Greis M.D  Duration- indefinite, Indication- afib, s/p MVR, left atrial appendage thrombus, INR- therapeutic. Agree with pharmacy recommendations as outlined in their note.

## 2021-08-08 LAB — CBC
Hematocrit: 32.8 % — ABNORMAL LOW (ref 34.0–46.6)
Hemoglobin: 10 g/dL — ABNORMAL LOW (ref 11.1–15.9)
MCH: 24.8 pg — ABNORMAL LOW (ref 26.6–33.0)
MCHC: 30.5 g/dL — ABNORMAL LOW (ref 31.5–35.7)
MCV: 81 fL (ref 79–97)
Platelets: 403 10*3/uL (ref 150–450)
RBC: 4.04 x10E6/uL (ref 3.77–5.28)
RDW: 13.8 % (ref 11.7–15.4)
WBC: 11.1 10*3/uL — ABNORMAL HIGH (ref 3.4–10.8)

## 2021-08-08 LAB — BMP8+ANION GAP
Anion Gap: 16 mmol/L (ref 10.0–18.0)
BUN/Creatinine Ratio: 19 (ref 9–23)
BUN: 11 mg/dL (ref 6–24)
CO2: 22 mmol/L (ref 20–29)
Calcium: 9.4 mg/dL (ref 8.7–10.2)
Chloride: 102 mmol/L (ref 96–106)
Creatinine, Ser: 0.58 mg/dL (ref 0.57–1.00)
Glucose: 116 mg/dL — ABNORMAL HIGH (ref 70–99)
Potassium: 4.7 mmol/L (ref 3.5–5.2)
Sodium: 140 mmol/L (ref 134–144)
eGFR: 105 mL/min/{1.73_m2} (ref >59)

## 2021-08-10 ENCOUNTER — Other Ambulatory Visit: Payer: Self-pay | Admitting: Surgery

## 2021-08-10 DIAGNOSIS — Z952 Presence of prosthetic heart valve: Secondary | ICD-10-CM

## 2021-08-10 NOTE — Progress Notes (Signed)
Internal Medicine Clinic Attending ° °Case discussed with Dr. Bonanno  °  At the time of the visit.  We reviewed the resident’s history and exam and pertinent patient test results.  I agree with the assessment, diagnosis, and plan of care documented in the resident’s note. ° °

## 2021-08-12 ENCOUNTER — Ambulatory Visit
Admission: RE | Admit: 2021-08-12 | Discharge: 2021-08-12 | Disposition: A | Discharge status: Home / Self Care | Payer: Medicaid Other | Source: Ambulatory Visit | Attending: Surgery | Admitting: Surgery

## 2021-08-12 ENCOUNTER — Encounter: Payer: Self-pay | Admitting: Surgery

## 2021-08-12 ENCOUNTER — Telehealth: Payer: Self-pay | Admitting: Pharmacist

## 2021-08-12 ENCOUNTER — Ambulatory Visit (INDEPENDENT_AMBULATORY_CARE_PROVIDER_SITE_OTHER): Payer: Self-pay | Admitting: Surgery

## 2021-08-12 VITALS — BP 118/74 | HR 94 | Resp 20 | Ht 64.0 in | Wt 175.0 lb

## 2021-08-12 DIAGNOSIS — Z952 Presence of prosthetic heart valve: Secondary | ICD-10-CM

## 2021-08-12 DIAGNOSIS — I052 Rheumatic mitral stenosis with insufficiency: Secondary | ICD-10-CM

## 2021-08-12 NOTE — Telephone Encounter (Signed)
Was asked to have provider sign last visit. Signature shows.

## 2021-08-17 ENCOUNTER — Ambulatory Visit: Payer: Medicaid Other

## 2021-08-17 NOTE — Progress Notes (Signed)
Evaluation and management procedures were performed by the Clinical Pharmacy Practitioner under my supervision and collaboration. I have reviewed the Practitioner's note and chart, and I agree with the management and plan as documented above. ° °

## 2021-08-18 ENCOUNTER — Ambulatory Visit (INDEPENDENT_AMBULATORY_CARE_PROVIDER_SITE_OTHER): Payer: Self-pay | Admitting: Pharmacist

## 2021-08-18 DIAGNOSIS — I4892 Unspecified atrial flutter: Secondary | ICD-10-CM

## 2021-08-18 DIAGNOSIS — I4891 Unspecified atrial fibrillation: Secondary | ICD-10-CM

## 2021-08-18 DIAGNOSIS — Z952 Presence of prosthetic heart valve: Secondary | ICD-10-CM

## 2021-08-18 DIAGNOSIS — Z7901 Long term (current) use of anticoagulants: Secondary | ICD-10-CM

## 2021-08-18 DIAGNOSIS — I513 Intracardiac thrombosis, not elsewhere classified: Secondary | ICD-10-CM

## 2021-08-18 DIAGNOSIS — Z8673 Personal history of transient ischemic attack (TIA), and cerebral infarction without residual deficits: Secondary | ICD-10-CM

## 2021-08-18 LAB — POCT INR: INR: 2.5 (ref 2.0–3.0)
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
## 2021-08-18 NOTE — Patient Instructions (Signed)
Patient instructed to take medications as defined in the Anti-coagulation Track section of this encounter.  Patient instructed to take today's dose.  Patient instructed to take one (1) of your 7.5mg  yellow-warfarin tablets by mouth, once-daily--EXCEPT on TUESDAYS and FRIDAYS, take one-and-one-half (1 & 1/2) of your 7.5mg  yellow-warfarin tablets on TUESDAYS and FRIDAYS. Patient verbalized understanding of these instructions.

## 2021-08-24 ENCOUNTER — Other Ambulatory Visit (HOSPITAL_COMMUNITY): Payer: Self-pay

## 2021-08-28 ENCOUNTER — Ambulatory Visit (HOSPITAL_COMMUNITY): Payer: Medicaid Other | Attending: Cardiology

## 2021-08-28 DIAGNOSIS — Z952 Presence of prosthetic heart valve: Secondary | ICD-10-CM | POA: Insufficient documentation

## 2021-08-28 LAB — ECHOCARDIOGRAM COMPLETE
Area-P 1/2: 4.05 cm2
MV VTI: 3.16 cm2
P 1/2 time: 82 msec
S' Lateral: 4.3 cm

## 2021-08-28 NOTE — Progress Notes (Signed)
HPI:  Patient returns for routine postoperative follow-up having undergone mitral valve replacement using a 27 mm MITRIS [place]ilia pericardial valve with biatrial maze procedure and clipping of left atrial appendage on 07/17/2021. The patient's early postoperative recovery while in the hospital was notable for a slow recovery due to nausea and deconditioning.  She was started on Coumadin.  She continued to improve and was discharged home in the care of her daughter with home health.  She is here today with her daughter and interpreter. Since hospital discharge the patient reports that she has continued to improve.  She is walking without chest pain or shortness of breath.  Her appetite is not back to normal.   Current Outpatient Medications  Medication Sig Dispense Refill   acetaminophen (TYLENOL) 500 MG tablet Take 500-1,000 mg by mouth every 6 (six) hours as needed (for pain.). (Patient not taking: Reported on 08/18/2021)     aspirin 81 MG EC tablet Take 1 tablet (81 mg total) by mouth daily. Swallow whole. 150 tablet 2   atorvastatin (LIPITOR) 40 MG tablet Take 1 tablet (40 mg total) by mouth daily. (Patient taking differently: Take 40 mg by mouth daily at 4 PM.) 90 tablet 3   metoCLOPramide (REGLAN) 5 MG tablet Take 1 tablet (5 mg total) by mouth every 8 (eight) hours as needed for nausea or vomiting. (Patient not taking: Reported on 08/18/2021) 90 tablet 0   metoprolol tartrate (LOPRESSOR) 25 MG tablet Take 1 tablet (25 mg total) by mouth 2 (two) times daily. 60 tablet 3   ondansetron (ZOFRAN) 4 MG tablet Take 1 tablet (4 mg total) by mouth daily as needed for nausea or vomiting. (Patient not taking: Reported on 08/18/2021) 15 tablet 0   warfarin (COUMADIN) 7.5 MG tablet Take 1 tablet (7.5 mg total) by mouth daily. 225 tablet 0   No current facility-administered medications for this visit.    Physical Exam: BP 118/74   Pulse 94   Resp 20   Ht 5\' 4"  (1.626 m)   Wt 175 lb (79.4 kg)   SpO2  96% Comment: RA  BMI 30.04 kg/m  She looks well.  She does not say much due to the language barrier. Cardiac exam shows a regular rate and rhythm with normal heart sounds.  There is no murmur. Lungs are clear. The chest incision is healing well and the sternum is stable. There is no peripheral edema.  Diagnostic Tests:  Narrative & Impression  CLINICAL DATA:  Status post mitral valve repair.   EXAM: CHEST - 2 VIEW   COMPARISON:  Jul 21, 2021.   FINDINGS: Stable cardiomediastinal silhouette. No pneumothorax is noted. Left lung is clear. Mild right basilar atelectasis and small right pleural effusion is noted. Bony thorax is unremarkable.   IMPRESSION: Mild right basilar atelectasis and small right pleural effusion is noted.     Electronically Signed   By: Jul 23, 2021 M.D.   On: 08/12/2021 16:44      Impression:  Overall I think she is doing fairly well following her surgery.  It is hard to know what her baseline level of functioning was preoperatively because her daughter said that she was not doing much before.  She has had chronic problems with nausea and decreased appetite.  Her vital signs are stable and she appears to be healing well.  She has minimal right basilar atelectasis and a small amount of right pleural fluid which is not surprising at this point.  She has  no peripheral edema and I would expect this to resolve without incident.  With a tissue mitral valve I think she only needs to be on Coumadin for 3 months as far as the valve is concerned as long as she is maintaining sinus rhythm.  She could then be converted to aspirin alone.  I will leave that decision up to cardiology.  I encouraged her to continue walking as much as possible.  I asked her not to do any heavy lifting of more than 10 pounds for 3 months postoperatively.  Plan:  She is going to follow-up with cardiology and will have a follow-up baseline echocardiogram.  She will return to see me if she  has any problems with her incisions.   Alleen Borne, MD [facility] [phone]

## 2021-09-07 ENCOUNTER — Ambulatory Visit: Payer: Medicaid Other

## 2021-09-14 ENCOUNTER — Ambulatory Visit (INDEPENDENT_AMBULATORY_CARE_PROVIDER_SITE_OTHER): Payer: Self-pay | Admitting: Pharmacist

## 2021-09-14 DIAGNOSIS — Z8673 Personal history of transient ischemic attack (TIA), and cerebral infarction without residual deficits: Secondary | ICD-10-CM

## 2021-09-14 DIAGNOSIS — I4892 Unspecified atrial flutter: Secondary | ICD-10-CM

## 2021-09-14 DIAGNOSIS — I513 Intracardiac thrombosis, not elsewhere classified: Secondary | ICD-10-CM

## 2021-09-14 DIAGNOSIS — I4891 Unspecified atrial fibrillation: Secondary | ICD-10-CM

## 2021-09-14 DIAGNOSIS — Z7901 Long term (current) use of anticoagulants: Secondary | ICD-10-CM

## 2021-09-14 DIAGNOSIS — Z952 Presence of prosthetic heart valve: Secondary | ICD-10-CM

## 2021-09-14 LAB — POCT INR: INR: 3.5 — AB (ref 2.0–3.0)

## 2021-09-14 NOTE — Progress Notes (Signed)
Evaluation and management procedures were performed by the Clinical Pharmacy Practitioner under my supervision and collaboration. I have reviewed the Practitioner's note and chart, and I agree with the management and plan as documented above. ° °

## 2021-09-14 NOTE — Patient Instructions (Signed)
Patient instructed to take medications as defined in the Anti-coagulation Track section of this encounter.  Patient instructed to take today's dose.  Patient instructed to take one (1) of your yellow-colored 7.5mg  strength warfarin tablets, by mouth, once-daily. Patient verbalized understanding of these instructions.
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
## 2021-09-16 ENCOUNTER — Other Ambulatory Visit (HOSPITAL_COMMUNITY): Payer: Self-pay

## 2021-09-17 ENCOUNTER — Other Ambulatory Visit (HOSPITAL_COMMUNITY): Payer: Self-pay

## 2021-09-18 ENCOUNTER — Other Ambulatory Visit (HOSPITAL_COMMUNITY): Payer: Self-pay

## 2021-09-28 ENCOUNTER — Other Ambulatory Visit (HOSPITAL_COMMUNITY): Payer: Self-pay

## 2021-10-12 ENCOUNTER — Ambulatory Visit (INDEPENDENT_AMBULATORY_CARE_PROVIDER_SITE_OTHER): Payer: Self-pay | Admitting: Pharmacist

## 2021-10-12 DIAGNOSIS — I052 Rheumatic mitral stenosis with insufficiency: Secondary | ICD-10-CM

## 2021-10-12 DIAGNOSIS — I4891 Unspecified atrial fibrillation: Secondary | ICD-10-CM

## 2021-10-12 DIAGNOSIS — I513 Intracardiac thrombosis, not elsewhere classified: Secondary | ICD-10-CM

## 2021-10-12 DIAGNOSIS — Z7901 Long term (current) use of anticoagulants: Secondary | ICD-10-CM

## 2021-10-12 DIAGNOSIS — Z8673 Personal history of transient ischemic attack (TIA), and cerebral infarction without residual deficits: Secondary | ICD-10-CM

## 2021-10-12 DIAGNOSIS — I4892 Unspecified atrial flutter: Secondary | ICD-10-CM

## 2021-10-12 LAB — POCT INR: INR: 3.5 — AB (ref 2.0–3.0)

## 2021-10-12 NOTE — Patient Instructions (Signed)
Patient instructed to take medications as defined in the Anti-coagulation Track section of this encounter.  Patient instructed to take today's dose.  Patient instructed to take  one (1) of your 7.5mg  yellow-warfarin tablets by mouth, once-daily on Sundays, Tuesdays, Thursdays and Saturdays. On Mondays, Wednesdays and Fridays, take ONLY one-half (1/2) tablet.  Patient verbalized understanding of these instructions.

## 2021-10-12 NOTE — Progress Notes (Signed)
INTERNAL MEDICINE TEACHING ATTENDING ADDENDUM   I agree with pharmacy recommendations as outlined in their note.   Samanvitha Germany, MD  

## 2021-10-12 NOTE — Addendum Note (Signed)
Addended by: Burnell Blanks on: 10/12/2021 10:16 AM   Modules accepted: Orders
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
## 2021-10-12 NOTE — Progress Notes (Signed)
Placed order for cards referral per Dr. Laneta Simmers recommendations.

## 2021-10-22 ENCOUNTER — Other Ambulatory Visit (HOSPITAL_COMMUNITY): Payer: Self-pay

## 2021-11-09 ENCOUNTER — Ambulatory Visit: Payer: Self-pay

## 2021-11-09 ENCOUNTER — Ambulatory Visit (INDEPENDENT_AMBULATORY_CARE_PROVIDER_SITE_OTHER): Payer: Medicaid Other | Admitting: Pharmacist

## 2021-11-09 DIAGNOSIS — Z8673 Personal history of transient ischemic attack (TIA), and cerebral infarction without residual deficits: Secondary | ICD-10-CM

## 2021-11-09 DIAGNOSIS — I513 Intracardiac thrombosis, not elsewhere classified: Secondary | ICD-10-CM

## 2021-11-09 DIAGNOSIS — I4891 Unspecified atrial fibrillation: Secondary | ICD-10-CM

## 2021-11-09 DIAGNOSIS — I4892 Unspecified atrial flutter: Secondary | ICD-10-CM

## 2021-11-09 DIAGNOSIS — Z7901 Long term (current) use of anticoagulants: Secondary | ICD-10-CM

## 2021-11-09 LAB — POCT INR: INR: 2.2 (ref 2.0–3.0)
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
## 2021-11-09 NOTE — Patient Instructions (Signed)
Patient instructed to take medications as defined in the Anti-coagulation Track section of this encounter.  Patient instructed to take today's dose.  Patient instructed to take one tablet by mouth, once daily all days of the week EXCEPT on Wednesdays and Fridays, take only 1/2 tablet on Wednesdays and Fridays. Patient verbalized understanding of these instructions.

## 2021-11-10 ENCOUNTER — Telehealth: Payer: Self-pay | Admitting: Cardiology

## 2021-11-10 NOTE — Telephone Encounter (Signed)
Patient was called using an interpreter to notify her that her appointment with Dr. Izora Ribas was canceled and needs to be rescheduled with Dr. Tomie China at the [facility] office.  Voicemail message was left with daughter Aida Iraq and SMS was left with daughter Carmelia Bake to call to reschedule mother's appointment.

## 2021-11-11 ENCOUNTER — Ambulatory Visit: Payer: Medicaid Other | Admitting: Internal Medicine

## 2021-11-12 ENCOUNTER — Encounter: Payer: Self-pay | Admitting: Cardiology

## 2021-11-12 ENCOUNTER — Ambulatory Visit: Payer: Medicaid Other | Attending: Internal Medicine | Admitting: Cardiology

## 2021-11-12 ENCOUNTER — Ambulatory Visit: Payer: Self-pay | Attending: Cardiology

## 2021-11-12 VITALS — BP 148/80 | HR 79 | Ht 65.0 in | Wt 177.8 lb

## 2021-11-12 DIAGNOSIS — Z8679 Personal history of other diseases of the circulatory system: Secondary | ICD-10-CM

## 2021-11-12 DIAGNOSIS — I4891 Unspecified atrial fibrillation: Secondary | ICD-10-CM | POA: Insufficient documentation

## 2021-11-12 DIAGNOSIS — Z952 Presence of prosthetic heart valve: Secondary | ICD-10-CM

## 2021-11-12 DIAGNOSIS — I480 Paroxysmal atrial fibrillation: Secondary | ICD-10-CM

## 2021-11-12 DIAGNOSIS — E782 Mixed hyperlipidemia: Secondary | ICD-10-CM

## 2021-11-12 DIAGNOSIS — Z9889 Other specified postprocedural states: Secondary | ICD-10-CM

## 2021-11-12 HISTORY — DX: Mixed hyperlipidemia: E78.2

## 2021-11-12 HISTORY — DX: Paroxysmal atrial fibrillation: I48.0

## 2021-11-12 NOTE — Patient Instructions (Addendum)
Medication Instructions:  Your physician recommends that you continue on your current medications as directed. Please refer to the Current Medication list given to you today.  *If you need a refill on your cardiac medications before your next appointment, please call your pharmacy*   Lab Work: None ordered If you have labs (blood work) drawn today and your tests are completely normal, you will receive your results only by: MyChart Message (if you have MyChart) OR A paper copy in the mail If you have any lab test that is abnormal or we need to change your treatment, we will call you to review the results.   Testing/Procedures: Your physician has recommended that you wear an event monitor. Event monitors are medical devices that record the heart's electrical activity. Doctors most often us these monitors to diagnose arrhythmias. Arrhythmias are problems with the speed or rhythm of the heartbeat. The monitor is a small, portable device. You can wear one while you do your normal daily activities. This is usually used to diagnose what is causing palpitations/syncope (passing out). This is a 30 day monitor.   Follow-Up: At [facility], you and your health needs are our priority.  As part of our continuing mission to provide you with exceptional heart care, we have created designated Provider Care Teams.  These Care Teams include your primary Cardiologist (physician) and Advanced Practice Providers (APPs -  Physician Assistants and Nurse Practitioners) who all work together to provide you with the care you need, when you need it.  We recommend signing up for the patient portal called "MyChart".  Sign up information is provided on this After Visit Summary.  MyChart is used to connect with patients for Virtual Visits (Telemedicine).  Patients are able to view lab/test results, encounter notes, upcoming appointments, etc.  Non-urgent messages can be sent to your provider as well.   To learn more about  what you can do with MyChart, go to [email or web address].    Your next appointment:   9 month(s)  The format for your next appointment:   In Person  Provider:   Rajan Revankar, MD   Other Instructions NA  
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
## 2021-11-16 ENCOUNTER — Telehealth: Payer: Self-pay | Admitting: Cardiology

## 2021-11-16 NOTE — Telephone Encounter (Signed)
Calling to report results. **Hung up before I could transfer**

## 2021-11-17 ENCOUNTER — Other Ambulatory Visit: Payer: Self-pay | Admitting: Physician Assistant

## 2021-11-17 ENCOUNTER — Other Ambulatory Visit (HOSPITAL_COMMUNITY): Payer: Self-pay

## 2021-11-17 ENCOUNTER — Other Ambulatory Visit: Payer: Self-pay | Admitting: Internal Medicine

## 2021-11-18 ENCOUNTER — Other Ambulatory Visit (HOSPITAL_COMMUNITY): Payer: Self-pay

## 2021-11-18 MED FILL — Metoprolol Tartrate Tab 25 MG: ORAL | 30 days supply | Qty: 60 | Fill #0 | Status: AC

## 2021-11-19 ENCOUNTER — Other Ambulatory Visit (HOSPITAL_COMMUNITY): Payer: Self-pay

## 2021-11-20 NOTE — Progress Notes (Signed)
Referral received from Dr Lennox Pippins for second opinion from [facility].    Per 9/14 [place] note:  Anticoagulation and history of atrial fibrillation: I reviewed my cardiac surgeons note.  It is mentioned that if the patient has stable sinus rhythm then she can come off anticoagulation.  My only concern is that she has had a history of left atrial thrombus and stroke.  I would like to get a second opinion from my valve clinic colleagues about anticoagulation.  In the interim I will do a 1 month Preventice monitoring to see the stability of her rhythm.  She has not experienced any palpitations or any such issues.  Benefits and potential risks of anticoagulation explained and she vocalized understanding and questions were answered to her satisfaction.  We will continue to follow her up after her valve clinic appointment.  Per Dr Vivi Martens 6/14 note: With a tissue mitral valve I think she only needs to be on Coumadin for 3 months as far as the valve is concerned as long as she is maintaining sinus rhythm.  She could then be converted to aspirin alone.  I will leave that decision up to cardiology.  Pt noted to be in NSR on 9/14 EKG and now planning to wear a 1 month monitor.  Pt also underwent MAZE and LAA Clip with her mitral valve surgery.  Discussed with Dr Cyndia Bent and he felt that the pt did not require an office visit with the [place] team to discuss this concern. Per Dr Cyndia Bent, the patient is safe to come off coumadin and tx to only ASA or if Dr Lennox Pippins wants, she can stay on coumadin as long as shes tolerating it but its totally up to him.  I will forward this message to Dr Lennox Pippins and cancel structural heart referral.

## 2021-11-24 ENCOUNTER — Telehealth: Payer: Self-pay | Admitting: Cardiology

## 2021-11-24 NOTE — Telephone Encounter (Signed)
Daughter states that pt's devices is making  a noise similar to the [employer]" notification. They would like a callback regarding this matter. Please advise

## 2021-11-24 NOTE — Telephone Encounter (Signed)
Contact us with questions about wearing and using your monitor.  Phone: [phone] option 1, 1, 1  Daughter verbalized understanding and had no additional questions.

## 2021-12-01 ENCOUNTER — Encounter (HOSPITAL_COMMUNITY): Payer: Self-pay | Admitting: Emergency Medicine

## 2021-12-01 ENCOUNTER — Other Ambulatory Visit: Payer: Self-pay

## 2021-12-01 ENCOUNTER — Emergency Department (HOSPITAL_COMMUNITY): Payer: Medicaid Other

## 2021-12-01 ENCOUNTER — Emergency Department (HOSPITAL_COMMUNITY)
Admission: EM | Admit: 2021-12-01 | Discharge: 2021-12-02 | Discharge status: Against Medical Advice | Payer: Medicaid Other | Attending: Student | Admitting: Student

## 2021-12-01 ENCOUNTER — Encounter: Payer: Self-pay | Admitting: Family Medicine

## 2021-12-01 ENCOUNTER — Ambulatory Visit (INDEPENDENT_AMBULATORY_CARE_PROVIDER_SITE_OTHER): Payer: Self-pay | Admitting: Family Medicine

## 2021-12-01 ENCOUNTER — Telehealth: Payer: Self-pay | Admitting: Pharmacist

## 2021-12-01 VITALS — BP 153/72 | HR 76 | Temp 98.3°F | Wt 179.1 lb

## 2021-12-01 DIAGNOSIS — R42 Dizziness and giddiness: Secondary | ICD-10-CM | POA: Insufficient documentation

## 2021-12-01 DIAGNOSIS — R1032 Left lower quadrant pain: Secondary | ICD-10-CM | POA: Insufficient documentation

## 2021-12-01 DIAGNOSIS — R109 Unspecified abdominal pain: Secondary | ICD-10-CM

## 2021-12-01 DIAGNOSIS — R300 Dysuria: Secondary | ICD-10-CM | POA: Insufficient documentation

## 2021-12-01 DIAGNOSIS — Z5321 Procedure and treatment not carried out due to patient leaving prior to being seen by health care provider: Secondary | ICD-10-CM | POA: Insufficient documentation

## 2021-12-01 DIAGNOSIS — R0789 Other chest pain: Secondary | ICD-10-CM | POA: Insufficient documentation

## 2021-12-01 DIAGNOSIS — I10 Essential (primary) hypertension: Secondary | ICD-10-CM

## 2021-12-01 DIAGNOSIS — R1084 Generalized abdominal pain: Secondary | ICD-10-CM

## 2021-12-01 DIAGNOSIS — R319 Hematuria, unspecified: Secondary | ICD-10-CM | POA: Insufficient documentation

## 2021-12-01 DIAGNOSIS — R0602 Shortness of breath: Secondary | ICD-10-CM | POA: Insufficient documentation

## 2021-12-01 DIAGNOSIS — R5383 Other fatigue: Secondary | ICD-10-CM | POA: Insufficient documentation

## 2021-12-01 DIAGNOSIS — N6320 Unspecified lump in the left breast, unspecified quadrant: Secondary | ICD-10-CM

## 2021-12-01 DIAGNOSIS — Z0000 Encounter for general adult medical examination without abnormal findings: Secondary | ICD-10-CM

## 2021-12-01 DIAGNOSIS — R310 Gross hematuria: Secondary | ICD-10-CM

## 2021-12-01 HISTORY — DX: Dizziness and giddiness: R42

## 2021-12-01 HISTORY — DX: Unspecified abdominal pain: R10.9

## 2021-12-01 LAB — CBC WITH DIFFERENTIAL/PLATELET
Abs Immature Granulocytes: 0.04 10*3/uL (ref 0.00–0.07)
Basophils Absolute: 0.1 10*3/uL (ref 0.0–0.1)
Basophils Relative: 1 %
Eosinophils Absolute: 0.2 10*3/uL (ref 0.0–0.5)
Eosinophils Relative: 3 %
HCT: 36.7 % (ref 36.0–46.0)
Hemoglobin: 11.4 g/dL — ABNORMAL LOW (ref 12.0–15.0)
Immature Granulocytes: 1 %
Lymphocytes Relative: 34 %
Lymphs Abs: 2.7 10*3/uL (ref 0.7–4.0)
MCH: 25.1 pg — ABNORMAL LOW (ref 26.0–34.0)
MCHC: 31.1 g/dL (ref 30.0–36.0)
MCV: 80.8 fL (ref 80.0–100.0)
Monocytes Absolute: 0.9 10*3/uL (ref 0.1–1.0)
Monocytes Relative: 11 %
Neutro Abs: 4.2 10*3/uL (ref 1.7–7.7)
Neutrophils Relative %: 50 %
Platelets: 297 10*3/uL (ref 150–400)
RBC: 4.54 MIL/uL (ref 3.87–5.11)
RDW: 16.6 % — ABNORMAL HIGH (ref 11.5–15.5)
WBC: 8 10*3/uL (ref 4.0–10.5)
nRBC: 0 % (ref 0.0–0.2)

## 2021-12-01 LAB — PROTIME-INR
INR: 3.5 — ABNORMAL HIGH (ref 0.8–1.2)
Prothrombin Time: 34.5 seconds — ABNORMAL HIGH (ref 11.4–15.2)

## 2021-12-01 LAB — URINALYSIS, ROUTINE W REFLEX MICROSCOPIC
Bilirubin Urine: NEGATIVE
Glucose, UA: NEGATIVE mg/dL
Ketones, ur: NEGATIVE mg/dL
Leukocytes,Ua: NEGATIVE
Nitrite: NEGATIVE
Protein, ur: NEGATIVE mg/dL
Specific Gravity, Urine: 1.006 (ref 1.005–1.030)
pH: 6 (ref 5.0–8.0)

## 2021-12-01 LAB — POCT URINALYSIS DIPSTICK
Bilirubin, UA: NEGATIVE
Glucose, UA: NEGATIVE
Ketones, UA: NEGATIVE
Nitrite, UA: NEGATIVE
Protein, UA: NEGATIVE
Spec Grav, UA: 1.015 (ref 1.010–1.025)
Urobilinogen, UA: 1 E.U./dL
pH, UA: 5.5 (ref 5.0–8.0)

## 2021-12-01 LAB — COMPREHENSIVE METABOLIC PANEL
ALT: 31 U/L (ref 0–44)
AST: 26 U/L (ref 15–41)
Albumin: 3.8 g/dL (ref 3.5–5.0)
Alkaline Phosphatase: 108 U/L (ref 38–126)
Anion gap: 8 (ref 5–15)
BUN: 15 mg/dL (ref 6–20)
CO2: 26 mmol/L (ref 22–32)
Calcium: 9.4 mg/dL (ref 8.9–10.3)
Chloride: 106 mmol/L (ref 98–111)
Creatinine, Ser: 0.62 mg/dL (ref 0.44–1.00)
GFR, Estimated: >60 mL/min (ref >60)
Glucose, Bld: 108 mg/dL — ABNORMAL HIGH (ref 70–99)
Potassium: 4 mmol/L (ref 3.5–5.1)
Sodium: 140 mmol/L (ref 135–145)
Total Bilirubin: 0.5 mg/dL (ref 0.3–1.2)
Total Protein: 8.1 g/dL (ref 6.5–8.1)

## 2021-12-01 LAB — TROPONIN I (HIGH SENSITIVITY): Troponin I (High Sensitivity): 5 ng/L (ref <18)

## 2021-12-01 LAB — LIPASE, BLOOD: Lipase: 38 U/L (ref 11–51)

## 2021-12-01 NOTE — Assessment & Plan Note (Signed)
Patient reporting back pain/left flank pain, abdominal pain worse on the left side of the abdomen.  Abdominal pain associate with dysuria and hematuria.  Symptoms ongoing for 2 weeks.  Patient reports today the urine color does not look as bright as before.  Diffuse tenderness to light palpation, with guarding, worse on left.  No visible pulsation, mass or bruising appreciated.  Although her urine has trace hematuria, small leukocytes and negative  Nausea, vomiting and fever, she has pronounced left CVA tenderness with abdominal pain and guarding.  Her presentation and physical exam concerning for pyelonephritis?  Renal hematoma ?  Or acute abdominal process.  Patient reported to emergency department for further evaluation and management.

## 2021-12-01 NOTE — Assessment & Plan Note (Signed)
Order for diagnostic mammogram and ultrasound was placed.

## 2021-12-01 NOTE — ED Provider Triage Note (Signed)
Emergency Medicine Provider Triage Evaluation Note  Abigail Butler , a 56 y.o. female  was evaluated in triage.  Pt complains of left-sided abdominal pain, left flank pain, fatigue, chest wall pain and shortness of breath x2 weeks.  She also having hematuria and dysuria.  Seen by family medicine, sent to ED for further evaluation.  Patient is on Coumadin, recent surgery to the chest wall..  Review of Systems  Per HPI  Physical Exam  BP (!) 130/58 (BP Location: Right Arm)   Pulse 69   Temp 98.4 F (36.9 C) (Oral)   Resp 16   Ht 5\' 5"  (1.651 m)   Wt 81.2 kg   SpO2 94%   BMI 29.79 kg/m  Gen:   Awake, no distress   Resp:  Normal effort  MSK:   Moves extremities without difficulty  Other:  Left lower quadrant abdominal pain, left CVA tenderness.    Medical Decision Making  Medically screening exam initiated at 4:39 PM.  Appropriate orders placed.  Haddy Stefanick was informed that the remainder of the evaluation will be completed by another provider, this initial triage assessment does not replace that evaluation, and the importance of remaining in the ED until their evaluation is complete.     Sherrill Raring, PA-C 12/01/21 1640

## 2021-12-01 NOTE — ED Triage Notes (Signed)
Patient arrives in wheelchair by POV with son states they were at the Internal medicine center for dizziness, lightheadedness, fatigue, abdominal pain, back pain and hematuria x 2 weeks. Patient had urine dipstick and no other tests done.

## 2021-12-01 NOTE — Assessment & Plan Note (Signed)
Patient presented to clinic with dizziness and lightheadedness with mild diffuse headache.  Her blood pressure noted to be 153/72.  Patient denies visual changes, nausea, vomiting.  No focal neurological deficits.  Her speech was clear no facial asymmetry.  Although patient reports feeling fatigue her strength is equal BL upper and lower extremities.  Patient is on Coumadin.  Her last INR was 2.2 on September 11.  Highly unlikely her dizziness is from intracranial pathology.
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
## 2021-12-01 NOTE — Assessment & Plan Note (Signed)
Patient reports abdominal pain with hematuria, dysuria, lower back pain and left flank pain.  Guarding to light palpation.  Pronounced left CVA tenderness.  Patient transported to ED for further management.

## 2021-12-01 NOTE — Patient Instructions (Signed)
Patient referred to emergency department for further evaluation and management.  Report was given to charge nurse Carlis Abbott.  Patient was transported to emergency department on wheelchair by clinic staff member.

## 2021-12-01 NOTE — Telephone Encounter (Signed)
Patient's daughter called stating that her mom is endorsing what appears to be blood in  her urine. Started today. Upon questioning, denies fever but states she has had some lower back/CVAT/pain and dysuria.  [place] indicates there is an opening at 3:00 pm today. I called the patient's daughter and she states she can have her mom in the clinic today for that [place] appointment.

## 2021-12-02 ENCOUNTER — Other Ambulatory Visit: Payer: Self-pay | Admitting: Family Medicine

## 2021-12-02 DIAGNOSIS — N12 Tubulo-interstitial nephritis, not specified as acute or chronic: Secondary | ICD-10-CM

## 2021-12-02 LAB — TROPONIN I (HIGH SENSITIVITY): Troponin I (High Sensitivity): 6 ng/L (ref <18)

## 2021-12-02 MED ORDER — SULFAMETHOXAZOLE-TRIMETHOPRIM 800-160 MG PO TABS: 1.0000 | ORAL_TABLET | Freq: Two times a day (BID) | ORAL | 0 refills | Status: AC

## 2021-12-02 NOTE — Progress Notes (Signed)
Internal Medicine Clinic Attending  I saw and evaluated the patient.  I personally confirmed the key portions of the history and exam documented by Dr. Multani and I reviewed pertinent patient test results.  The assessment, diagnosis, and plan were formulated together and I agree with the documentation in the resident's note.  

## 2021-12-02 NOTE — ED Notes (Signed)
Patient states wait is too long and she is leaving 

## 2021-12-03 ENCOUNTER — Other Ambulatory Visit: Payer: Self-pay | Admitting: Family Medicine

## 2021-12-03 ENCOUNTER — Ambulatory Visit: Payer: Medicaid Other | Attending: Cardiology | Admitting: Cardiology

## 2021-12-03 ENCOUNTER — Encounter: Payer: Self-pay | Admitting: Cardiology

## 2021-12-03 VITALS — BP 142/76 | HR 78 | Ht 65.0 in | Wt 179.6 lb

## 2021-12-03 DIAGNOSIS — Z9889 Other specified postprocedural states: Secondary | ICD-10-CM

## 2021-12-03 DIAGNOSIS — R109 Unspecified abdominal pain: Secondary | ICD-10-CM

## 2021-12-03 DIAGNOSIS — I480 Paroxysmal atrial fibrillation: Secondary | ICD-10-CM

## 2021-12-03 DIAGNOSIS — R1084 Generalized abdominal pain: Secondary | ICD-10-CM

## 2021-12-03 DIAGNOSIS — E782 Mixed hyperlipidemia: Secondary | ICD-10-CM

## 2021-12-03 DIAGNOSIS — Z8679 Personal history of other diseases of the circulatory system: Secondary | ICD-10-CM

## 2021-12-03 DIAGNOSIS — Z952 Presence of prosthetic heart valve: Secondary | ICD-10-CM

## 2021-12-03 NOTE — Patient Instructions (Signed)

## 2021-12-03 NOTE — Addendum Note (Signed)
Addended by: Ander Gaster on: 12/03/2021 09:03 AM   Modules accepted: Orders

## 2021-12-03 NOTE — Progress Notes (Signed)
Placed STAT order for CT abdomen/pelvis to R/O acute abdominal pathology
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
## 2021-12-07 ENCOUNTER — Ambulatory Visit: Payer: Medicaid Other

## 2021-12-14 ENCOUNTER — Ambulatory Visit (INDEPENDENT_AMBULATORY_CARE_PROVIDER_SITE_OTHER): Payer: Self-pay | Admitting: Pharmacist

## 2021-12-14 ENCOUNTER — Other Ambulatory Visit (HOSPITAL_COMMUNITY): Payer: Self-pay

## 2021-12-14 DIAGNOSIS — I513 Intracardiac thrombosis, not elsewhere classified: Secondary | ICD-10-CM

## 2021-12-14 DIAGNOSIS — Z7901 Long term (current) use of anticoagulants: Secondary | ICD-10-CM

## 2021-12-14 DIAGNOSIS — Z8673 Personal history of transient ischemic attack (TIA), and cerebral infarction without residual deficits: Secondary | ICD-10-CM

## 2021-12-14 DIAGNOSIS — I4892 Unspecified atrial flutter: Secondary | ICD-10-CM

## 2021-12-14 LAB — POCT INR: INR: 3 (ref 2.0–3.0)

## 2021-12-14 MED FILL — Metoprolol Tartrate Tab 25 MG: ORAL | 30 days supply | Qty: 60 | Fill #1 | Status: AC

## 2021-12-14 NOTE — Patient Instructions (Signed)
Patient instructed to take medications as defined in the Anti-coagulation Track section of this encounter.  Patient instructed to take today's dose.  Patient instructed to take one tablet by mouth, once daily all days of the week EXCEPT on Wednesdays and Fridays, take only 1/2 tablet on Wednesdays and Fridays,  Patient verbalized understanding of these instructions.
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
## 2021-12-14 NOTE — Progress Notes (Signed)
Evaluation and management procedures were performed by the Clinical Pharmacy Practitioner under my supervision and collaboration. I have reviewed the Practitioner's note and chart, and I agree with the management and plan as documented above. ° °

## 2021-12-17 ENCOUNTER — Ambulatory Visit (HOSPITAL_COMMUNITY): Admission: RE | Admit: 2021-12-17 | Payer: Medicaid Other | Source: Ambulatory Visit

## 2022-01-01 ENCOUNTER — Ambulatory Visit (HOSPITAL_BASED_OUTPATIENT_CLINIC_OR_DEPARTMENT_OTHER)
Admission: RE | Admit: 2022-01-01 | Discharge: 2022-01-01 | Disposition: A | Discharge status: Home / Self Care | Payer: Medicaid Other | Source: Ambulatory Visit | Attending: Internal Medicine | Admitting: Internal Medicine

## 2022-01-01 DIAGNOSIS — R109 Unspecified abdominal pain: Secondary | ICD-10-CM | POA: Insufficient documentation

## 2022-01-01 DIAGNOSIS — R1084 Generalized abdominal pain: Secondary | ICD-10-CM | POA: Insufficient documentation

## 2022-01-01 MED ORDER — IOHEXOL 300 MG/ML  SOLN
100.0000 mL | Freq: Once | INTRAMUSCULAR | Status: AC | PRN
  Administered 2022-01-01: 100 mL via INTRAVENOUS

## 2022-01-06 ENCOUNTER — Encounter: Payer: Self-pay | Admitting: Cardiology

## 2022-01-06 ENCOUNTER — Ambulatory Visit: Payer: Medicaid Other | Attending: Cardiology | Admitting: Cardiology

## 2022-01-06 VITALS — BP 138/66 | HR 72 | Ht 65.0 in | Wt 181.6 lb

## 2022-01-06 DIAGNOSIS — D6869 Other thrombophilia: Secondary | ICD-10-CM

## 2022-01-06 DIAGNOSIS — I480 Paroxysmal atrial fibrillation: Secondary | ICD-10-CM

## 2022-01-06 NOTE — Patient Instructions (Signed)
Medication Instructions:  Your physician recommends that you continue on your current medications as directed. Please refer to the Current Medication list given to you today.  *If you need a refill on your cardiac medications before your next appointment, please call your pharmacy*   Lab Work: None ordered If you have labs (blood work) drawn today and your tests are completely normal, you will receive your results only by: MyChart Message (if you have MyChart) OR A paper copy in the mail If you have any lab test that is abnormal or we need to change your treatment, we will call you to review the results.   Testing/Procedures: None ordered   Follow-Up: At [facility], you and your health needs are our priority.  As part of our continuing mission to provide you with exceptional heart care, we have created designated Provider Care Teams.  These Care Teams include your primary Cardiologist (physician) and Advanced Practice Providers (APPs -  Physician Assistants and Nurse Practitioners) who all work together to provide you with the care you need, when you need it.  We recommend signing up for the patient portal called "MyChart".  Sign up information is provided on this After Visit Summary.  MyChart is used to connect with patients for Virtual Visits (Telemedicine).  Patients are able to view lab/test results, encounter notes, upcoming appointments, etc.  Non-urgent messages can be sent to your provider as well.   To learn more about what you can do with MyChart, go to [email or web address].    Your next appointment:   as  needed  The format for your next appointment:   In Person  Provider:   Will Camnitz, MD    Thank you for choosing [facility]!!   Koren Sermersheim, RN [phone]  Other Instructions   Important Information About Sugar           
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
## 2022-01-07 ENCOUNTER — Telehealth: Payer: Self-pay | Admitting: Family Medicine

## 2022-01-07 NOTE — Telephone Encounter (Signed)
Called patient to discuss the lab results.  Patient was unavailable. Could not leave voice message. We will try calling back again later.

## 2022-01-11 ENCOUNTER — Ambulatory Visit: Payer: Medicaid Other

## 2022-01-12 ENCOUNTER — Telehealth: Payer: Self-pay

## 2022-01-12 NOTE — Telephone Encounter (Signed)
Left vm to call back

## 2022-01-12 NOTE — Telephone Encounter (Signed)
-----   Message from Rajan R Revankar, MD sent at 01/06/2022  1:55 PM EST ----- On the recommendations of the cardiac surgeon and the electrophysiologist please let the patient know that she can stop her warfarin. ----- Message ----- From: Camnitz, Will Martin, MD Sent: 01/06/2022   1:28 PM EST To: Rajan R Revankar, MD; #   

## 2022-01-15 ENCOUNTER — Other Ambulatory Visit (HOSPITAL_COMMUNITY): Payer: Self-pay

## 2022-01-15 MED FILL — Metoprolol Tartrate Tab 25 MG: ORAL | 30 days supply | Qty: 60 | Fill #2 | Status: AC

## 2022-01-18 ENCOUNTER — Telehealth: Payer: Self-pay

## 2022-01-18 NOTE — Telephone Encounter (Signed)
Left vm for pt to callback.  Revankar, Aundra Dubin, MD  Eleonore Chiquito, RN On the recommendations of the cardiac surgeon and the electrophysiologist please let the patient know that she can stop her warfarin.

## 2022-01-18 NOTE — Telephone Encounter (Signed)
-----   Message from Garwin Brothers, MD sent at 01/06/2022  1:55 PM EST ----- On the recommendations of the cardiac surgeon and the electrophysiologist please let the patient know that she can stop her warfarin. ----- Message ----- From: Regan Lemming, MD Sent: 01/06/2022   1:28 PM EST To: Garwin Brothers, MD; #

## 2022-01-22 ENCOUNTER — Other Ambulatory Visit (HOSPITAL_COMMUNITY): Payer: Self-pay

## 2022-02-10 ENCOUNTER — Other Ambulatory Visit (HOSPITAL_BASED_OUTPATIENT_CLINIC_OR_DEPARTMENT_OTHER): Payer: Self-pay

## 2022-02-10 ENCOUNTER — Encounter: Payer: Self-pay | Admitting: Cardiology

## 2022-02-10 ENCOUNTER — Ambulatory Visit: Payer: Medicaid Other | Attending: Cardiology | Admitting: Cardiology

## 2022-02-10 VITALS — BP 160/74 | HR 74 | Ht 61.0 in | Wt 184.0 lb

## 2022-02-10 DIAGNOSIS — Z9889 Other specified postprocedural states: Secondary | ICD-10-CM | POA: Diagnosis not present

## 2022-02-10 DIAGNOSIS — I480 Paroxysmal atrial fibrillation: Secondary | ICD-10-CM

## 2022-02-10 DIAGNOSIS — Z8679 Personal history of other diseases of the circulatory system: Secondary | ICD-10-CM

## 2022-02-10 DIAGNOSIS — Z8673 Personal history of transient ischemic attack (TIA), and cerebral infarction without residual deficits: Secondary | ICD-10-CM

## 2022-02-10 DIAGNOSIS — Z952 Presence of prosthetic heart valve: Secondary | ICD-10-CM

## 2022-02-10 MED ORDER — ATORVASTATIN CALCIUM 40 MG PO TABS
40.0000 mg | ORAL_TABLET | Freq: Every day | ORAL | 3 refills | Status: DC
  Filled 2022-02-10: qty 90, 90d supply, fill #0
  Filled 2022-05-19: qty 90, 90d supply, fill #1
  Filled 2022-08-20 (×2): qty 90, 90d supply, fill #2
  Filled 2022-11-19 (×2): qty 90, 90d supply, fill #3

## 2022-02-10 MED ORDER — FUROSEMIDE 20 MG PO TABS
20.0000 mg | ORAL_TABLET | Freq: Every day | ORAL | 3 refills | Status: DC
  Filled 2022-02-10: qty 90, 90d supply, fill #0

## 2022-02-10 MED ORDER — METOPROLOL TARTRATE 25 MG PO TABS
25.0000 mg | ORAL_TABLET | Freq: Two times a day (BID) | ORAL | 3 refills | Status: DC
  Filled 2022-02-10: qty 180, 90d supply, fill #0
  Filled 2022-05-19: qty 180, 90d supply, fill #1
  Filled 2022-08-20 – 2022-08-23 (×3): qty 180, 90d supply, fill #2
  Filled 2022-11-19 (×2): qty 180, 90d supply, fill #3

## 2022-02-10 MED ORDER — FUROSEMIDE 20 MG PO TABS
20.0000 mg | ORAL_TABLET | Freq: Every day | ORAL | 3 refills | Status: DC
  Filled 2022-02-10: qty 90, 90d supply, fill #0
  Filled 2022-05-19: qty 90, 90d supply, fill #1
  Filled 2022-08-20 (×2): qty 90, 90d supply, fill #2
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
## 2022-02-10 NOTE — Patient Instructions (Signed)
Medication Instructions:  Your physician has recommended you make the following change in your medication:   Stop Coumadin (Warfarin)  Start Furosemide 20 mg daily.  *If you need a refill on your cardiac medications before your next appointment, please call your pharmacy*   Lab Work: Your physician recommends that you return for lab work in: 3 weeks for a BMET.  If you have labs (blood work) drawn today and your tests are completely normal, you will receive your results only by: MyChart Message (if you have MyChart) OR A paper copy in the mail If you have any lab test that is abnormal or we need to change your treatment, we will call you to review the results.   Testing/Procedures: None ordered   Follow-Up: At [facility], you and your health needs are our priority.  As part of our continuing mission to provide you with exceptional heart care, we have created designated Provider Care Teams.  These Care Teams include your primary Cardiologist (physician) and Advanced Practice Providers (APPs -  Physician Assistants and Nurse Practitioners) who all work together to provide you with the care you need, when you need it.  We recommend signing up for the patient portal called "MyChart".  Sign up information is provided on this After Visit Summary.  MyChart is used to connect with patients for Virtual Visits (Telemedicine).  Patients are able to view lab/test results, encounter notes, upcoming appointments, etc.  Non-urgent messages can be sent to your provider as well.   To learn more about what you can do with MyChart, go to [email or web address].    Your next appointment:   9 month(s)  The format for your next appointment:   In Person  Provider:   Belva Crome, MD    Other Instructions none  Important Information About Sugar

## 2022-02-15 ENCOUNTER — Ambulatory Visit
Admission: RE | Admit: 2022-02-15 | Discharge: 2022-02-15 | Disposition: A | Discharge status: Home / Self Care | Payer: Medicaid Other | Source: Ambulatory Visit | Attending: Internal Medicine | Admitting: Internal Medicine

## 2022-02-15 ENCOUNTER — Ambulatory Visit: Payer: Medicaid Other

## 2022-02-15 DIAGNOSIS — N6320 Unspecified lump in the left breast, unspecified quadrant: Secondary | ICD-10-CM

## 2022-03-02 ENCOUNTER — Ambulatory Visit: Payer: Medicaid Other | Attending: Cardiology

## 2022-03-02 DIAGNOSIS — I480 Paroxysmal atrial fibrillation: Secondary | ICD-10-CM

## 2022-03-02 NOTE — Progress Notes (Signed)
   Nurse Visit   Date of Encounter: 03/02/2022 ID: [name], DOB [birth year], MRN [id number]  PCP:  Linus Galas, [place] [facility] Cardiologist:  Jenean Lindau, MD      Visit Details   VS:  There were no vitals taken for this visit. , BMI There is no height or weight on file to calculate BMI.  Wt Readings from Last 3 Encounters:  02/10/22 184 lb (83.5 kg)  01/06/22 181 lb 9.6 oz (82.4 kg)  12/03/21 179 lb 9.6 oz (81.5 kg)     Reason for visit: pulse blood, pressure check and will bring her blood pressure log from home. She will have a Chem-7 at that time.  Performed today: HR, BP- Sent message to Dr. Geraldo Pitter regarding Nurse Visit. Pt had not gotten a BP monitor so she did not have any readings from Home. She is going to Lab for Chem 7 as ordered. Changes (medications, testing, etc.) : None Length of Visit: 15 minutes  Per Dr. Geraldo Pitter:  "She will be back in 2 weeks for a pulse blood pressure check and will bring her blood pressure log from home. She will have a Chem-7 at that time. " Medications Adjustments/Labs and Tests Ordered:  No orders of the defined types were placed in this encounter.  No orders of the defined types were placed in this encounter.    Signed, Tyler Pita, RN  03/02/2022 3:04 PM

## 2022-03-04 LAB — BASIC METABOLIC PANEL
BUN/Creatinine Ratio: 16 (ref 9–23)
BUN: 10 mg/dL (ref 6–24)
CO2: 23 mmol/L (ref 20–29)
Calcium: 8.9 mg/dL (ref 8.7–10.2)
Chloride: 100 mmol/L (ref 96–106)
Creatinine, Ser: 0.61 mg/dL (ref 0.57–1.00)
Glucose: 113 mg/dL — ABNORMAL HIGH (ref 70–99)
Potassium: 4.2 mmol/L (ref 3.5–5.2)
Sodium: 137 mmol/L (ref 134–144)
eGFR: 104 mL/min/{1.73_m2} (ref >59)

## 2022-03-05 NOTE — Progress Notes (Signed)
Abigail Butler per DPR aware to fu with PCP regarding BP as we are not in [facility] regularly. Abigail Butler verbalized understanding and had no additional questions.

## 2022-03-16 ENCOUNTER — Encounter: Payer: Medicaid Other | Admitting: Student

## 2022-03-23 ENCOUNTER — Encounter: Payer: Self-pay | Admitting: Student

## 2022-03-23 ENCOUNTER — Ambulatory Visit (INDEPENDENT_AMBULATORY_CARE_PROVIDER_SITE_OTHER): Payer: Medicaid Other | Admitting: Student

## 2022-03-23 ENCOUNTER — Ambulatory Visit (HOSPITAL_COMMUNITY)
Admission: RE | Admit: 2022-03-23 | Discharge: 2022-03-23 | Disposition: A | Discharge status: Home / Self Care | Payer: Medicaid Other | Source: Ambulatory Visit | Attending: Internal Medicine | Admitting: Internal Medicine

## 2022-03-23 VITALS — BP 130/54 | HR 65 | Temp 98.4°F | Wt 186.6 lb

## 2022-03-23 DIAGNOSIS — M25512 Pain in left shoulder: Secondary | ICD-10-CM

## 2022-03-23 DIAGNOSIS — G8929 Other chronic pain: Secondary | ICD-10-CM

## 2022-03-23 HISTORY — DX: Other chronic pain: G89.29
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
## 2022-03-23 NOTE — Patient Instructions (Signed)
Abigail Butler,  It was a pleasure seeing you in the clinic today.   It appears that your shoulder pain may be from something called frozen shoulder. I have ordered a shoulder x-ray to take a closer look at your shoulder. I have also placed a referral to Orthopedics who will be able to further evaluate and help treat the cause of your pain. They will call you to schedule an appointment. Please follow up in 1 month for your next visit.  Please call our clinic at [phone] if you have any questions or concerns. The best time to call is Monday-Friday from 9am-4pm, but there is someone available 24/7 at the same number. If you need medication refills, please notify your pharmacy one week in advance and they will send [place] a request.   Thank you for letting us take part in your care. We look forward to seeing you next time!

## 2022-03-23 NOTE — Assessment & Plan Note (Signed)
Patient states that she has been experiencing left shoulder pain for the past 8 months. It began shortly before her heart surgery and intensified thereafter. Since that time, her pain has not progressed in severity but she has remained functionally limited due to inability to move her shoulder. She is unable to dress herself or feed herself using LUE. She had prior shoulder films in 2014 showing degenerative changes. Has been keeping her shoulder still to avoid worsening her injury and due to pain. No trauma to her shoulder that she recalls.  On exam, L shoulder with mild swelling when compared to R. No increased warmth, erythema, or deformities noted. Limited passive ROM, able lift past 30 degrees but unable to reach 90 degrees with flexion, extension, or abduction. Active ROM similarly limited. Unable to perform resistance evaluation given pain without resistance. Detailed exams (such as empty can test or Neer test) given markedly limited ROM due to pain.   Given history and exam, findings most suspicious for adhesive capsulitis of L shoulder. She has underlying osteoarthritis in that joint and may have experienced a rotator cuff injury about 8 months ago. Given that she has kept her shoulder still to avoid worsening pain, likely has developed adhesions resulting in adhesive capsulitis. Given how functionally limited she has become over this period, will obtain a repeat shoulder x-ray to evaluate for any changes and will refer to orthopedics for further evaluation and definitive therapy (may require surgical intervention).   Plan: -f/u L shoulder films -referral placed to orthopedic surgery -f/u in 2 months

## 2022-03-24 NOTE — Progress Notes (Signed)
Internal Medicine Clinic Attending  Case discussed with Dr. Jinwala  At the time of the visit.  We reviewed the resident's history and exam and pertinent patient test results.  I agree with the assessment, diagnosis, and plan of care documented in the resident's note.  

## 2022-03-29 NOTE — Progress Notes (Signed)
Patient called.  Unable to reach patient, mailbox full. Normal shoulder imaging. Clinical suspicion remains high for possible adhesive capsulitis. Orthopedic referral placed during visit.

## 2022-04-12 ENCOUNTER — Ambulatory Visit: Payer: Medicaid Other | Admitting: Orthopedic Surgery

## 2022-04-27 ENCOUNTER — Ambulatory Visit: Payer: Medicaid Other | Admitting: Family

## 2022-05-04 ENCOUNTER — Ambulatory Visit (INDEPENDENT_AMBULATORY_CARE_PROVIDER_SITE_OTHER): Payer: Medicaid Other | Admitting: Family

## 2022-05-04 DIAGNOSIS — G8929 Other chronic pain: Secondary | ICD-10-CM | POA: Diagnosis not present

## 2022-05-04 DIAGNOSIS — M25512 Pain in left shoulder: Secondary | ICD-10-CM | POA: Diagnosis not present
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
## 2022-05-05 ENCOUNTER — Encounter: Payer: Self-pay | Admitting: Family

## 2022-05-05 DIAGNOSIS — M25512 Pain in left shoulder: Secondary | ICD-10-CM

## 2022-05-05 DIAGNOSIS — G8929 Other chronic pain: Secondary | ICD-10-CM | POA: Diagnosis not present

## 2022-05-05 MED ORDER — METHYLPREDNISOLONE ACETATE 40 MG/ML IJ SUSP
40.0000 mg | INTRAMUSCULAR | Status: AC | PRN
  Administered 2022-05-05: 40 mg via INTRA_ARTICULAR

## 2022-05-05 MED ORDER — LIDOCAINE HCL 1 % IJ SOLN
5.0000 mL | INTRAMUSCULAR | Status: AC | PRN
  Administered 2022-05-05: 5 mL

## 2022-05-19 ENCOUNTER — Other Ambulatory Visit (HOSPITAL_BASED_OUTPATIENT_CLINIC_OR_DEPARTMENT_OTHER): Payer: Self-pay

## 2022-08-03 ENCOUNTER — Encounter: Payer: Self-pay | Admitting: *Deleted

## 2022-08-18 ENCOUNTER — Telehealth: Payer: Self-pay | Admitting: Cardiology

## 2022-08-18 NOTE — Telephone Encounter (Signed)
Pt c/o of Chest Pain: STAT if CP now or developed within 24 hours  1. Are you having CP right now? No.  Daughter said it's not chest pain, it's a chest tightness.   2. Are you experiencing any other symptoms (ex. SOB, nausea, vomiting, sweating)? Daughter stated when she has the chest tightness she feels dizzy at the same time.  3. How long have you been experiencing CP? Has had tightness for two weeks.   4. Is your CP continuous or coming and going? It comes and goes the chest tightness.   5. Have you taken Nitroglycerin? no ?

## 2022-08-18 NOTE — Telephone Encounter (Signed)
Attempted to call Carmelia Bake, the patient's daughter. Patient's daughter did not answer the phone and voice mail was full so unable to leave a message

## 2022-08-19 NOTE — Telephone Encounter (Signed)
Left vm to call back

## 2022-08-20 ENCOUNTER — Other Ambulatory Visit (HOSPITAL_BASED_OUTPATIENT_CLINIC_OR_DEPARTMENT_OTHER): Payer: Self-pay

## 2022-08-20 NOTE — Telephone Encounter (Signed)
Left a VM to call back

## 2022-08-23 ENCOUNTER — Other Ambulatory Visit (HOSPITAL_BASED_OUTPATIENT_CLINIC_OR_DEPARTMENT_OTHER): Payer: Self-pay

## 2022-09-16 ENCOUNTER — Ambulatory Visit (INDEPENDENT_AMBULATORY_CARE_PROVIDER_SITE_OTHER): Payer: Medicaid Other | Admitting: Student

## 2022-09-16 ENCOUNTER — Other Ambulatory Visit (HOSPITAL_BASED_OUTPATIENT_CLINIC_OR_DEPARTMENT_OTHER): Payer: Self-pay

## 2022-09-16 VITALS — BP 150/55 | HR 58 | Temp 98.3°F | Wt 199.1 lb

## 2022-09-16 DIAGNOSIS — R600 Localized edema: Secondary | ICD-10-CM

## 2022-09-16 DIAGNOSIS — I10 Essential (primary) hypertension: Secondary | ICD-10-CM | POA: Diagnosis not present

## 2022-09-16 DIAGNOSIS — K2100 Gastro-esophageal reflux disease with esophagitis, without bleeding: Secondary | ICD-10-CM

## 2022-09-16 DIAGNOSIS — R7303 Prediabetes: Secondary | ICD-10-CM

## 2022-09-16 DIAGNOSIS — G43909 Migraine, unspecified, not intractable, without status migrainosus: Secondary | ICD-10-CM

## 2022-09-16 DIAGNOSIS — Z952 Presence of prosthetic heart valve: Secondary | ICD-10-CM

## 2022-09-16 DIAGNOSIS — K219 Gastro-esophageal reflux disease without esophagitis: Secondary | ICD-10-CM | POA: Diagnosis not present

## 2022-09-16 DIAGNOSIS — K279 Peptic ulcer, site unspecified, unspecified as acute or chronic, without hemorrhage or perforation: Secondary | ICD-10-CM | POA: Insufficient documentation

## 2022-09-16 HISTORY — DX: Localized edema: R60.0

## 2022-09-16 HISTORY — DX: Migraine, unspecified, not intractable, without status migrainosus: G43.909

## 2022-09-16 MED ORDER — TORSEMIDE 10 MG PO TABS
20.0000 mg | ORAL_TABLET | Freq: Every day | ORAL | 3 refills | Status: DC
  Filled 2022-09-16: qty 60, 30d supply, fill #0

## 2022-09-16 NOTE — Patient Instructions (Addendum)
Thank you, [name] for allowing [place] to provide your care today. Today we discussed headache, chest pain, and leg swelling.    I have ordered the following labs for you:  Lab Orders         CBC no Diff         BMP8+Anion Gap         Hemoglobin A1c         H. pylori antigen, stool      Referrals ordered today:   Referral Orders         Ambulatory referral to Cardiology       I have ordered the following medication/changed the following medications:   Stop the following medications: Medications Discontinued During This Encounter  Medication Reason   furosemide (LASIX) 20 MG tablet    torsemide (DEMADEX) 10 MG tablet    torsemide (DEMADEX) 10 MG tablet      Start the following medications: Meds ordered this encounter  Medications   DISCONTD: torsemide (DEMADEX) 10 MG tablet    Sig: Take 2 tablets (20 mg total) by mouth daily.    Dispense:  60 tablet    Refill:  3   DISCONTD: torsemide (DEMADEX) 10 MG tablet    Sig: Take 2 tablets (20 mg total) by mouth daily.    Dispense:  60 tablet    Refill:  3   torsemide (DEMADEX) 10 MG tablet    Sig: Take 2 tablets (20 mg total) by mouth daily.    Dispense:  60 tablet    Refill:  3     Follow up:  1-2 weeks    We look forward to seeing you next time. Please call our clinic at [phone] if you have any questions or concerns. The best time to call is Monday-Friday from 9am-4pm, but there is someone available 24/7. If after hours or the weekend, call the main hospital number and ask for the Internal Medicine Resident On-Call. If you need medication refills, please notify your pharmacy one week in advance and they will send [place] a request.   Thank you for trusting me with your care. Wishing you the best!  Lovie Macadamia MD [facility]
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
## 2022-09-17 ENCOUNTER — Encounter: Payer: Self-pay | Admitting: Student

## 2022-09-17 ENCOUNTER — Other Ambulatory Visit (HOSPITAL_BASED_OUTPATIENT_CLINIC_OR_DEPARTMENT_OTHER): Payer: Self-pay

## 2022-09-17 ENCOUNTER — Other Ambulatory Visit: Payer: Self-pay

## 2022-09-17 LAB — BMP8+ANION GAP

## 2022-09-17 LAB — CBC
MCH: 27.4 pg (ref 26.6–33.0)
MCHC: 32.4 g/dL (ref 31.5–35.7)
MCV: 84 fL (ref 79–97)
Platelets: 249 10*3/uL (ref 150–450)
RDW: 12.6 % (ref 11.7–15.4)

## 2022-09-17 MED ORDER — TORSEMIDE 10 MG PO TABS
20.0000 mg | ORAL_TABLET | Freq: Every day | ORAL | 3 refills | Status: DC
  Filled 2022-09-17 – 2022-09-20 (×2): qty 60, 30d supply, fill #0
  Filled 2022-11-19 (×3): qty 60, 30d supply, fill #1

## 2022-09-17 MED ORDER — TORSEMIDE 10 MG PO TABS: 20.0000 mg | ORAL_TABLET | Freq: Every day | ORAL | 3 refills | Status: DC

## 2022-09-17 NOTE — Assessment & Plan Note (Addendum)
Patient has history of gastric ulcers, EGD 2017 showed h.pylori infection, chronic gastritis, no dysplasia or cancer, but metaplasia present. Patient says they never completed a medication regiment was a result of this testing and it appears they were lost to f/u. Of note the patient speaks sudanese dialect of Arabic, which is slightly different from conventional arabic. We have permission to contact her daughters for her medical needs. This may have contributed to lapses in care. She was seen by [place] in 2019, where we attempted to initiate therapy for her H.pylori but it was unclear whether she was able to complete her medication regiment. Her symptoms continue to persist, lead to concern that her infection was not able to be treated.   Pt endorses significant epigastric pain, GERD symptoms. Nausea w/o vomiting, no hematemesis, no weight loss, but endorses dark stools occasionally. Some concern for UGI bleeding.   Plan:  H.pylori stool test today  Treatment pending results CBC

## 2022-09-17 NOTE — Assessment & Plan Note (Addendum)
Pt. Hypertensive to 150/55 - has been normotensive in the past on current regiment. Taking her BP medications consistently Lasix 20mg , Metoprolol 25mg  BID. 15 lb weight gain since last visit, 1+ edema to mid shin, concerning for fluid retention. Will treat with diuresis and then revaluate BP next visit  Plan: Return 2 wks

## 2022-09-17 NOTE — Assessment & Plan Note (Signed)
Pt has a hx of MVR about 13 months ago. Has persistent CP at the surgical site. No erythema, fluctuance, or signs of infection, but continued pain. Denies anginal pain. SOB with exertion but no orthopnea, no crackles on exam, favored to be due to deconditioning. Pain when bending over / movement. She was lost to f/u with cardiology.  Plan: Presumed costochondritis, Voltaren gel sample given Referral to cards to reestablish care

## 2022-09-17 NOTE — Assessment & Plan Note (Signed)
1+ edema to mid shin. 15 lb weight gain in last 7 months. Patient on lasix 20mg  currently, LE edema noted on prior notes including cardiology note from December 2023. No crackles, no JVD. 02 sat 99% on room air today. Less concern for CHF. LE edema has increased in the last week, but may be due to decreased mobility in setting of migraine   Plan: Torsemide 20mg  daily  Revaluate 2 weeks

## 2022-09-17 NOTE — Assessment & Plan Note (Addendum)
A1c 5.7 on 08/16 w/o f/u. Repeat A1c from last visit result today 6.4, patient prediabetes. I will attempt to call the patient with this result.

## 2022-09-17 NOTE — Assessment & Plan Note (Addendum)
Patient presenting w/ headache. History of headache for many years which last weeks to days. Photophobia, phonophobia, nausea. Better with tylenol. Pain all over the head, worst at the top of the head. 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 Instructed to keep log of HA to help categorize and guide future treatment

## 2022-09-17 NOTE — Assessment & Plan Note (Deleted)
Patient has history of gastric ulcers, EGD 2017 showed h.pylori infection, chronic gastritis, no dysplasia or cancer, but metaplasia present. Patient says they never completed a medication regiment was a result of this testing and it appears they were lost to f/u. Of note the patient speaks sudanese dialect of Arabic, which is slightly different from conventional arabic. We have permission to contact her daughters for her medical needs. This may have contributed to lapses in care.   Pt endorses significant epigastric pain, GERD symptoms. Nausea w/o vomiting, no hematemesis, no weight loss, but endorses dark stools occasionally. Some concern for UGI bleeding.  Plan:  H.pylori stool test today  Treatment pending results CBC

## 2022-09-18 LAB — CBC
Hematocrit: 35.8 % (ref 34.0–46.6)
Hemoglobin: 11.6 g/dL (ref 11.1–15.9)
RBC: 4.24 x10E6/uL (ref 3.77–5.28)
WBC: 8 10*3/uL (ref 3.4–10.8)

## 2022-09-18 LAB — HEMOGLOBIN A1C
Est. average glucose Bld gHb Est-mCnc: 137 mg/dL
Hgb A1c MFr Bld: 6.4 % — ABNORMAL HIGH (ref 4.8–5.6)

## 2022-09-18 LAB — BMP8+ANION GAP
CO2: 19 mmol/L — ABNORMAL LOW (ref 20–29)
Calcium: 9.3 mg/dL (ref 8.7–10.2)
Creatinine, Ser: 0.61 mg/dL (ref 0.57–1.00)
Glucose: 100 mg/dL — ABNORMAL HIGH (ref 70–99)
Sodium: 142 mmol/L (ref 134–144)
eGFR: 104 mL/min/{1.73_m2} (ref >59)

## 2022-09-20 ENCOUNTER — Other Ambulatory Visit: Payer: Medicaid Other

## 2022-09-20 ENCOUNTER — Other Ambulatory Visit (HOSPITAL_BASED_OUTPATIENT_CLINIC_OR_DEPARTMENT_OTHER): Payer: Self-pay

## 2022-09-20 DIAGNOSIS — K279 Peptic ulcer, site unspecified, unspecified as acute or chronic, without hemorrhage or perforation: Secondary | ICD-10-CM

## 2022-09-21 ENCOUNTER — Telehealth: Payer: Self-pay | Admitting: Student

## 2022-09-21 NOTE — Telephone Encounter (Signed)
Attempted to contact patient regarding results. Left voice mail.

## 2022-09-22 ENCOUNTER — Telehealth: Payer: Self-pay | Admitting: Student

## 2022-09-22 LAB — H. PYLORI ANTIGEN, STOOL: H pylori Ag, Stl: POSITIVE — AB

## 2022-09-22 NOTE — Progress Notes (Signed)
Internal Medicine Clinic Attending  I was physically present during the key portions of the resident provided service and participated in the medical decision making of patient's management care. I reviewed pertinent patient test results.  The assessment, diagnosis, and plan were formulated together and I agree with the documentation in the resident's note. Detailed history obtained with assistance of interpreter and son.  Of note she did not take the prescribed therapy for H Pylori on either of the previous times that she was supposed to be treated (2017 or 2019) thus we suspected her GERD symptoms are still due to H Pylori gastritis.  With assistance of her son I am hopeful we may be able to complete treatment. Gust Rung, DO

## 2022-09-22 NOTE — Telephone Encounter (Signed)
I spoke with Abigail Butler;s daughter on the phone. Patient's identity was confirmed using two patient specific identifiers. We discussed the patient's postive H.pylori result. Patient will need a follow up appointment for her leg swelling, medication management, and H. Pylori management. I have asked the front desk to schedule this patient.

## 2022-09-23 ENCOUNTER — Other Ambulatory Visit: Payer: Self-pay | Admitting: Internal Medicine

## 2022-09-23 DIAGNOSIS — Z0000 Encounter for general adult medical examination without abnormal findings: Secondary | ICD-10-CM

## 2022-10-12 ENCOUNTER — Encounter: Payer: Medicaid Other | Admitting: Internal Medicine

## 2022-10-25 ENCOUNTER — Ambulatory Visit (INDEPENDENT_AMBULATORY_CARE_PROVIDER_SITE_OTHER): Payer: Medicaid Other | Admitting: Internal Medicine

## 2022-10-25 ENCOUNTER — Other Ambulatory Visit (HOSPITAL_BASED_OUTPATIENT_CLINIC_OR_DEPARTMENT_OTHER): Payer: Self-pay

## 2022-10-25 ENCOUNTER — Telehealth: Payer: Self-pay

## 2022-10-25 VITALS — BP 141/60 | HR 65 | Temp 98.1°F | Ht 64.0 in | Wt 199.6 lb

## 2022-10-25 DIAGNOSIS — I480 Paroxysmal atrial fibrillation: Secondary | ICD-10-CM

## 2022-10-25 DIAGNOSIS — R300 Dysuria: Secondary | ICD-10-CM

## 2022-10-25 DIAGNOSIS — R600 Localized edema: Secondary | ICD-10-CM | POA: Diagnosis not present

## 2022-10-25 DIAGNOSIS — N12 Tubulo-interstitial nephritis, not specified as acute or chronic: Secondary | ICD-10-CM

## 2022-10-25 DIAGNOSIS — K2100 Gastro-esophageal reflux disease with esophagitis, without bleeding: Secondary | ICD-10-CM

## 2022-10-25 DIAGNOSIS — A048 Other specified bacterial intestinal infections: Secondary | ICD-10-CM

## 2022-10-25 DIAGNOSIS — Z0000 Encounter for general adult medical examination without abnormal findings: Secondary | ICD-10-CM

## 2022-10-25 LAB — POCT URINALYSIS DIPSTICK
Bilirubin, UA: NEGATIVE
Glucose, UA: NEGATIVE
Ketones, UA: NEGATIVE
Nitrite, UA: NEGATIVE
Protein, UA: NEGATIVE
Spec Grav, UA: 1.02 (ref 1.010–1.025)
Urobilinogen, UA: 0.2 E.U./dL
pH, UA: 5.5 (ref 5.0–8.0)

## 2022-10-25 MED ORDER — PANTOPRAZOLE SODIUM 40 MG PO TBEC
40.0000 mg | DELAYED_RELEASE_TABLET | Freq: Two times a day (BID) | ORAL | 0 refills | Status: DC
Start: 2022-10-25 — End: 2022-10-25

## 2022-10-25 MED ORDER — BIS SUBCIT-METRONID-TETRACYC 140-125-125 MG PO CAPS
3.0000 | ORAL_CAPSULE | Freq: Three times a day (TID) | ORAL | 0 refills | Status: DC
Start: 2022-10-25 — End: 2022-10-25

## 2022-10-25 MED ORDER — BIS SUBCIT-METRONID-TETRACYC 140-125-125 MG PO CAPS
3.0000 | ORAL_CAPSULE | Freq: Three times a day (TID) | ORAL | 0 refills | Status: AC
Start: 2022-10-25 — End: 2022-11-08
  Filled 2022-10-25: qty 168, 14d supply, fill #0

## 2022-10-25 MED ORDER — CIPROFLOXACIN HCL 500 MG PO TABS
500.0000 mg | ORAL_TABLET | Freq: Two times a day (BID) | ORAL | 0 refills | Status: DC
Start: 2022-10-25 — End: 2022-11-19
  Filled 2022-10-25: qty 14, 7d supply, fill #0

## 2022-10-25 MED ORDER — PANTOPRAZOLE SODIUM 40 MG PO TBEC
40.0000 mg | DELAYED_RELEASE_TABLET | Freq: Two times a day (BID) | ORAL | 0 refills | Status: DC
Start: 2022-10-25 — End: 2022-11-11
  Filled 2022-10-25: qty 28, 14d supply, fill #0

## 2022-10-25 MED ORDER — CIPROFLOXACIN HCL 500 MG PO TABS
500.0000 mg | ORAL_TABLET | Freq: Two times a day (BID) | ORAL | 0 refills | Status: DC
Start: 2022-10-25 — End: 2022-10-25
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
## 2022-10-25 NOTE — Telephone Encounter (Signed)
Received a call from [facility] pharmacy - stated pt's insurance will not cover bismuth-metronidazole-tetracycline. Combo pill. But re-send 3 separate meds in which they will be covered. Thanks

## 2022-10-25 NOTE — Patient Instructions (Addendum)
[name], it was a pleasure seeing you today! You endorsed feeling well today. Below are some of the things we talked about this visit. We look forward to seeing you in the follow up appointment!  Today we discussed: We are treating your stomach infection with antibiotics. Please take them 4 times daily as it says on the bottle.  We are also treating your urinary tract infection with ciprofloxacin which is an antibiotic.  We will check blood work today.  Please do not take your torsemide until I call you back.  I am referring you to heart and stomach doctor.  If you have fevers or chills or your symptoms are worsening, please go to the ED.  I have ordered the following labs today:  Lab Orders         Urine Culture         POCT Urinalysis Dipstick (40981)       Referrals ordered today:   Referral Orders         Ambulatory referral to Cardiology         Ambulatory referral to Gastroenterology      I have ordered the following medication/changed the following medications:   Stop the following medications: Medications Discontinued During This Encounter  Medication Reason   metoCLOPramide (REGLAN) 5 MG tablet Duplicate     Start the following medications: Meds ordered this encounter  Medications   pantoprazole (PROTONIX) 40 MG tablet    Sig: Take 1 tablet (40 mg total) by mouth 2 (two) times daily for 14 days.    Dispense:  28 tablet    Refill:  0   bismuth-metronidazole-tetracycline (PYLERA) 140-125-125 MG capsule    Sig: Take 3 capsules by mouth 4 (four) times daily -  before meals and at bedtime for 14 days.    Dispense:  168 capsule    Refill:  0   ciprofloxacin (CIPRO) 500 MG tablet    Sig: Take 1 tablet (500 mg total) by mouth 2 (two) times daily for 7 days.    Dispense:  14 tablet    Refill:  0     Follow-up: 4 week follow up   Please make sure to arrive 15 minutes prior to your next appointment. If you arrive late, you may be asked to reschedule.   We look  forward to seeing you next time. Please call our clinic at [phone] if you have any questions or concerns. The best time to call is Monday-Friday from 9am-4pm, but there is someone available 24/7. If after hours or the weekend, call the main hospital number and ask for the Internal Medicine Resident On-Call. If you need medication refills, please notify your pharmacy one week in advance and they will send [place] a request.  Thank you for letting us take part in your care. Wishing you the best!  Thank you, Abigail Abbot, MD   ?????? ???? ??????? ??? ??? ?? ????? ?????? ????? ?????! ??? ???? ?? ??? ?????? ????? ?????. ???? ??? ??? ?????? ???? ?????? ???? ?? ??? ???????. ????? ??? ????? ?? ???? ????????! ??? ?????? ?????: ??? ????? ???? ?????? ???? ????????? ???????. ???? ??????? 4 ???? ?????? ??? ?? ????? ??? ???????. ??? ????? ????? ???? ??????? ??????? ???? ????????????????? ??? ???? ????. ????? ???? ???? ?????. ???? ??? ????? ???? ???????? ????? ?? ??? ???? ?? ??? ????. ??? ???? ????? ???????? ???????: ????? ???????? ????? ????? ???? ????? ????? ?? ???? ??????? (81002) ???????? ???????? ?????: ????? ??????? ????? ?????? ??? ??? ????? ????? ????? ?????? ??? ??? ????? ?????? ?????? ??? ???? ??????? ???????/???? ??????? ???????: ???? ?? ????? ??????? ???????: ??????? ???? ?? ????? ??????? ????? ??? ???????? ??? ????? ??????  ??? ????????????? (??????)  5 ??? ???? ???? ????? ??????? ???????: ??????? ???????? ???? ?????? ???????  ??? ??????????? (?????????) 40 ??? ??????: ????? ??? ???? (40 ??? ???????) ?? ???? ???? ????? (?????) ?????? ???? 14 ?????. ???????: 28 ????? ????? ???????: 0  ?????? ?????-????????????-??????????? (??????) 140-125-125 ??? ??????: ????? 3 ??????? ?? ???? ???? 4 (????) ???? ?????? - ??? ??????? ???? ????? ???? 14 ?????. ???????: 168 ?????? ????? ???????: 0  ??? ?????????????? (?????) 500 ??? ??????: ????? ??? ???? (500 ??? ???????) ?? ???? ???? ????? (?????) ?????? ???? 7 ????. ???????: 14  ????? ????? ???????: 0 ????????: ?????? ??? 4 ?????? ???? ?????? ?? ?????? ??? ????? ?????? ?? 15 ?????. ??? ???? ???????? ??? ????? ??? ????? ???????. ????? ??? ????? ?? ????? ???????. ???? ??????? ???????? ??? ????? [phone] ??? ??? ???? ?? ????? ?? ?????. ???? ??? ??????? ?? ?? ??????? ??? ?????? ?? ?????? 9 ?????? ??? 4 ?????? ???? ???? ??? ???? ??? ???? ?????? ???? ???? ???????. ??? ??? ???? ????? ????? ?? ?? ???? ????? ???????? ????? ???? ???????? ??????? ????? ???? ??????? ???????? ?????? ???????. ??? ??? ????? ??? ????? ????? ???????? ????? ????? ???????? ?????? ?? ??? ????? ???? ???? ?????? ??? ?????. ????? ??? ?????? ??? ????????? ?? ??????. ????? ?? ??????! ????? ??? ???? ???? ????? ?? ????  

## 2022-10-26 ENCOUNTER — Encounter: Payer: Self-pay | Admitting: Internal Medicine

## 2022-10-26 ENCOUNTER — Other Ambulatory Visit (HOSPITAL_BASED_OUTPATIENT_CLINIC_OR_DEPARTMENT_OTHER): Payer: Self-pay

## 2022-10-26 DIAGNOSIS — R300 Dysuria: Secondary | ICD-10-CM

## 2022-10-26 HISTORY — DX: Dysuria: R30.0

## 2022-10-26 LAB — CBC WITH DIFFERENTIAL/PLATELET
Basophils Absolute: 0.1 10*3/uL (ref 0.0–0.2)
Basos: 1 %
EOS (ABSOLUTE): 0.2 10*3/uL (ref 0.0–0.4)
Eos: 3 %
Hematocrit: 35 % (ref 34.0–46.6)
Hemoglobin: 11.3 g/dL (ref 11.1–15.9)
Immature Grans (Abs): 0.1 10*3/uL (ref 0.0–0.1)
Immature Granulocytes: 1 %
Lymphocytes Absolute: 2.1 10*3/uL (ref 0.7–3.1)
Lymphs: 27 %
MCH: 27.1 pg (ref 26.6–33.0)
MCHC: 32.3 g/dL (ref 31.5–35.7)
MCV: 84 fL (ref 79–97)
Monocytes Absolute: 0.8 10*3/uL (ref 0.1–0.9)
Monocytes: 10 %
Neutrophils Absolute: 4.5 10*3/uL (ref 1.4–7.0)
Neutrophils: 58 %
Platelets: 225 10*3/uL (ref 150–450)
RBC: 4.17 x10E6/uL (ref 3.77–5.28)
RDW: 12.9 % (ref 11.7–15.4)
WBC: 7.8 10*3/uL (ref 3.4–10.8)

## 2022-10-26 LAB — BMP8+ANION GAP
Anion Gap: 17 mmol/L (ref 10.0–18.0)
BUN/Creatinine Ratio: 22 (ref 9–23)
BUN: 12 mg/dL (ref 6–24)
CO2: 19 mmol/L — ABNORMAL LOW (ref 20–29)
Calcium: 9 mg/dL (ref 8.7–10.2)
Chloride: 107 mmol/L — ABNORMAL HIGH (ref 96–106)
Creatinine, Ser: 0.55 mg/dL — ABNORMAL LOW (ref 0.57–1.00)
Glucose: 129 mg/dL — ABNORMAL HIGH (ref 70–99)
Potassium: 4.3 mmol/L (ref 3.5–5.2)
Sodium: 143 mmol/L (ref 134–144)
eGFR: 106 mL/min/{1.73_m2} (ref >59)

## 2022-10-26 MED ORDER — TETRACYCLINE HCL 500 MG PO CAPS
500.0000 mg | ORAL_CAPSULE | Freq: Four times a day (QID) | ORAL | 0 refills | Status: DC
Start: 2022-10-26 — End: 2022-10-29
  Filled 2022-10-26: qty 56, 14d supply, fill #0

## 2022-10-26 MED ORDER — BISMUTH 262 MG PO CHEW
2.0000 | CHEWABLE_TABLET | Freq: Four times a day (QID) | ORAL | 0 refills | Status: DC
Start: 2022-10-26 — End: 2022-11-19
  Filled 2022-10-26: qty 120, 14d supply, fill #0

## 2022-10-26 MED ORDER — METRONIDAZOLE 500 MG PO TABS
500.0000 mg | ORAL_TABLET | Freq: Four times a day (QID) | ORAL | 0 refills | Status: DC
Start: 2022-10-26 — End: 2022-11-19
  Filled 2022-10-26: qty 56, 14d supply, fill #0

## 2022-10-26 NOTE — Assessment & Plan Note (Addendum)
Pt has paroxsymal atrial fibrillation. She was previously on warfarin for thrombus of left atrial appendage. Last dispensed in 12/2021. Chad-VASc score is 4 and pt has replaced mitral heart valve. She needs AC given high chad-VASc score and warfarin given her likely valvular atrial fibrillation. Discussed restarting warfarin with the patient. She has follow up with cardiology in 2 weeks and will discuss it with them.

## 2022-10-26 NOTE — Assessment & Plan Note (Signed)
Will treat H. . Pylori infection and repeat endoscopy.

## 2022-10-26 NOTE — Assessment & Plan Note (Signed)
Started quadruple therapy for H. Pylori. Provided extended education regarding the consequence of untreated infection including malignancy. Pt is understanding. Will refer to GI to plan for repeat endoscopy as pt had prolonged infection with H. Pylori and previous endoscopy in 2017 showed gastric inflammation. Started on PPI BID, Bismuth 420 QID, Tetracycline 500 mg QID, and Metronidazole QID for 14 days. Will follow up in one month to repeat testing to check for eradication.

## 2022-10-26 NOTE — Assessment & Plan Note (Signed)
Will discuss with the pt once pt finishes treatment for H. Pylori.

## 2022-10-26 NOTE — Assessment & Plan Note (Signed)
Pt with 2 weeks of dysuria. UA shows leukocytes and hematuria. Given symptoms and UA, along with left CVA tenderness plan to treat pt for pyelonephritis. She reports intermittent fevers relieved by tylenol. CBC obtained does not show leukocytosis. Urine culture is pending and pt started on empiric therapy with ciprofloxacin for 7 days. Will follow up Urine Culture and adjust abx as needed.

## 2022-10-26 NOTE — Assessment & Plan Note (Signed)
Pt was taking both furosemide and torsemide. Advised to take only torsemide as instructed. Discarded the furosemide prescription.

## 2022-10-27 ENCOUNTER — Other Ambulatory Visit (HOSPITAL_BASED_OUTPATIENT_CLINIC_OR_DEPARTMENT_OTHER): Payer: Self-pay

## 2022-10-27 ENCOUNTER — Telehealth: Payer: Self-pay

## 2022-10-27 LAB — URINE CULTURE

## 2022-10-27 NOTE — Telephone Encounter (Signed)
Decision:Denied YOU ASKED FOR: Service Description Code 1 Code 2 Plan Requested  Dates Requested  Amount TETRACYCLINE HCL Capsule [id number] Medicaid 56 units WE DENIED: Service Description Code 1 Code 2 Plan Denied Dates Denied  Amount TETRACYCLINE HCL Capsule [id number] Medicaid 56 units COMMENTS:  Per the health plan's preferred drug list, at least 2 preferred drugs must be tried before requesting this drug  or tell us why the member cannot try any preferred alternatives. Please send [place] supporting chart notes and  lab results. Here is list of preferred alternatives: doxycycline hyclate capsule / tablet (generic for Vibramycin,  Vibra-Tab), doxycycline monohydrate 50mg , 100mg  capsule (generic for Monodox) , minocycline 50mg ,  75mg , 100mg  capsule (generic for Minocin).

## 2022-10-27 NOTE — Telephone Encounter (Signed)
Prior Authorization for patient (Tetracycline) came through on cover my meds was submitted with last office notes awaiting approval or denial.  WUJ:[id number]

## 2022-10-28 ENCOUNTER — Other Ambulatory Visit (HOSPITAL_BASED_OUTPATIENT_CLINIC_OR_DEPARTMENT_OTHER): Payer: Self-pay

## 2022-10-28 ENCOUNTER — Encounter: Payer: Medicaid Other | Admitting: Student

## 2022-10-28 NOTE — Addendum Note (Signed)
Addended by: Burnell Blanks on: 10/28/2022 08:54 AM   Modules accepted: Level of Service

## 2022-10-28 NOTE — Progress Notes (Signed)
Internal Medicine Clinic Attending  Case discussed with the resident at the time of the visit.  We reviewed the resident's history and exam and pertinent patient test results.  I agree with the assessment, diagnosis, and plan of care documented in the resident's note.  

## 2022-10-29 ENCOUNTER — Other Ambulatory Visit (HOSPITAL_BASED_OUTPATIENT_CLINIC_OR_DEPARTMENT_OTHER): Payer: Self-pay

## 2022-10-29 ENCOUNTER — Other Ambulatory Visit: Payer: Self-pay | Admitting: Internal Medicine

## 2022-10-29 DIAGNOSIS — A048 Other specified bacterial intestinal infections: Secondary | ICD-10-CM

## 2022-10-29 MED ORDER — TETRACYCLINE HCL 500 MG PO CAPS
500.0000 mg | ORAL_CAPSULE | Freq: Four times a day (QID) | ORAL | 0 refills | Status: AC
Start: 2022-10-29 — End: 2022-11-12

## 2022-11-04 NOTE — Telephone Encounter (Signed)
Prior Authorization was approved effective 10/27/2022-11/18/2022. Approved amount:56 Capsules

## 2022-11-04 NOTE — Telephone Encounter (Signed)
Appeal has been completed and faxed back to [employer] [phone]) with the last office notes awaiting approval or denial.

## 2022-11-09 ENCOUNTER — Telehealth: Payer: Self-pay | Admitting: *Deleted

## 2022-11-09 NOTE — Telephone Encounter (Signed)
Patients daughter [name] called and said that patient since starting new meds patient is having dark stools.  Nausea  and itching.  Patient has been started on Bismuth, Flagyl and Tetracycline  as well.   Patient stopped taking the new medication. Feels better today but is still having the black stools. Wants to know what this can be changed to.

## 2022-11-11 ENCOUNTER — Ambulatory Visit: Payer: Medicaid Other | Attending: Cardiology | Admitting: Cardiology

## 2022-11-11 ENCOUNTER — Encounter: Payer: Self-pay | Admitting: Cardiology

## 2022-11-11 VITALS — BP 148/60 | HR 60 | Ht 65.0 in | Wt 196.0 lb

## 2022-11-11 DIAGNOSIS — E782 Mixed hyperlipidemia: Secondary | ICD-10-CM | POA: Diagnosis not present

## 2022-11-11 DIAGNOSIS — I10 Essential (primary) hypertension: Secondary | ICD-10-CM | POA: Diagnosis not present

## 2022-11-11 DIAGNOSIS — Z8673 Personal history of transient ischemic attack (TIA), and cerebral infarction without residual deficits: Secondary | ICD-10-CM

## 2022-11-11 DIAGNOSIS — Z8679 Personal history of other diseases of the circulatory system: Secondary | ICD-10-CM

## 2022-11-11 DIAGNOSIS — Z952 Presence of prosthetic heart valve: Secondary | ICD-10-CM

## 2022-11-11 DIAGNOSIS — I480 Paroxysmal atrial fibrillation: Secondary | ICD-10-CM | POA: Diagnosis not present

## 2022-11-11 DIAGNOSIS — Z9889 Other specified postprocedural states: Secondary | ICD-10-CM

## 2022-11-11 NOTE — Patient Instructions (Addendum)
Parentah pikeun nginum obat:  Dokter anjeun nyarankeun yn anjeun neraskeun pangobatan anjeun ayeuna sakumaha anu diarahkeun. Punten tingal daptar Pangobatan Ayeuna anu dipasihkeun ka anjeun ayeuna.  *Upami anjeun peryogi ngeusian deui pangobatan jantung sateuacan janjian salajengna, mangga nelepon apotk anjeun*   [facility]: Euweuh nu marntahkeun Upami anjeun gaduh lab (padamelan getih) Corinda Gubler sareng ts anjeun leres-leres normal, anjeun bakal nampi hasil ngan ku:  MyChart Pesen (lamun boga MyChart) OR  Salinan kertas dina surat Upami anjeun ngagaduhan ts laboratorium anu teu normal atanapi urang kedah ngarobih perlakuan anjeun, kami bakal nelepon anjeun pikeun marios hasil.   Ts / Prosedur: Dokter anjeun parantos nyuhunkeun anjeun gaduh echocardiogram. Echocardiography mangrupikeun ts henteu aya rasa nyeri anu ngagunakeun gelombang sora pikeun nyiptakeun gambar jantung anjeun. ta nyayogikeun dokter anjeun inpormasi ngeunaan ukuran sareng bentuk jantung anjeun sareng kumaha kamar sareng klep jantung anjeun berpungsi. Prosedur ieu nyokot ngeunaan hiji jam. Henteu aya larangan pikeun prosedur ieu. Punten Entong nganggo cologne, parfum, aftershave, atanapi lotion (deodoran diidinan). Mangga sumping 15 menit sateuacan waktos janjian anjeun.  Tindak lanjut: [facility], anjeun sareng kabutuhan kashatan anjeun mangrupikeun prioritas kami.  Salaku bagian tina misi neraskeun kami pikeun nyayogikeun anjeun perawatan jantung anu luar biasa, kami parantos nyiptakeun Tim Per[place]atan [place] anu ditunjuk.  Tim Perawatan ieu kalebet Cardiologist primr anjeun (dokter) sareng [place] Jerrol Banana (APP - Asisten Dokter sareng Animal nutritionist) anu sadayana damel babarengan pikeun nyayogikeun perawatan anu anjeun peryogikeun, nalika anjeun peryogina.  Kami nyarankeun ngadaptar pikeun portal pasien anu disebut "MyChart".  Inpormasi pendaptaran disayogikeun dina Ringkesan Saatos Nganjang ieu.   MyChart dianggo pikeun ngahubung sareng pasien pikeun Public house manager (Telemedicine).  Pasn tiasa ningali hasil lab / ts, catetan patepungan, janjian anu bakal datang, jsb. Pesen non-urgent og tiasa dikirim ka panyadia anjeun.   Pikeun leuwih jntr ngeunaan naon anu anjeun tiasa laksanakeun sareng MyChart, buka [email or web address].    Janjian anjeun salajengna:   9 bulan (s)  Format pikeun janjian salajengna anjeun:   Dina Jalma  [place]:   Belva Crome, MD  Echocardiogram Echocardiogram nyata ts anu ngagunakeun gelombang sora (ultrasound) pikeun ngahasilkeun gambar jantung. Gambar tina echocardiogram tiasa masihan inpormasi penting ngeunaan: ? Ukuran jeung bentuk hat. ? Dayna Barker jeung ketebalan sarta gerakan tmbok hat anjeun. ? Fungsi jeung kakuatan otot jantung. ? Fungsi klep jantung atanapi upami anjeun ngagaduhan stnosis. Stnosis nyata nalika klep jantung sempit teuing. ? Lamun getih ngalir ka tukang ngaliwatan klep jantung (regurgitation). ? Tumor atawa tumuwuhna infksi sabudeureun valves jantung. ? Wewengkon otot [employer] anu henteu jalan sa kusabab aliran getih gorng atanapi tatu tina serangan jantung. ? deteksi aneurysm. Aneurysm mangrupikeun bagian anu lemah atanapi ruksak tina tmbok arteri. Tembok bulges kaluar tina gaya normal getih ngompa ngaliwatan awak.   Medication Instructions:  Your physician recommends that you continue on your current medications as directed. Please refer to the Current Medication list given to you today.  *If you need a refill on your cardiac medications before your next appointment, please call your pharmacy*  Lab Work: None ordered If you have labs (blood work) drawn today and your tests are completely normal, you will receive your results only by: MyChart Message (if you have MyChart) OR A paper copy in the mail If you have any lab test that is abnormal or we need to change your treatment, we will call you to review the  results.   Testing/Procedures: Your physician has requested that you have an echocardiogram. Echocardiography is a painless test that uses sound waves to create images of your heart. It provides your  doctor with information about the size and shape of your heart and how well your heart's chambers and valves are working. This procedure takes approximately one hour. There are no restrictions for this procedure. Please do NOT wear cologne, perfume, aftershave, or lotions (deodorant is allowed). Please arrive 15 minutes prior to your appointment time.   Follow-Up: At [facility], you and your health needs are our priority.  As part of our continuing mission to provide you with exceptional heart care, we have created designated Provider Care Teams.  These Care Teams include your primary Cardiologist (physician) and Advanced Practice Providers (APPs -  Physician Assistants and Nurse Practitioners) who all work together to provide you with the care you need, when you need it.  We recommend signing up for the patient portal called "MyChart".  Sign up information is provided on this After Visit Summary.  MyChart is used to connect with patients for Virtual Visits (Telemedicine).  Patients are able to view lab/test results, encounter notes, upcoming appointments, etc.  Non-urgent messages can be sent to your provider as well.   To learn more about what you can do with MyChart, go to [email or web address].    Your next appointment:   9 month(s)  The format for your next appointment:   In Person  Provider:   Belva Crome, MD   Other Instructions Echocardiogram An echocardiogram is a test that uses sound waves (ultrasound) to produce images of the heart. Images from an echocardiogram can provide important information about: Heart size and shape. The size and thickness and movement of your heart's walls. Heart muscle function and strength. Heart valve function or if you have stenosis.  Stenosis is when the heart valves are too narrow. If blood is flowing backward through the heart valves (regurgitation). A tumor or infectious growth around the heart valves. Areas of heart muscle that are not working well because of poor blood flow or injury from a heart attack. Aneurysm detection. An aneurysm is a weak or damaged part of an artery wall. The wall bulges out from the normal force of blood pumping through the body. Tell a health care provider about: Any allergies you have. All medicines you are taking, including vitamins, herbs, eye drops, creams, and over-the-counter medicines. Any blood disorders you have. Any surgeries you have had. Any medical conditions you have. Whether you are pregnant or may be pregnant. What are the risks? Generally, this is a safe test. However, problems may occur, including an allergic reaction to dye (contrast) that may be used during the test. What happens before the test? No specific preparation is needed. You may eat and drink normally. What happens during the test? You will take off your clothes from the waist up and put on a hospital gown. Electrodes or electrocardiogram (ECG)patches may be placed on your chest. The electrodes or patches are then connected to a device that monitors your heart rate and rhythm. You will lie down on a table for an ultrasound exam. A gel will be applied to your chest to help sound waves pass through your skin. A handheld device, called a transducer, will be pressed against your chest and moved over your heart. The transducer produces sound waves that travel to your heart and bounce back (or "echo" back) to the transducer. These sound waves will be captured in real-time and changed into images of your heart that can be viewed on a video monitor. The images will be recorded on a computer and reviewed by your health  care provider. You may be asked to change positions or hold your breath for a short time. This makes it  easier to get different views or better views of your heart. In some cases, you may receive contrast through an IV in one of your veins. This can improve the quality of the pictures from your heart. The procedure may vary among health care providers and hospitals.   What can I expect after the test? You may return to your normal, everyday life, including diet, activities, and medicines, unless your health care provider tells you not to do that. Follow these instructions at home: It is up to you to get the results of your test. Ask your health care provider, or the department that is doing the test, when your results will be ready. Keep all follow-up visits. This is important. Summary An echocardiogram is a test that uses sound waves (ultrasound) to produce images of the heart. Images from an echocardiogram can provide important information about the size and shape of your heart, heart muscle function, heart valve function, and other possible heart problems. You do not need to do anything to prepare before this test. You may eat and drink normally. After the echocardiogram is completed, you may return to your normal, everyday life, unless your health care provider tells you not to do that. This information is not intended to replace advice given to you by your health care provider. Make sure you discuss any questions you have with your health care provider. Document Revised: 10/09/2019 Document Reviewed: 10/09/2019 Elsevier Patient Education  2021 Elsevier Inc.   Important Information About Sugar

## 2022-11-11 NOTE — Progress Notes (Signed)
Cardiology Office Note:    Date:  11/11/2022   ID:  Abigail Butler, DOB [birth year], MRN [id number]  PCP:  Katheran James, DO  Cardiologist:  Garwin Brothers, MD   Referring MD: Katheran James, DO    ASSESSMENT:    1. Primary hypertension   2. PAF (paroxysmal atrial fibrillation) (HCC)   3. H/O: CVA (cerebrovascular accident)   4. Mixed dyslipidemia   5. S/P Maze operation for atrial fibrillation   6. S/P MVR (mitral valve replacement)    PLAN:    In order of problems listed above:  Primary prevention stressed with the patient.  Importance of compliance with diet medication stressed and patient verbalized standing. She tells me that she walks more than half an hour a day on a regular basis without any symptoms.  I congratulated her and told her to continue this. Post mitral valve replacement: Stable we will do an echocardiogram to follow-up on this. Coronary angiography in 2019 revealed normal coronaries.  I discussed this with her. Essential hypertension: Blood pressure stable and diet was emphasized.  Lifestyle modification urged. Mixed dyslipidemia: On lipid-lowering medications followed by primary care.  Diet and exercise and weight reduction stressed.  Risks of obesity explained. Patient will be seen in follow-up appointment in 9 months or earlier if the patient has any concerns.    Medication Adjustments/Labs and Tests Ordered: Current medicines are reviewed at length with the patient today.  Concerns regarding medicines are outlined above.  No orders of the defined types were placed in this encounter.  No orders of the defined types were placed in this encounter.    No chief complaint on file.    History of Present Illness:    Abigail Butler is a 57 y.o. female.  Patient has past medical history of essential hypertension, paroxysmal atrial fibrillation, mitral valve replacement, maze operation for atrial fibrillation and left atrial d appendage clipping.   She has had a history of stroke.  She denies any problems at this time and takes care of activities of daily living.  No chest pain orthopnea or PND.  At the time of my evaluation, the patient is alert awake oriented and in no distress.  Past Medical History:  Diagnosis Date   Cervical cancer screening 06/04/2021   Chronic left shoulder pain 03/23/2022   Daily headache    "sometimes; often" (02/10/2012)   Dysuria 10/26/2022   GERD (gastroesophageal reflux disease)    H. pylori infection    ?failed standard tx; currently undergoing levofloxacin salvage tx   H/O: CVA (cerebrovascular accident) 06/01/2013   Right thalamic CVA 2015   Health care maintenance 12/24/2013   Health care maintenance 12/24/2013   Hypertension 05/11/2017   Long term (current) use of anticoagulants 06/22/2021   Lower extremity edema 09/16/2022   Lumbar disc disease with radiculopathy 10/15/2014   Mass of left breast on mammogram 05/06/2015   Migraine without status migrainosus, not intractable 09/16/2022   Mixed dyslipidemia 11/12/2021   PAF (paroxysmal atrial fibrillation) (HCC) 11/12/2021   Prediabetes 10/15/2014   A1c 5.7 on 10/15/14   Right arm pain 10/15/2016   S/P Maze operation for atrial fibrillation 07/17/2021   S/P MVR (mitral valve replacement) 07/17/2021   TB lung, latent 2009   Treated in 2009 by [facility]   Thrombus of left atrial appendage 06/04/2021    Past Surgical History:  Procedure Laterality Date   DILATION AND CURETTAGE OF UTERUS  ~ 2008   "cleaned out infection" (02/10/2012)  LEFT HEART CATH AND CORONARY ANGIOGRAPHY N/A 04/19/2017   Procedure: LEFT HEART CATH AND CORONARY ANGIOGRAPHY;  Surgeon: Swaziland, Peter M, MD;  Location: [facility] INVASIVE CV LAB;  Service: Cardiovascular;  Laterality: N/A;   MAZE N/A 07/17/2021   Procedure: MAZE;  Surgeon: Alleen Borne, MD;  Location: [facility] OR;  Service: Open Heart Surgery;  Laterality: N/A;   MITRAL VALVE REPLACEMENT N/A 07/17/2021    Procedure: MITRAL VALVE REPLACEMENT USING 27 MM MITRIS VALVE.  CRYO AND RADIOFREQUENCY MAZE PROCEDURE. LIGATION OF LEFT ATRIAL APPENDAGE.;  Surgeon: Alleen Borne, MD;  Location: [facility] OR;  Service: Open Heart Surgery;  Laterality: N/A;   TEE WITHOUT CARDIOVERSION N/A 05/27/2021   Procedure: TRANSESOPHAGEAL ECHOCARDIOGRAM (TEE);  Surgeon: [name], MD;  Location[facility] ENDOSCOPY;  Service: Cardiovascular;  Laterality: N/A;   TEE WITHOUT CARDIOVERSION N/A 07/17/2021   Procedure: TRANSESOPHAGEAL ECHOCARDIOGRAM (TEE);  Surgeon: Alleen Borne, MD;  Location: [facility] OR;  Service: Open Heart Surgery;  Laterality: N/A;    Current Medications: Current Meds  Medication Sig   aspirin 81 MG EC tablet Take 1 tablet (81 mg total) by mouth daily. Swallow whole.   atorvastatin (LIPITOR) 40 MG tablet Take 1 tablet (40 mg total) by mouth daily.   metoprolol tartrate (LOPRESSOR) 25 MG tablet Take 1 tablet (25 mg total) by mouth 2 (two) times daily.     Allergies:   Pork-derived products   Social History   Socioeconomic History   Marital status: Married    Spouse name: Not on file   Number of children: 3   Years of education: Not on file   Highest education level: Not on file  Occupational History   Occupation: Homemaker  Tobacco Use   Smoking status: Never    Passive exposure: Never   Smokeless tobacco: Never  Vaping Use   Vaping status: Never Used  Substance and Sexual Activity   Alcohol use: No    Alcohol/week: 0.0 standard drinks of alcohol   Drug use: No   Sexual activity: Not on file  Other Topics Concern   Not on file  Social History Narrative   ** Merged History Encounter **       ** Merged History Encounter **       Social Determinants of Health   Financial Resource Strain: Not on file  Food Insecurity: Not on file  Transportation Needs: Not on file  Physical Activity: Not on file  Stress: Not on file  Social Connections: Not on file     Family History: The  patient's family history is negative for Colon cancer, Colon polyps, Esophageal cancer, Rectal cancer, and Stomach cancer.  ROS:   Please see the history of present illness.    All other systems reviewed and are negative.  EKGs/Labs/Other Studies Reviewed:    The following studies were reviewed today: I discussed my findings with the patient at length.   Recent Labs: 12/01/2021: ALT 31 10/25/2022: BUN 12; Creatinine, Ser 0.55; Hemoglobin 11.3; Platelets 225; Potassium 4.3; Sodium 143  Recent Lipid Panel    Component Value Date/Time   CHOL 177 06/04/2021 0949   TRIG 173 (H) 06/04/2021 0949   HDL 32 (L) 06/04/2021 0949   CHOLHDL 5.5 (H) 06/04/2021 0949   CHOLHDL 5.3 02/10/2012 0520   VLDL 24 02/10/2012 0520   LDLCALC 114 (H) 06/04/2021 0949    Physical Exam:    VS:  BP (!) 148/60   Pulse 60   Ht 5\' 5"  (1.651 m)  Wt 196 lb 0.6 oz (88.9 kg)   SpO2 96%   BMI 32.62 kg/m     Wt Readings from Last 3 Encounters:  11/11/22 196 lb 0.6 oz (88.9 kg)  10/25/22 199 lb 9.6 oz (90.5 kg)  09/16/22 199 lb 1.6 oz (90.3 kg)     GEN: Patient is in no acute distress HEENT: Normal NECK: No JVD; No carotid bruits LYMPHATICS: No lymphadenopathy CARDIAC: Hear sounds regular, 2/6 systolic murmur at the apex. RESPIRATORY:  Clear to auscultation without rales, wheezing or rhonchi  ABDOMEN: Soft, non-tender, non-distended MUSCULOSKELETAL:  No edema; No deformity  SKIN: Warm and dry NEUROLOGIC:  Alert and oriented x 3 PSYCHIATRIC:  Normal affect   Signed, Garwin Brothers, MD  11/11/2022 9:25 AM    [facility]

## 2022-11-18 ENCOUNTER — Other Ambulatory Visit: Payer: Self-pay | Admitting: Cardiology

## 2022-11-18 DIAGNOSIS — Z8679 Personal history of other diseases of the circulatory system: Secondary | ICD-10-CM

## 2022-11-18 DIAGNOSIS — Z8673 Personal history of transient ischemic attack (TIA), and cerebral infarction without residual deficits: Secondary | ICD-10-CM

## 2022-11-18 DIAGNOSIS — E782 Mixed hyperlipidemia: Secondary | ICD-10-CM

## 2022-11-18 DIAGNOSIS — I480 Paroxysmal atrial fibrillation: Secondary | ICD-10-CM

## 2022-11-18 DIAGNOSIS — I10 Essential (primary) hypertension: Secondary | ICD-10-CM

## 2022-11-18 DIAGNOSIS — Z952 Presence of prosthetic heart valve: Secondary | ICD-10-CM

## 2022-11-19 ENCOUNTER — Other Ambulatory Visit: Payer: Self-pay

## 2022-11-19 ENCOUNTER — Encounter (HOSPITAL_BASED_OUTPATIENT_CLINIC_OR_DEPARTMENT_OTHER): Payer: Self-pay | Admitting: Emergency Medicine

## 2022-11-19 ENCOUNTER — Emergency Department (HOSPITAL_BASED_OUTPATIENT_CLINIC_OR_DEPARTMENT_OTHER): Payer: Medicaid Other

## 2022-11-19 ENCOUNTER — Other Ambulatory Visit: Payer: Self-pay | Admitting: Internal Medicine

## 2022-11-19 ENCOUNTER — Emergency Department (HOSPITAL_BASED_OUTPATIENT_CLINIC_OR_DEPARTMENT_OTHER)
Admission: EM | Admit: 2022-11-19 | Discharge: 2022-11-20 | Disposition: A | Discharge status: Home / Self Care | Payer: Medicaid Other | Attending: Emergency Medicine | Admitting: Emergency Medicine

## 2022-11-19 ENCOUNTER — Other Ambulatory Visit (HOSPITAL_BASED_OUTPATIENT_CLINIC_OR_DEPARTMENT_OTHER): Payer: Self-pay

## 2022-11-19 DIAGNOSIS — N12 Tubulo-interstitial nephritis, not specified as acute or chronic: Secondary | ICD-10-CM

## 2022-11-19 DIAGNOSIS — R079 Chest pain, unspecified: Secondary | ICD-10-CM | POA: Insufficient documentation

## 2022-11-19 DIAGNOSIS — N3000 Acute cystitis without hematuria: Secondary | ICD-10-CM | POA: Insufficient documentation

## 2022-11-19 DIAGNOSIS — Z7982 Long term (current) use of aspirin: Secondary | ICD-10-CM | POA: Insufficient documentation

## 2022-11-19 DIAGNOSIS — A048 Other specified bacterial intestinal infections: Secondary | ICD-10-CM

## 2022-11-19 DIAGNOSIS — Z79899 Other long term (current) drug therapy: Secondary | ICD-10-CM | POA: Diagnosis not present

## 2022-11-19 DIAGNOSIS — I10 Essential (primary) hypertension: Secondary | ICD-10-CM | POA: Insufficient documentation

## 2022-11-19 DIAGNOSIS — R1013 Epigastric pain: Secondary | ICD-10-CM | POA: Insufficient documentation

## 2022-11-19 LAB — URINALYSIS, MICROSCOPIC (REFLEX)

## 2022-11-19 LAB — CBC
HCT: 37.9 % (ref 36.0–46.0)
Hemoglobin: 12 g/dL (ref 12.0–15.0)
MCH: 26.8 pg (ref 26.0–34.0)
MCHC: 31.7 g/dL (ref 30.0–36.0)
MCV: 84.6 fL (ref 80.0–100.0)
Platelets: 262 10*3/uL (ref 150–400)
RBC: 4.48 MIL/uL (ref 3.87–5.11)
RDW: 13.7 % (ref 11.5–15.5)
WBC: 8.8 10*3/uL (ref 4.0–10.5)
nRBC: 0 % (ref 0.0–0.2)

## 2022-11-19 LAB — URINALYSIS, ROUTINE W REFLEX MICROSCOPIC
Glucose, UA: NEGATIVE mg/dL
Ketones, ur: NEGATIVE mg/dL
Nitrite: POSITIVE — AB
Protein, ur: 30 mg/dL — AB
Specific Gravity, Urine: >=1.03 (ref 1.005–1.030)
pH: 6 (ref 5.0–8.0)

## 2022-11-19 LAB — BASIC METABOLIC PANEL
Anion gap: 16 — ABNORMAL HIGH (ref 5–15)
BUN: 9 mg/dL (ref 6–20)
CO2: 20 mmol/L — ABNORMAL LOW (ref 22–32)
Calcium: 8.9 mg/dL (ref 8.9–10.3)
Chloride: 103 mmol/L (ref 98–111)
Creatinine, Ser: 0.6 mg/dL (ref 0.44–1.00)
GFR, Estimated: >60 mL/min (ref >60)
Glucose, Bld: 98 mg/dL (ref 70–99)
Potassium: 3.8 mmol/L (ref 3.5–5.1)
Sodium: 139 mmol/L (ref 135–145)

## 2022-11-19 LAB — LIPASE, BLOOD: Lipase: 28 U/L (ref 11–51)

## 2022-11-19 LAB — TROPONIN I (HIGH SENSITIVITY)
Troponin I (High Sensitivity): 11 ng/L (ref <18)
Troponin I (High Sensitivity): 13 ng/L (ref <18)

## 2022-11-19 MED ORDER — CIPROFLOXACIN HCL 500 MG PO TABS
500.0000 mg | ORAL_TABLET | Freq: Two times a day (BID) | ORAL | 0 refills | Status: AC
Start: 2022-11-19 — End: 2022-11-26
  Filled 2022-11-19 (×2): qty 14, 7d supply, fill #0

## 2022-11-19 MED ORDER — METRONIDAZOLE 500 MG PO TABS
500.0000 mg | ORAL_TABLET | Freq: Four times a day (QID) | ORAL | 0 refills | Status: AC
Start: 2022-11-19 — End: 2023-01-21
  Filled 2022-11-19 – 2023-01-07 (×3): qty 56, 14d supply, fill #0

## 2022-11-19 MED ORDER — ONDANSETRON 4 MG PO TBDP
4.0000 mg | ORAL_TABLET | Freq: Three times a day (TID) | ORAL | 0 refills | Status: DC | PRN
  Filled 2022-11-19: qty 8, 3d supply, fill #0

## 2022-11-19 MED ORDER — BISMUTH 262 MG PO CHEW
2.0000 | CHEWABLE_TABLET | Freq: Four times a day (QID) | ORAL | 0 refills | Status: AC
Start: 2022-11-19 — End: 2022-12-03
  Filled 2022-11-19: qty 120, 14d supply, fill #0

## 2022-11-19 MED ORDER — OMEPRAZOLE 20 MG PO CPDR
20.0000 mg | DELAYED_RELEASE_CAPSULE | Freq: Every day | ORAL | 0 refills | Status: DC
  Filled 2022-11-19: qty 14, 14d supply, fill #0

## 2022-11-19 NOTE — ED Triage Notes (Signed)
Patient complaining of chest pain ongoing for 2 days and nausea on going since last week since starting bismuth and tetracycline.  Patient describes chest pain as tight.

## 2022-11-19 NOTE — Discharge Instructions (Addendum)
Stop the tetracycline.  It appears you are on ciprofloxacin, which can treat the urinary tract infection it looks like you have.  A culture has been sent.  The Prilosec may help with the pains.  Follow-up with your doctor for further adjustment of your medications since you say you cannot tolerate these medicines.  The Zofran may help with the nausea.  Take the remaining pills of the Cipro and the culture could be followed by your PCP to see if you need different antibiotics for the urine.

## 2022-11-19 NOTE — ED Provider Notes (Signed)
[facility] Provider Note   CSN: [id number] Arrival date & time: 11/19/22  2035     History  Chief Complaint  Patient presents with   Chest Pain    Abigail Butler is a 57 y.o. female.  speaks [place] Arabic.  Family member translated since no Education administrator available for her language. Chest Pain Patient presents with chest pain.  Anterior chest.  Began after starting on treatment for H. pylori.  Pain has had for the last 2 days.  Worse after eating.  Has had some vomiting.  No fevers.  Also has dysuria.   Past Medical History:  Diagnosis Date   Cervical cancer screening 06/04/2021   Chronic left shoulder pain 03/23/2022   Daily headache    "sometimes; often" (02/10/2012)   Dysuria 10/26/2022   GERD (gastroesophageal reflux disease)    H. pylori infection    ?failed standard tx; currently undergoing levofloxacin salvage tx   H/O: CVA (cerebrovascular accident) 06/01/2013   Right thalamic CVA 2015   Health care maintenance 12/24/2013   Health care maintenance 12/24/2013   Hypertension 05/11/2017   Long term (current) use of anticoagulants 06/22/2021   Lower extremity edema 09/16/2022   Lumbar disc disease with radiculopathy 10/15/2014   Mass of left breast on mammogram 05/06/2015   Migraine without status migrainosus, not intractable 09/16/2022   Mixed dyslipidemia 11/12/2021   PAF (paroxysmal atrial fibrillation) (HCC) 11/12/2021   Prediabetes 10/15/2014   A1c 5.7 on 10/15/14   Right arm pain 10/15/2016   S/P Maze operation for atrial fibrillation 07/17/2021   S/P MVR (mitral valve replacement) 07/17/2021   TB lung, latent 2009   Treated in 2009 by [facility]   Thrombus of left atrial appendage 06/04/2021    Home Medications Prior to Admission medications   Medication Sig Start Date End Date Taking? Authorizing Provider  omeprazole (PRILOSEC) 20 MG capsule Take 1 capsule (20 mg total) by mouth  daily. 11/19/22  Yes Benjiman Core, MD  aspirin 81 MG EC tablet Take 1 tablet (81 mg total) by mouth daily. Swallow whole. 06/04/21   Gardenia Phlegm, MD  atorvastatin (LIPITOR) 40 MG tablet Take 1 tablet (40 mg total) by mouth daily. 02/10/22   Revankar, Aundra Dubin, MD  Bismuth 262 MG CHEW Chew 2 tablets by mouth 4 (four) times daily for 14 days. 11/19/22 12/03/22  Gwenevere Abbot, MD  ciprofloxacin (CIPRO) 500 MG tablet Take 1 tablet (500 mg total) by mouth 2 (two) times daily for 7 days. 11/19/22 11/26/22  Gwenevere Abbot, MD  metoprolol tartrate (LOPRESSOR) 25 MG tablet Take 1 tablet (25 mg total) by mouth 2 (two) times daily. 02/10/22   Revankar, Aundra Dubin, MD  metroNIDAZOLE (FLAGYL) 500 MG tablet Take 1 tablet (500 mg total) by mouth 4 (four) times daily for 14 days. 11/19/22 12/03/22  Gwenevere Abbot, MD  torsemide (DEMADEX) 10 MG tablet Take 2 tablets (20 mg total) by mouth daily. Patient not taking: Reported on 11/11/2022 09/17/22 09/17/23  Masters, Florentina Addison, DO      Allergies    Pork-derived products    Review of Systems   Review of Systems  Cardiovascular:  Positive for chest pain.    Physical Exam Updated Vital Signs BP (!) 133/49   Pulse 63   Temp 98 F (36.7 C)   Resp 16   Ht 5\' 5"  (1.651 m)   Wt 87.5 kg   SpO2 99%   BMI 32.12 kg/m  Physical Exam Vitals and nursing note reviewed.  Cardiovascular:     Rate and Rhythm: Regular rhythm.  Pulmonary:     Breath sounds: No decreased breath sounds or wheezing.  Chest:     Chest wall: Tenderness present.     Comments: Anterior chest tenderness. Abdominal:     Comments:  mild epigastric tenderness.  Neurological:     Mental Status: She is alert.     ED Results / Procedures / Treatments   Labs (all labs ordered are listed, but only abnormal results are displayed) Labs Reviewed  BASIC METABOLIC PANEL - Abnormal; Notable for the following components:      Result Value   CO2 20 (*)    Anion gap 16 (*)    All other components within  normal limits  URINALYSIS, ROUTINE W REFLEX MICROSCOPIC - Abnormal; Notable for the following components:   Hgb urine dipstick TRACE (*)    Bilirubin Urine SMALL (*)    Protein, ur 30 (*)    Nitrite POSITIVE (*)    Leukocytes,Ua SMALL (*)    All other components within normal limits  URINALYSIS, MICROSCOPIC (REFLEX) - Abnormal; Notable for the following components:   Bacteria, UA RARE (*)    All other components within normal limits  URINE CULTURE  CBC  LIPASE, BLOOD  TROPONIN I (HIGH SENSITIVITY)  TROPONIN I (HIGH SENSITIVITY)    EKG EKG Interpretation Date/Time:  Friday November 19 2022 20:57:00 EDT Ventricular Rate:  64 PR Interval:  184 QRS Duration:  89 QT Interval:  425 QTC Calculation: 439 R Axis:   67  Text Interpretation: Sinus rhythm Borderline repol abnormality, diffuse leads Confirmed by Benjiman Core [phone]) on 11/19/2022 11:35:33 PM  Radiology DG Chest 2 View  Result Date: 11/19/2022 CLINICAL DATA:  Chest pain EXAM: CHEST - 2 VIEW COMPARISON:  None Available. FINDINGS: 12/01/2021 lungs are clear. No pneumothorax or pleural effusion. Mitral valve replacement and left atrial clipping again noted. Cardiac size is mildly enlarged, stable. Central pulmonary vascular congestion without superimposed overt pulmonary edema again noted. No acute bone abnormality. IMPRESSION: 1. Mild cardiomegaly and central pulmonary vascular congestion. Electronically Signed   By: Helyn Numbers M.D.   On: 11/19/2022 22:54    Procedures Procedures    Medications Ordered in ED Medications - No data to display  ED Course/ Medical Decision Making/ A&P                                 Medical Decision Making Amount and/or Complexity of Data Reviewed Labs: ordered. Radiology: ordered.  Risk Prescription drug management.   Patient with chest pain.  Anterior chest.  Has had positive H. pylori test.  Began worst pain after taking tetracycline and bismuth.  Think this is less  likely the cause.  Patient states she cannot tolerate it.  Will stop for now but will need to discuss with PCP about changing medicines.  Also has had dysuria.  May have UTI.  Urine shows likely infection.  Appears to be just starting Cipro.  Will keep this going but cultures been sent.        Final Clinical Impression(s) / ED Diagnoses Final diagnoses:  Nonspecific chest pain  Acute cystitis without hematuria    Rx / DC Orders ED Discharge Orders          Ordered    omeprazole (PRILOSEC) 20 MG capsule  Daily  11/19/22 2327              Benjiman Core, MD 11/19/22 2336

## 2022-11-21 LAB — URINE CULTURE: Culture: NO GROWTH

## 2022-11-22 ENCOUNTER — Other Ambulatory Visit (HOSPITAL_BASED_OUTPATIENT_CLINIC_OR_DEPARTMENT_OTHER): Payer: Self-pay

## 2022-11-22 ENCOUNTER — Telehealth: Payer: Self-pay

## 2022-11-22 NOTE — Telephone Encounter (Signed)
Patients daughter called she stated the patient was recently seen in the ED for chest pain, patients daughter stated she is unable to take the medication Tetracycline and Bismuth,the medication is making her sick. Patient has a appointment scheduled for  10/09 Please return patients call.

## 2022-11-23 ENCOUNTER — Other Ambulatory Visit (HOSPITAL_BASED_OUTPATIENT_CLINIC_OR_DEPARTMENT_OTHER): Payer: Self-pay

## 2022-11-25 ENCOUNTER — Telehealth: Payer: Self-pay | Admitting: Internal Medicine

## 2022-11-25 NOTE — Telephone Encounter (Signed)
The patient's daughter initially called on 9/23 stating that her mother has not been able to take tetracycline or bismuth for her H. Pylori, as those medications are making her sick. I have attempted to call the patient on 9/23, 9/24, and on 9/26 and left a message with the assistance of an Arabic interpretor, as I have not been able to reach them.   The patient has already stopped her tetracycline and bismuth, but now needs to be switched to triple therapy with clarithromycin 500 mg bid, protonix 40 mg BID, and amoxicillin 1 g BID (or flagyl 500 mg TID in place of amoxicillin) for 14 days.  The patient has a visit with the [facility] on 10/8, but if she calls back sooner, please place her on this new regimen for H. Pylori.  Elza Rafter, DO Internal Medicine Resident, PGY-3

## 2022-12-07 ENCOUNTER — Ambulatory Visit (HOSPITAL_BASED_OUTPATIENT_CLINIC_OR_DEPARTMENT_OTHER)
Admission: RE | Admit: 2022-12-07 | Discharge: 2022-12-07 | Disposition: A | Discharge status: Home / Self Care | Payer: Medicaid Other | Source: Ambulatory Visit | Attending: Cardiology | Admitting: Cardiology

## 2022-12-07 DIAGNOSIS — I480 Paroxysmal atrial fibrillation: Secondary | ICD-10-CM | POA: Insufficient documentation

## 2022-12-07 LAB — ECHOCARDIOGRAM COMPLETE
AR max vel: 2 cm2
AV Area VTI: 1.9 cm2
AV Area mean vel: 1.89 cm2
AV Mean grad: 5 mm[Hg]
AV Peak grad: 8.3 mm[Hg]
AV Vena cont: 0.3 cm
Ao pk vel: 1.44 m/s
Area-P 1/2: 2.84 cm2
Calc EF: 58.1 %
MV VTI: 1.22 cm2
P 1/2 time: 1299 ms
S' Lateral: 3.1 cm
Single Plane A2C EF: 58.7 %
Single Plane A4C EF: 57.1 %

## 2022-12-08 ENCOUNTER — Encounter: Payer: Medicaid Other | Admitting: Student

## 2022-12-08 NOTE — Progress Notes (Deleted)
CC: ***  HPI:  [name] is a 57 y.o. female living with a history stated below and presents today for ***. Please see problem based assessment and plan for additional details.  Past Medical History:  Diagnosis Date   Cervical cancer screening 06/04/2021   Chronic left shoulder pain 03/23/2022   Daily headache    "sometimes; often" (02/10/2012)   Dysuria 10/26/2022   GERD (gastroesophageal reflux disease)    H. pylori infection    ?failed standard tx; currently undergoing levofloxacin salvage tx   H/O: CVA (cerebrovascular accident) 06/01/2013   Right thalamic CVA 2015   Health care maintenance 12/24/2013   Health care maintenance 12/24/2013   Hypertension 05/11/2017   Long term (current) use of anticoagulants 06/22/2021   Lower extremity edema 09/16/2022   Lumbar disc disease with radiculopathy 10/15/2014   Mass of left breast on mammogram 05/06/2015   Migraine without status migrainosus, not intractable 09/16/2022   Mixed dyslipidemia 11/12/2021   PAF (paroxysmal atrial fibrillation) (HCC) 11/12/2021   Prediabetes 10/15/2014   A1c 5.7 on 10/15/14   Right arm pain 10/15/2016   S/P Maze operation for atrial fibrillation 07/17/2021   S/P MVR (mitral valve replacement) 07/17/2021   TB lung, latent 2009   Treated in 2009 by [facility]   Thrombus of left atrial appendage 06/04/2021    Current Outpatient Medications on File Prior to Visit  Medication Sig Dispense Refill   aspirin 81 MG EC tablet Take 1 tablet (81 mg total) by mouth daily. Swallow whole. 150 tablet 2   atorvastatin (LIPITOR) 40 MG tablet Take 1 tablet (40 mg total) by mouth daily. 90 tablet 3   metoprolol tartrate (LOPRESSOR) 25 MG tablet Take 1 tablet (25 mg total) by mouth 2 (two) times daily. 180 tablet 3   omeprazole (PRILOSEC) 20 MG capsule Take 1 capsule (20 mg total) by mouth daily. 14 capsule 0   ondansetron (ZOFRAN-ODT) 4 MG disintegrating tablet Take 1 tablet (4 mg total) by  mouth every 8 (eight) hours as needed for nausea or vomiting. 8 tablet 0   torsemide (DEMADEX) 10 MG tablet Take 2 tablets (20 mg total) by mouth daily. (Patient not taking: Reported on 11/11/2022) 60 tablet 3   No current facility-administered medications on file prior to visit.     Review of Systems: ROS negative except for what is noted on the assessment and plan.  There were no vitals filed for this visit.  Physical Exam: Constitutional: Well appearing, not in acute distress. Cardiovascular: regular rate and rhythm, no m/r/g Pulmonary/Chest: normal work of breathing on room air, lungs clear to auscultation bilaterally Abdominal: soft, non-tender, non-distended  Assessment & Plan:   Patient {GC/GE:[id number]::"discussed with","seen with"} Dr. {WERXV:[id number]::"PPJKDTOI","Z. Hoffman","Mullen","Narendra","Vincent","Guilloud","Lau","Machen"}  H. pylori infection Started quadruple therapy for H. Pylori.  However patient was unable to tolerate tetracycline and bismuth and so this was stopped.Marland Kitchen  She was switched to clarithromycin, and amoxicillin PPI.    Confirmed that PPI was stopped for at least 1 to 2 weeks.  Stool antigen test, urea breath test.   GERD (gastroesophageal reflux disease) Will treat H. . Pylori infection and repeat endoscopy.    PAF (paroxysmal atrial fibrillation) (HCC) He was seen by cardiology on 09/12. Post mitral valve repair.  Recent echocardiogram 12/07/2022 shows an EF of 55 to 60%.    Warfarin?    Health care maintenance Upcoming Mammogram.   Lower extremity edema Pt was taking both furosemide and torsemide. Advised to take only torsemide  as instructed.     No problem-specific Assessment & Plan notes found for this encounter.   Laretta Bolster, MD [facility] Internal Medicine, PGY-1 Phone: [phone] Date 12/08/2022 Time 7:35 AM

## 2023-01-03 ENCOUNTER — Other Ambulatory Visit (HOSPITAL_COMMUNITY): Payer: Self-pay

## 2023-01-03 ENCOUNTER — Ambulatory Visit: Payer: Medicaid Other | Admitting: Internal Medicine

## 2023-01-03 ENCOUNTER — Encounter: Payer: Self-pay | Admitting: Internal Medicine

## 2023-01-03 VITALS — BP 142/94 | HR 57 | Temp 98.1°F | Ht 65.0 in | Wt 194.9 lb

## 2023-01-03 DIAGNOSIS — E119 Type 2 diabetes mellitus without complications: Secondary | ICD-10-CM | POA: Insufficient documentation

## 2023-01-03 DIAGNOSIS — A048 Other specified bacterial intestinal infections: Secondary | ICD-10-CM | POA: Diagnosis not present

## 2023-01-03 DIAGNOSIS — R7303 Prediabetes: Secondary | ICD-10-CM

## 2023-01-03 DIAGNOSIS — N898 Other specified noninflammatory disorders of vagina: Secondary | ICD-10-CM

## 2023-01-03 HISTORY — DX: Other specified noninflammatory disorders of vagina: N89.8

## 2023-01-03 HISTORY — DX: Type 2 diabetes mellitus without complications: E11.9

## 2023-01-03 LAB — POCT GLYCOSYLATED HEMOGLOBIN (HGB A1C): Hemoglobin A1C: 6.8 % — AB (ref 4.0–5.6)

## 2023-01-03 LAB — GLUCOSE, CAPILLARY: Glucose-Capillary: 114 mg/dL — ABNORMAL HIGH (ref 70–99)

## 2023-01-03 MED ORDER — BISMUTH/METRONIDAZ/TETRACYCLIN 140-125-125 MG PO CAPS
3.0000 | ORAL_CAPSULE | Freq: Four times a day (QID) | ORAL | 0 refills | Status: DC
  Filled 2023-01-03: qty 120, 10d supply, fill #0

## 2023-01-03 MED ORDER — FLUCONAZOLE 150 MG PO TABS
150.0000 mg | ORAL_TABLET | ORAL | 0 refills | Status: DC
Start: 2023-01-03 — End: 2023-01-03
  Filled 2023-01-03: qty 2, 6d supply, fill #0

## 2023-01-03 MED ORDER — FLUCONAZOLE 150 MG PO TABS
150.0000 mg | ORAL_TABLET | ORAL | 0 refills | Status: DC
  Filled 2023-01-03 – 2023-01-05 (×2): qty 2, 6d supply, fill #0

## 2023-01-03 MED ORDER — METFORMIN HCL ER 500 MG PO TB24
500.0000 mg | ORAL_TABLET | Freq: Two times a day (BID) | ORAL | 3 refills | Status: DC
  Filled 2023-01-03 – 2023-01-05 (×2): qty 60, 30d supply, fill #0

## 2023-01-03 MED ORDER — METFORMIN HCL ER 500 MG PO TB24
500.0000 mg | ORAL_TABLET | Freq: Two times a day (BID) | ORAL | 3 refills | Status: DC
  Filled 2023-01-03: qty 60, 30d supply, fill #0

## 2023-01-03 NOTE — Assessment & Plan Note (Signed)
A1c today is 6.8 from 6.4 at last visit.  -Metformin 500mg  BID started

## 2023-01-03 NOTE — Progress Notes (Signed)
Subjective:  CC: ED follow up, vaginal itching  HPI:  Ms.Dania Wease is a 57 y.o. female with a past medical history stated below and presents today for above. Please see problem based assessment and plan for additional details.  Past Medical History:  Diagnosis Date   Cervical cancer screening 06/04/2021   Chronic left shoulder pain 03/23/2022   Daily headache    "sometimes; often" (02/10/2012)   Diabetes mellitus (HCC) 01/03/2023   Dysuria 10/26/2022   GERD (gastroesophageal reflux disease)    H. pylori infection    ?failed standard tx; currently undergoing levofloxacin salvage tx   H/O: CVA (cerebrovascular accident) 06/01/2013   Right thalamic CVA 2015   Health care maintenance 12/24/2013   Health care maintenance 12/24/2013   Hypertension 05/11/2017   Long term (current) use of anticoagulants 06/22/2021   Lower extremity edema 09/16/2022   Lumbar disc disease with radiculopathy 10/15/2014   Mass of left breast on mammogram 05/06/2015   Migraine without status migrainosus, not intractable 09/16/2022   Mixed dyslipidemia 11/12/2021   PAF (paroxysmal atrial fibrillation) (HCC) 11/12/2021   Prediabetes 10/15/2014   A1c 5.7 on 10/15/14   Right arm pain 10/15/2016   S/P Maze operation for atrial fibrillation 07/17/2021   S/P MVR (mitral valve replacement) 07/17/2021   TB lung, latent 2009   Treated in 2009 by [facility]   Thrombus of left atrial appendage 06/04/2021    Current Outpatient Medications on File Prior to Visit  Medication Sig Dispense Refill   aspirin 81 MG EC tablet Take 1 tablet (81 mg total) by mouth daily. Swallow whole. 150 tablet 2   atorvastatin (LIPITOR) 40 MG tablet Take 1 tablet (40 mg total) by mouth daily. 90 tablet 3   metoprolol tartrate (LOPRESSOR) 25 MG tablet Take 1 tablet (25 mg total) by mouth 2 (two) times daily. 180 tablet 3   omeprazole (PRILOSEC) 20 MG capsule Take 1 capsule (20 mg total) by mouth daily. 14 capsule  0   ondansetron (ZOFRAN-ODT) 4 MG disintegrating tablet Take 1 tablet (4 mg total) by mouth every 8 (eight) hours as needed for nausea or vomiting. 8 tablet 0   torsemide (DEMADEX) 10 MG tablet Take 2 tablets (20 mg total) by mouth daily. (Patient not taking: Reported on 11/11/2022) 60 tablet 3   No current facility-administered medications on file prior to visit.    Review of Systems: ROS negative except for as is noted on the assessment and plan.  Objective:   Vitals:   01/03/23 0901  BP: (!) 142/94  Pulse: (!) 57  Temp: 98.1 F (36.7 C)  TempSrc: Oral  SpO2: 99%  Weight: 194 lb 14.4 oz (88.4 kg)  Height: 5\' 5"  (1.651 m)    Physical Exam: Constitutional: well-appearing, in no acute distress HENT: normocephalic atraumatic, mucous membranes moist Eyes: conjunctiva non-erythematous Neck: supple Cardiovascular: regular rate and rhythm, no m/r/g Pulmonary/Chest: normal work of breathing on room air, lungs clear to auscultation bilaterally Abdominal: soft, diffusely tender to palpation MSK: normal bulk and tone Neurological: alert & oriented x 3, 5/5 strength in bilateral upper and lower extremities, normal gait Skin: warm and dry  Assessment & Plan:   H. pylori infection Quadruple therapy for Hpylori was started at last clinic visit. Patient did not complete antibiotics as she felt it caused dizziness, nausea, and itching. She was seen in the ED for these symptoms, workup was overall reassuring. Notably, patient has been started on therapy for Hpylori numerous times  without patient completing course of antibiotics. Patient was also referred to GI for possible endoscopy, GI noted they attempted to schedule but were not able to contact. Continues to have symptoms of dyspepsia.  -Pylera (Bismuth metronidazole tetracycline) prescribed for 10 days -Continue omeprazole 20mg  daily -Information to schedule with GI provided  Vaginal itching Patient reports vaginal itching, whitish  discharge, and dysuria for the past several weeks. Patient was started on ciprofloxacin at last visit for treatment of UTI, which she did not complete. Symptoms are most consistent with a candidal yeast infection. Patient deferred vaginal exam.  -Diflucan 150mg  q72hrs for two doses prescribed -Will reevaluate for UTI after treatment of yeast infection  Diabetes mellitus (HCC) A1c today is 6.8 from 6.4 at last visit.  -Metformin 500mg  BID started    Patient discussed with Dr. Alan Ripper MD [facility] Internal Medicine  PGY-1 Pager: [phone] Date 01/03/2023  Time 1:37 PM

## 2023-01-03 NOTE — Patient Instructions (Addendum)
 ??? ?? ????? ????? ?? ????? ??? ?????.   ??????? ????? ????? ?? ????? ??? ?? ????? ???? ???????????. ???? ????? ??? ????? ?? ?????? ???? ????? ?? ?????. ??????? ??????? ??????? ???? ?????? ?? ??????? ??????. ??? ??? ????? ?? ???? ???? ????? ??? ????? ??? ?? ?????.   ????? ????????? ??????? ??? ?? ?????? ???????? ????? ????????. ??????? ??? ?????? ???? ????? ???? ?????? ????? ???????.   ??????? ????? ??????? ???????? ???? ???? ????? ?? ????? ???? ????? ???? ??????. ??? ?? ???? ???? ???? ???? ???? ?????. ??? ????? ??????? ?????? ?????? ??????. ???? ??????? ??? ?????? ????.   ????? ????? ?????? ????? ?????? ??????   ???? ?????? ?????? ?? ?????????? ????????? ????????   ??? ??: ???? ????? ?? ?????? ?????   [address] ?? ????? ???????: [address], [place], [place] [address]   ??????: [phone]  ????? ?????? ??? ???? ???? ??? ??????? ???? ??? ???? ??????.   ??????  ????? ????? alsayidat darfur kan min dawaei sururi 'an 'altaqi bikum alyawma. bialnisbat linisbat alsukar fi aldum, 'asif lak dwa'an yusamaa almitfwrmin. yurjaa tanawul habat wahidat fi alsabah wahabatan wahidatan fi allayl. altaathir aljanibiu alrayiysiu lihadha aldawa' hu alghathayan aleardi. 'iidha kunt tueani min hadha, yurjaa tanawul habat wahidat faqat fi alyawmi. lieilaj al'iifrazat walhakati, 'asif lak mdadan lilfitriaat yusma diflukan. satakhudhin habatan wahidatan, wabaed thalathat 'ayaam takhudhin alhabat althaaniata. bialnisbat lieadwaa almilwiat albawaabiat alati tasabab alman fi sadrika, yurjaa tanawul hubub bilira. yajib 'an takhudh thalath habaat 'arbae maraat yawmia. huna aydan maelumat li'atibaa' aljihaz alhadami. yurjaa alaitisal bihim litahdid maweidi. makhrut alsihat liubawir 'amrad aljihaz alhadmii   tabib aljihaz alhadmii fi jirinsburu, [employer]   yaqae fi: [employer] sidni 'iif libawar altibiyi 520 'iina 'iilam aleunwani: [address] [place] [address], [place], [place] [address]   alhatifi: [phone] sanukhatit liruyatik Mervyn Skeeters 'ukhraa khilal shahr tqryban Deno Etienne tasir al'umuru. shkran, duktur Lavell Islam

## 2023-01-03 NOTE — Assessment & Plan Note (Signed)
Patient reports vaginal itching, whitish discharge, and dysuria for the past several weeks. Patient was started on ciprofloxacin at last visit for treatment of UTI, which she did not complete. Symptoms are most consistent with a candidal yeast infection. Patient deferred vaginal exam.  -Diflucan 150mg  q72hrs for two doses prescribed -Will reevaluate for UTI after treatment of yeast infection

## 2023-01-03 NOTE — Assessment & Plan Note (Signed)
Quadruple therapy for Hpylori was started at last clinic visit. Patient did not complete antibiotics as she felt it caused dizziness, nausea, and itching. She was seen in the ED for these symptoms, workup was overall reassuring. Notably, patient has been started on therapy for Hpylori numerous times without patient completing course of antibiotics. Patient was also referred to GI for possible endoscopy, GI noted they attempted to schedule but were not able to contact. Continues to have symptoms of dyspepsia.  -Pylera (Bismuth metronidazole tetracycline) prescribed for 10 days -Continue omeprazole 20mg  daily -Information to schedule with GI provided

## 2023-01-05 ENCOUNTER — Other Ambulatory Visit (HOSPITAL_COMMUNITY): Payer: Self-pay

## 2023-01-05 ENCOUNTER — Other Ambulatory Visit: Payer: Self-pay | Admitting: Cardiology

## 2023-01-05 ENCOUNTER — Other Ambulatory Visit (HOSPITAL_BASED_OUTPATIENT_CLINIC_OR_DEPARTMENT_OTHER): Payer: Self-pay

## 2023-01-05 DIAGNOSIS — I480 Paroxysmal atrial fibrillation: Secondary | ICD-10-CM

## 2023-01-05 MED ORDER — METOPROLOL TARTRATE 25 MG PO TABS
25.0000 mg | ORAL_TABLET | Freq: Two times a day (BID) | ORAL | 3 refills | Status: DC
Start: 2023-01-05 — End: 2023-04-29
  Filled 2023-01-05 – 2023-01-07 (×2): qty 180, 90d supply, fill #0
  Filled 2023-01-07: qty 60, 30d supply, fill #0
  Filled 2023-01-07: qty 180, 90d supply, fill #0
  Filled 2023-02-07: qty 60, 30d supply, fill #1
  Filled 2023-03-11: qty 60, 30d supply, fill #2
  Filled 2023-04-07: qty 60, 30d supply, fill #3

## 2023-01-06 ENCOUNTER — Other Ambulatory Visit (HOSPITAL_BASED_OUTPATIENT_CLINIC_OR_DEPARTMENT_OTHER): Payer: Self-pay

## 2023-01-06 ENCOUNTER — Other Ambulatory Visit: Payer: Self-pay | Admitting: Cardiology

## 2023-01-06 DIAGNOSIS — Z8673 Personal history of transient ischemic attack (TIA), and cerebral infarction without residual deficits: Secondary | ICD-10-CM

## 2023-01-06 MED ORDER — ATORVASTATIN CALCIUM 40 MG PO TABS
40.0000 mg | ORAL_TABLET | Freq: Every day | ORAL | 2 refills | Status: DC
Start: 2023-01-06 — End: 2023-12-01
  Filled 2023-01-06 – 2023-02-07 (×2): qty 90, 90d supply, fill #0
  Filled 2023-05-17: qty 90, 90d supply, fill #1
  Filled 2023-08-16: qty 90, 90d supply, fill #2

## 2023-01-07 ENCOUNTER — Other Ambulatory Visit (HOSPITAL_BASED_OUTPATIENT_CLINIC_OR_DEPARTMENT_OTHER): Payer: Self-pay

## 2023-01-07 NOTE — Progress Notes (Signed)
Internal Medicine Clinic Attending  I was physically present during the key portions of the resident provided service and participated in the medical decision making of patient's management care. I reviewed pertinent patient test results.  The assessment, diagnosis, and plan were formulated together and I agree with the documentation in the resident's note.  Reymundo Poll, MD

## 2023-02-07 ENCOUNTER — Other Ambulatory Visit (HOSPITAL_BASED_OUTPATIENT_CLINIC_OR_DEPARTMENT_OTHER): Payer: Self-pay

## 2023-02-15 ENCOUNTER — Ambulatory Visit: Payer: Medicaid Other | Admitting: Student

## 2023-02-15 ENCOUNTER — Other Ambulatory Visit: Payer: Self-pay

## 2023-02-15 ENCOUNTER — Encounter: Payer: Self-pay | Admitting: Student

## 2023-02-15 ENCOUNTER — Other Ambulatory Visit: Payer: Self-pay | Admitting: Student

## 2023-02-15 ENCOUNTER — Other Ambulatory Visit (HOSPITAL_COMMUNITY): Payer: Self-pay

## 2023-02-15 VITALS — BP 137/44 | HR 59 | Temp 98.1°F | Ht 65.0 in | Wt 193.8 lb

## 2023-02-15 DIAGNOSIS — N390 Urinary tract infection, site not specified: Secondary | ICD-10-CM | POA: Diagnosis present

## 2023-02-15 DIAGNOSIS — Z7984 Long term (current) use of oral hypoglycemic drugs: Secondary | ICD-10-CM

## 2023-02-15 DIAGNOSIS — A048 Other specified bacterial intestinal infections: Secondary | ICD-10-CM | POA: Diagnosis not present

## 2023-02-15 DIAGNOSIS — E119 Type 2 diabetes mellitus without complications: Secondary | ICD-10-CM | POA: Diagnosis not present

## 2023-02-15 LAB — POCT URINALYSIS DIPSTICK
Bilirubin, UA: NEGATIVE
Glucose, UA: NEGATIVE
Ketones, UA: NEGATIVE
Nitrite, UA: NEGATIVE
Protein, UA: POSITIVE — AB
Spec Grav, UA: 1.02 (ref 1.010–1.025)
Urobilinogen, UA: 1 U/dL
pH, UA: 6.5 (ref 5.0–8.0)

## 2023-02-15 MED ORDER — CEPHALEXIN 500 MG PO CAPS
500.0000 mg | ORAL_CAPSULE | Freq: Two times a day (BID) | ORAL | 0 refills | Status: AC
  Filled 2023-02-15 – 2023-02-18 (×2): qty 10, 5d supply, fill #0

## 2023-02-15 NOTE — Progress Notes (Signed)
CC: blurry vision and abdominal pain follow up  HPI:  [name] is a 56 y.o. female living with a history stated below and presents today for blurry vision and abdominal pain follow up. Please see problem based assessment and plan for additional details.  Past Medical History:  Diagnosis Date   Cervical cancer screening 06/04/2021   Chronic left shoulder pain 03/23/2022   Daily headache    "sometimes; often" (02/10/2012)   Diabetes mellitus (HCC) 01/03/2023   Dysuria 10/26/2022   GERD (gastroesophageal reflux disease)    H. pylori infection    ?failed standard tx; currently undergoing levofloxacin salvage tx   H/O: CVA (cerebrovascular accident) 06/01/2013   Right thalamic CVA 2015   Health care maintenance 12/24/2013   Health care maintenance 12/24/2013   Hypertension 05/11/2017   Long term (current) use of anticoagulants 06/22/2021   Lower extremity edema 09/16/2022   Lumbar disc disease with radiculopathy 10/15/2014   Mass of left breast on mammogram 05/06/2015   Migraine without status migrainosus, not intractable 09/16/2022   Mixed dyslipidemia 11/12/2021   PAF (paroxysmal atrial fibrillation) (HCC) 11/12/2021   Prediabetes 10/15/2014   A1c 5.7 on 10/15/14   Right arm pain 10/15/2016   S/P Maze operation for atrial fibrillation 07/17/2021   S/P MVR (mitral valve replacement) 07/17/2021   TB lung, latent 2009   Treated in 2009 by [facility]   Thrombus of left atrial appendage 06/04/2021    Current Outpatient Medications on File Prior to Visit  Medication Sig Dispense Refill   aspirin 81 MG EC tablet Take 1 tablet (81 mg total) by mouth daily. Swallow whole. 150 tablet 2   atorvastatin (LIPITOR) 40 MG tablet Take 1 tablet (40 mg total) by mouth daily. 90 tablet 2   Bismuth/Metronidaz/Tetracyclin (PYLERA) 140-125-125 MG CAPS Take 3 capsules by mouth in the morning, at noon, in the evening, and at bedtime for 10 days. 120 capsule 0   fluconazole  (DIFLUCAN) 150 MG tablet Take 1 tablet (150 mg total) by mouth every 3 (three) days. 2 tablet 0   metFORMIN (GLUCOPHAGE-XR) 500 MG 24 hr tablet Take 1 tablet (500 mg total) by mouth 2 (two) times daily with a meal. 60 tablet 3   metoprolol tartrate (LOPRESSOR) 25 MG tablet Take 1 tablet (25 mg total) by mouth 2 (two) times daily. 180 tablet 3   omeprazole (PRILOSEC) 20 MG capsule Take 1 capsule (20 mg total) by mouth daily. 14 capsule 0   ondansetron (ZOFRAN-ODT) 4 MG disintegrating tablet Take 1 tablet (4 mg total) by mouth every 8 (eight) hours as needed for nausea or vomiting. 8 tablet 0   torsemide (DEMADEX) 10 MG tablet Take 2 tablets (20 mg total) by mouth daily. (Patient not taking: Reported on 11/11/2022) 60 tablet 3   No current facility-administered medications on file prior to visit.     Review of Systems: ROS negative except for what is noted on the assessment and plan.  Vitals:   02/15/23 1049 02/15/23 1057  BP: (!) 138/45 (!) 137/44  Pulse: (!) 59 (!) 59  Temp: 98.1 F (36.7 C)   TempSrc: Oral   SpO2: 100%   Weight: 193 lb 12.8 oz (87.9 kg)   Height: 5\' 5"  (1.651 m)     Physical Exam: Constitutional: Well appearing, not in acute distress. Cardiovascular: regular rate and rhythm, no m/r/g Pulmonary/Chest: normal work of breathing on room air, lungs clear to auscultation bilaterally Abdominal: soft,mild epigastric pain TTP, normal bowel  sounds.   Assessment & Plan:   Patient seen with Dr. Cleda Daub  H. pylori infection The patient reports an improvement in dyspepsia symptoms. She has a recent history of being intolerant to quadruple therapy for H. pylori, due to nausea, itching, and dizziness. The patient was also referred to gastroenterology for a possible endoscopy. Outpatient gastroenterology has attempted to schedule the appointment but has been unable to reach her.  I have provided the patient's daughters with the [facility] phone number to help get that  appointment scheduled.  Plan: - Outpatient GI already placed, family will call to get that appt scheduled. - Continue omeprazole  20 mg.  Urinary tract infection without hematuria Vaginal itching and discharge has resolved with the antifungal medication that was prescribed.  She is endorsing dysuria,and increased urinary frequency.  On my exams, lower abdomen tender to palpation. Will get a UA.  - UA positive for trace LE, and proteins, but negative for nitrites. - Will treat with Keflex 500 mg BID for 5 days.   Diabetes mellitus (HCC) A1c 6.4 about 1 month ago. Reporting blurry vision, been on going for about 1-2 months. Pt has not done her yearly eye exam yet. Will place a referral for her vision to be evaluated further. Pt also requesting a referral for dentist appointment, advised that we don't place referrals unless pt's are uninsured.  She was encouraged to call medicaid to see which providers within the area fall within their network.   - referral to ophthalmology - continue Metformin 500 mg BID.   Laretta Bolster, MD [facility], PGY-1 Phone: [phone] Date 02/15/2023 Time 3:44 PM

## 2023-02-15 NOTE — Patient Instructions (Addendum)
Thank you, Abigail Butler for allowing [place] to provide your care today.   Today we discussed :  For the pain with urination, I will get a urine sample, to make sure you are not having urinary infection.   2.  For your abdominal pain, I would like for you to see a GI doctors.  Please make sure your daughter calls them back to help with scheduling your appointment.  3.  I will put in a referral for you to see an eye doctor, and a dentist.  I have ordered the following labs for you:  Lab Orders         POCT Urinalysis Dipstick (69629)       Referrals ordered today:   Referral Orders  No referral(s) requested today     I have ordered the following medication/changed the following medications:   Stop the following medications: There are no discontinued medications.   Start the following medications: No orders of the defined types were placed in this encounter.    Follow up: 2 months     Should you have any questions or concerns please call the internal medicine clinic at [phone].    Laretta Bolster, MD  [facility]

## 2023-02-15 NOTE — Assessment & Plan Note (Addendum)
Vaginal itching and discharge has resolved with the antifungal medication that was prescribed.  She is endorsing dysuria,and increased urinary frequency.  On my exams, lower abdomen tender to palpation. Will get a UA.  - UA positive for trace LE, and proteins, but negative for nitrites. - Will treat with Keflex 500 mg BID for 5 days.

## 2023-02-15 NOTE — Assessment & Plan Note (Signed)
A1c 6.4 about 1 month ago. Reporting blurry vision, been on going for about 1-2 months. Pt has not done her yearly eye exam yet. Will place a referral for her vision to be evaluated further. Pt also requesting a referral for dentist appointment, advised that we don't place referrals unless pt's are uninsured.  She was encouraged to call medicaid to see which providers within the area fall within their network.   - referral to ophthalmology - continue Metformin 500 mg BID.

## 2023-02-15 NOTE — Assessment & Plan Note (Addendum)
The patient reports an improvement in dyspepsia symptoms. She has a recent history of being intolerant to quadruple therapy for H. pylori, due to nausea, itching, and dizziness. The patient was also referred to gastroenterology for a possible endoscopy. Outpatient gastroenterology has attempted to schedule the appointment but has been unable to reach her.  I have provided the patient's daughters with the [facility] phone number to help get that appointment scheduled.  Plan: - Outpatient GI already placed, family will call to get that appt scheduled. - Continue omeprazole  20 mg.

## 2023-02-17 ENCOUNTER — Other Ambulatory Visit (HOSPITAL_BASED_OUTPATIENT_CLINIC_OR_DEPARTMENT_OTHER): Payer: Self-pay

## 2023-02-17 ENCOUNTER — Ambulatory Visit: Payer: Medicaid Other

## 2023-02-18 ENCOUNTER — Other Ambulatory Visit (HOSPITAL_COMMUNITY): Payer: Self-pay

## 2023-02-18 ENCOUNTER — Other Ambulatory Visit (HOSPITAL_BASED_OUTPATIENT_CLINIC_OR_DEPARTMENT_OTHER): Payer: Self-pay

## 2023-02-21 NOTE — Progress Notes (Signed)
Internal Medicine Clinic Attending  I was physically present during the key portions of the resident provided service and participated in the medical decision making of patient's management care. I reviewed pertinent patient test results.  The assessment, diagnosis, and plan were formulated together and I agree with the documentation in the resident's note.  Gust Rung, DO

## 2023-03-07 ENCOUNTER — Encounter: Payer: Self-pay | Admitting: Nurse Practitioner

## 2023-03-11 ENCOUNTER — Other Ambulatory Visit (HOSPITAL_BASED_OUTPATIENT_CLINIC_OR_DEPARTMENT_OTHER): Payer: Self-pay

## 2023-03-13 NOTE — Progress Notes (Addendum)
 03/13/2023 Abigail Butler [id number] [birth year]   CHIEF COMPLAINT: Abdominal pain  HISTORY OF PRESENT ILLNESS: Abigail Butler is a 58 year old female with a past medical history of hypertension, paroxysmal atrial fibrillation s/p Maze 06/2021,severe mitral valve stensosis s/p MVR 06/2021, CVA 2014, DM type II, latent TB (treated), GERD and H. Pylori. Previously known by Dr. Howard Macho.  She is originally from [place] and speaks Arabic therefore she is accompanied by a [employer] Arabic interpreter to facilitate communication throughout today's office visit.  Her daughter is also present who speaks limited [place].  Obtaining the patient's history was challenging.   She underwent an EGD 09/03/2015 which identified H. pylori gastritis.  She was prescribed Metronidazole  500 mg 1 tab 4 times daily, Tetracycline  500 mg 1 tab 4 times daily, bismuth  subsalicylate 2 tabs 4 times daily and PPI twice daily.  However, patient did not take this treatment as prescribed.   She underwent repeat H. pylori stool antigen 04/08/2017 which was positive, she did not take the treatment that was prescribed at that time.  She was seen by her PCP due to having worsening GERD symptoms and stomach pain. She underwent H. pylori stool antigen testing 09/16/2022 which was positive.  She was prescribed Cipro  500 mg twice daily, metronidazole  500 mg 4 times daily, tetracycline  500 mg 4 times daily, with bismuth  salicylate 2 tabs 4 times daily and pantoprazole  40 mg twice daily by her PCP on 10/25/2022.  She did not complete this treatment as she developed N/V and chest pain after starting the H. pylori treatment which required an ED evaluation 11/19/2022.  It is unclear how many days of the quadruple therapy she completed.  She was seen by her PCP 01/03/2023 who prescribed Pylera  3 tabs 4 times daily x 10 days, Omeprazole  20 mg once daily which she has not taken.    She presents today as referred by Dr. Carolyn Guilloud for further evaluation  regarding epigastric pain with positive H. Pylori status. Patient stated her upper abdominal pain about 6 months s/p Maze procedure and MVR 06/2021. She also endorses having generalized abdominal bloat.  She has intermittent heartburn.  No dysphagia.  Her epigastric pain worsens after she eats fatty foods or meat.  She has frequent nausea without vomiting.  She typically passes a normal formed brown bowel movement sometimes notices black spots in the stool.  No loose black tarry stools.  No bright red blood per the rectum.  She takes ASA 81 mg daily, no other NSAID use.  Non-smoker.  No alcohol use.  He denies ever being a screening colonoscopy.  No known family history of esophageal, gastric or colon cancer.  Patient has a history of atrial fibrillation and MR status post maze and MVR surgery.  She is followed by cardiologist Dr. Lafayette Pierre.  She denies having any chest pain or SOB.      Latest Ref Rng & Units 11/19/2022    9:05 PM 10/25/2022   11:06 AM 09/16/2022    4:56 PM  CBC  WBC 4.0 - 10.5 K/uL 8.8  7.8  8.0   Hemoglobin 12.0 - 15.0 g/dL 57.8  46.9  62.9   Hematocrit 36.0 - 46.0 % 37.9  35.0  35.8   Platelets 150 - 400 K/uL 262  225  249         Latest Ref Rng & Units 11/19/2022    9:05 PM 10/25/2022   11:06 AM 09/16/2022    4:56 PM  CMP  Glucose 70 - 99 mg/dL 98  440  102   BUN 6 - 20 mg/dL 9  12  12    Creatinine 0.44 - 1.00 mg/dL 7.25  3.66  4.40   Sodium 135 - 145 mmol/L 139  143  142   Potassium 3.5 - 5.1 mmol/L 3.8  4.3  4.4   Chloride 98 - 111 mmol/L 103  107  108   CO2 22 - 32 mmol/L 20  19  19    Calcium  8.9 - 10.3 mg/dL 8.9  9.0  9.3     H. Pylori stool antigen positive on 04/08/2017 and  09/16/2022  ECHO 12/07/2022: IMPRESSIONS Left ventricular ejection fraction, by estimation, is 55 to 60%. The left ventricle has normal function. The left ventricle has no regional wall motion abnormalities. Indeterminate diastolic filling due to E-A fusion. 1. Right ventricular systolic  function is mildly reduced. The right ventricular size is mildly enlarged. There is normal pulmonary artery systolic pressure. 2. 3. Left atrial size was severely dilated. 4. Right atrial size was moderately dilated. The mitral valve has been repaired/replaced. Bioprosthetic valve (27mm Edwards) well seated, leaflets poorly visualized. No evidence of mitral valve regurgitation. The mean mitral valve gradient is 5.0 mmHg with average heart rate of 56 bpm. MV-VTI/LVOT-VTI 1.8. 5. The aortic valve is tricuspid. Aortic valve regurgitation is mild. No aortic stenosis is present. 6. The inferior vena cava is normal in size with greater than 50% respiratory variability, suggesting right atrial pressure of 3 mmHg.  PAST GI PROCEDURES:  EGD 09/03/2015: - Normal esophagus.  - Gastritis. Biopsied.  - Normal examined duodenum CHRONIC ACTIVE GASTRITIS ASSOCIATED WITH HELICOBACTER PYLORI ORGANISMS. - INTESTINAL METAPLASIA PRESENT. - NO DYSPLASIA OR MALIGNANCY IDENTIFIED.  Past Medical History:  Diagnosis Date   Cervical cancer screening 06/04/2021   Chronic left shoulder pain 03/23/2022   Daily headache    "sometimes; often" (02/10/2012)   Diabetes mellitus (HCC) 01/03/2023   Dysuria 10/26/2022   GERD (gastroesophageal reflux disease)    H. pylori infection    ?failed standard tx; currently undergoing levofloxacin  salvage tx   H/O: CVA (cerebrovascular accident) 06/01/2013   Right thalamic CVA 2015   Health care maintenance 12/24/2013   Health care maintenance 12/24/2013   Hypertension 05/11/2017   Long term (current) use of anticoagulants 06/22/2021   Lower extremity edema 09/16/2022   Lumbar disc disease with radiculopathy 10/15/2014   Mass of left breast on mammogram 05/06/2015   Migraine without status migrainosus, not intractable 09/16/2022   Mixed dyslipidemia 11/12/2021   PAF (paroxysmal atrial fibrillation) (HCC) 11/12/2021   Prediabetes 10/15/2014   A1c 5.7 on 10/15/14   Right arm pain  10/15/2016   S/P Maze operation for atrial fibrillation 07/17/2021   S/P MVR (mitral valve replacement) 07/17/2021   TB lung, latent 2009   Treated in 2009 by [facility]   Thrombus of left atrial appendage 06/04/2021   Past Surgical History:  Procedure Laterality Date   DILATION AND CURETTAGE OF UTERUS  ~ 2008   "cleaned out infection" (02/10/2012)   LEFT HEART CATH AND CORONARY ANGIOGRAPHY N/A 04/19/2017   Procedure: LEFT HEART CATH AND CORONARY ANGIOGRAPHY;  Surgeon: [place], Peter M, MD;  Location: [facility] INVASIVE CV LAB;  Service: Cardiovascular;  Laterality: N/A;   MAZE N/A 07/17/2021   Procedure: MAZE;  Surgeon: Bartley Lightning, MD;  Location: [facility] OR;  Service: Open Heart Surgery;  Laterality: N/A;   MITRAL VALVE REPLACEMENT N/A 07/17/2021   Procedure: MITRAL VALVE  REPLACEMENT USING 27 MM MITRIS VALVE.  CRYO AND RADIOFREQUENCY MAZE PROCEDURE. LIGATION OF LEFT ATRIAL APPENDAGE.;  Surgeon: Bartley Lightning, MD;  Location: [facility] OR;  Service: Open Heart Surgery;  Laterality: N/A;   TEE WITHOUT CARDIOVERSION N/A 05/27/2021   Procedure: TRANSESOPHAGEAL ECHOCARDIOGRAM (TEE);  Surgeon: Wendie Hamburg, MD;  Location: [facility] ENDOSCOPY;  Service: Cardiovascular;  Laterality: N/A;   TEE WITHOUT CARDIOVERSION N/A 07/17/2021   Procedure: TRANSESOPHAGEAL ECHOCARDIOGRAM (TEE);  Surgeon: Bartley Lightning, MD;  [facility];  Service: Open Heart Surgery;  Laterality: N/A;   Social History: She is married.  Nonsmoker. No alcohol use. No drug use.   Family History: No known family history of esophageal, gastric or colorectal cancer.  Allergies  Allergen Reactions   Pork-Derived Products Other (See Comments)    Religous belief      Outpatient Encounter Medications as of 03/14/2023  Medication Sig   aspirin  81 MG EC tablet Take 1 tablet (81 mg total) by mouth daily. Swallow whole.   atorvastatin  (LIPITOR) 40 MG tablet Take 1 tablet (40 mg total) by mouth daily.    Bismuth /Metronidaz/Tetracyclin (PYLERA ) 140-125-125 MG CAPS Take 3 capsules by mouth in the morning, at noon, in the evening, and at bedtime for 10 days.   fluconazole  (DIFLUCAN ) 150 MG tablet Take 1 tablet (150 mg total) by mouth every 3 (three) days.   metFORMIN  (GLUCOPHAGE -XR) 500 MG 24 hr tablet Take 1 tablet (500 mg total) by mouth 2 (two) times daily with a meal.   metoprolol  tartrate (LOPRESSOR ) 25 MG tablet Take 1 tablet (25 mg total) by mouth 2 (two) times daily.   omeprazole  (PRILOSEC) 20 MG capsule Take 1 capsule (20 mg total) by mouth daily.   ondansetron  (ZOFRAN -ODT) 4 MG disintegrating tablet Take 1 tablet (4 mg total) by mouth every 8 (eight) hours as needed for nausea or vomiting.   torsemide  (DEMADEX ) 10 MG tablet Take 2 tablets (20 mg total) by mouth daily. (Patient not taking: Reported on 11/11/2022)   No facility-administered encounter medications on file as of 03/14/2023.   REVIEW OF SYSTEMS:  Gen: Denies fever, sweats or chills. No weight loss.  CV: Denies chest pain, palpitations or edema. Resp: Denies cough, shortness of breath of hemoptysis.  GI: See HPI. GU: Denies urinary burning, blood in urine, increased urinary frequency or incontinence. MS: Denies joint pain, muscles aches or weakness. Derm: Denies rash, itchiness, skin lesions or unhealing ulcers. Psych: Denies depression, anxiety, memory loss or confusion. Heme: Denies bruising, easy bleeding. Neuro:  Denies headaches, dizziness or paresthesias. Endo:  Denies any problems with DM, thyroid or adrenal function.  PHYSICAL EXAM: Ht 5' 1.75" (1.568 m) Comment: height measured without shoes  Wt 194 lb 4 oz (88.1 kg)   BMI 35.82 kg/m   General: 58 year old female in no acute distress. Head: Normocephalic and atraumatic. Eyes:  Sclerae non-icteric, conjunctive pink. Ears: Normal auditory acuity. Mouth: Dentition intact. No ulcers or lesions.  Neck: Supple, no lymphadenopathy or thyromegaly.  Lungs: Clear  bilaterally to auscultation without wheezes, crackles or rhonchi. Heart: Regular rate and rhythm. No murmur, rub or gallop appreciated.  Abdomen: Soft, nontender, nondistended. No masses. No hepatosplenomegaly. Normoactive bowel sounds x 4 quadrants.  Rectal: Deferred.  Musculoskeletal: Symmetrical with no gross deformities. Skin: Warm and dry. No rash or lesions on visible extremities. Extremities: No edema. Neurological: Alert oriented x 4, no focal deficits.  Psychological:  Alert and cooperative. Normal mood and affect.  ASSESSMENT AND PLAN:  58 year old [place] female  with a history of H. pylori gastritis confirmed by EGD since 2017, initially prescribed quadruple therapy, patient did not take.  She underwent H. pylori stool antigen testing per her PCP 08/2022 which was positive, prescribed Cipro /metronidazole /tetracycline /bismuth  and PPI which was discontinued secondary to chest pain thought to be as an adverse response to this regimen.  She was seen by her PCP for follow-up 01/03/2023 who prescribed Pylera  as an alternative H. pylori treatment which the patient has not taken. -Patient is 58 years old female from [place] with a history of H. pylori since 2017 at increased risk for gastric carcinoma.  EGD procedure benefits and risk discussed in full detail. -Omeprazole  40 mg daily -Further recommendations/ H. Pylori treatment to be determined after EGD completed -Cardiac clearance requested prior to the patient proceeding with an EGD  Colon cancer screening.  Patient denies ever having a screening colonoscopy. -Colonoscopy at time of EGD, benefits and risks discussed including risk with sedation, risk of bleeding, perforation and infection  -Cardiac clearance requested prior to patient proceeding with a colonoscopy  History of paroxysmal atrial fibrillation s/p Maze 06/2021 on ASA and severe mitral stenosis s/p MVR 06/2021. Coronary angiography in 2019 revealed normal coronary arteries. ECHO  11/2022 with LVEF 55 - 60%. Patient is concerned about her risk for any invasive procedure due to her cardiac history therefore I will request an official cardiac clearance prior to the patient proceeding with an EGD and colonoscopy.  No angina or shortness of breath.  History of CVA 2014 or 2015  DM type II  ADDENDUM: Cardiac clearance per Lawana Pray NP received 06/21/2023 as follows:  Primary Cardiologist: Nelia Balzarine, MD   Chart reviewed as part of pre-operative protocol coverage. Given past medical history and time since last visit, based on ACC/AHA guidelines, Camia Homes would be at acceptable risk for the planned procedure without further cardiovascular testing.    Patient was advised that if she develops new symptoms prior to surgery to contact our office to arrange a follow-up appointment.  He verbalized understanding.   Her aspirin  may be held for 7 days prior to her procedure.  Please resume as soon as hemostasis is achieved at the discretion of the surgeon.   I will route this recommendation to the requesting party via Epic fax function and remove from pre-op pool.      Chest discomfort-reports chronic chest heaviness, weakness and dyspnea.  Echocardiogram reassuring.  Previously noted to be precordial in nature. Patient reassured and echocardiogram reviewed   Disposition: Follow-up with Dr. Revankar or me in 3-4 months.        CC:  Carleen Chary, DO

## 2023-03-14 ENCOUNTER — Encounter: Payer: Self-pay | Admitting: Nurse Practitioner

## 2023-03-14 ENCOUNTER — Telehealth: Payer: Self-pay

## 2023-03-14 ENCOUNTER — Ambulatory Visit: Payer: Medicaid Other | Admitting: Nurse Practitioner

## 2023-03-14 ENCOUNTER — Other Ambulatory Visit: Payer: Self-pay

## 2023-03-14 ENCOUNTER — Other Ambulatory Visit (INDEPENDENT_AMBULATORY_CARE_PROVIDER_SITE_OTHER): Payer: Medicaid Other

## 2023-03-14 VITALS — Ht 61.75 in | Wt 194.2 lb

## 2023-03-14 DIAGNOSIS — A048 Other specified bacterial intestinal infections: Secondary | ICD-10-CM

## 2023-03-14 DIAGNOSIS — Z8679 Personal history of other diseases of the circulatory system: Secondary | ICD-10-CM

## 2023-03-14 DIAGNOSIS — Z1211 Encounter for screening for malignant neoplasm of colon: Secondary | ICD-10-CM | POA: Diagnosis not present

## 2023-03-14 DIAGNOSIS — Z8619 Personal history of other infectious and parasitic diseases: Secondary | ICD-10-CM

## 2023-03-14 LAB — CBC WITH DIFFERENTIAL/PLATELET
Basophils Absolute: 0.1 10*3/uL (ref 0.0–0.1)
Basophils Relative: 1.4 % (ref 0.0–3.0)
Eosinophils Absolute: 0.2 10*3/uL (ref 0.0–0.7)
Eosinophils Relative: 2.8 % (ref 0.0–5.0)
HCT: 36.7 % (ref 36.0–46.0)
Hemoglobin: 12.1 g/dL (ref 12.0–15.0)
Lymphocytes Relative: 30.1 % (ref 12.0–46.0)
Lymphs Abs: 2.3 10*3/uL (ref 0.7–4.0)
MCHC: 33 g/dL (ref 30.0–36.0)
MCV: 85.1 fL (ref 78.0–100.0)
Monocytes Absolute: 0.7 10*3/uL (ref 0.1–1.0)
Monocytes Relative: 8.4 % (ref 3.0–12.0)
Neutro Abs: 4.5 10*3/uL (ref 1.4–7.7)
Neutrophils Relative %: 57.3 % (ref 43.0–77.0)
Platelets: 229 10*3/uL (ref 150.0–400.0)
RBC: 4.31 Mil/uL (ref 3.87–5.11)
RDW: 14.3 % (ref 11.5–15.5)
WBC: 7.8 10*3/uL (ref 4.0–10.5)

## 2023-03-14 LAB — COMPREHENSIVE METABOLIC PANEL
ALT: 14 U/L (ref 0–35)
AST: 16 U/L (ref 0–37)
Albumin: 4.2 g/dL (ref 3.5–5.2)
Alkaline Phosphatase: 99 U/L (ref 39–117)
BUN: 14 mg/dL (ref 6–23)
CO2: 27 meq/L (ref 19–32)
Calcium: 9.3 mg/dL (ref 8.4–10.5)
Chloride: 108 meq/L (ref 96–112)
Creatinine, Ser: 0.48 mg/dL (ref 0.40–1.20)
GFR: 103.96 mL/min (ref >60.00)
Glucose, Bld: 111 mg/dL — ABNORMAL HIGH (ref 70–99)
Potassium: 3.8 meq/L (ref 3.5–5.1)
Sodium: 142 meq/L (ref 135–145)
Total Bilirubin: 0.5 mg/dL (ref 0.2–1.2)
Total Protein: 8.1 g/dL (ref 6.0–8.3)

## 2023-03-14 MED ORDER — OMEPRAZOLE 40 MG PO CPDR: 40.0000 mg | DELAYED_RELEASE_CAPSULE | Freq: Every day | ORAL | 1 refills | Status: DC

## 2023-03-14 MED ORDER — SUFLAVE 178.7 G PO SOLR: 1.0000 | ORAL | 0 refills | Status: DC

## 2023-03-14 NOTE — Telephone Encounter (Signed)
   Name: Abigail Butler  DOB: 08/20/1965  MRN: [id number]  Primary Cardiologist: Jennifer JONELLE Crape, MD   Preoperative team, please contact this patient and set up a phone call appointment for further preoperative risk assessment. Please obtain consent and complete medication review. Thank you for your help.  I confirm that guidance regarding antiplatelet and oral anticoagulation therapy has been completed and, if necessary, noted below. Patient is not on anticoagulation currently he is on ASA daily.   Per office protocol, if patient is without any new symptoms or concerns at the time of their virtual visit, he/she may hold ASA for 7 days prior to procedure. Please resume ASA as soon as possible postprocedure, at the discretion of the surgeon.    I also confirmed the patient resides in the state of [place] . As per [facility] Medical Board telemedicine laws, the patient must reside in the state in which the provider is licensed. Patient lives in [place], [place]    Lamarr Satterfield, [place] 03/14/2023, 3:11 PM [facility]

## 2023-03-14 NOTE — Telephone Encounter (Signed)
 [facility] [address] [place], [place]  [address] Phone:  [phone]   Fax:  [phone]   Abigail Butler DOB: 01/30/1966 MRN: [id number]  Dear: Dr. Edwyna :   The patient above is schedule for a Endoscopy & Colonoscopy in the near future under general anesthesia (Propofol ).    Please fax or route a note of Medical Clearance to [phone], Attn: Pebbles Zeiders, CMA  Please advise if this patient will require an office visit or further medical work-up before clearance can be given.   Thank you,   [facility]

## 2023-03-14 NOTE — Telephone Encounter (Signed)
 Tried to call the pt using [employer]. Pt was called and recording came on VM is full. We could note leave a message for the pt to call back to schedule a tele pre op appt.   Interpreter: [phone]

## 2023-03-14 NOTE — Patient Instructions (Addendum)
 You have been scheduled for an endoscopy and colonoscopy. Please follow the written instructions given to you at your visit today.  Please pick up your prep supplies at the pharmacy within the next 1-3 days.  If you use inhalers (even only as needed), please bring them with you on the day of your procedure. ____________________________________________ Your provider has requested that you go to the basement level for lab work before leaving today. Press B on the elevator. The lab is located at the first door on the left as you exit the elevator.  You will receive your bowel preparation through [facility], which ensures the lowest copay and home delivery, with outreach via text or call from an [phone] number. Please respond promptly to avoid rescheduling. If you are interested in alternative options or have any questions please contact them at [phone]  Your Provider Has Sent Your Bowel Prep Regimen To [facility] What to expect. [facility] will contact you to verify your information and collect your copay, if applicable. Enjoy the comfort of your home while we deliver your prescription to you, free of any shipping charges. Fast, FREE delivery or shipping. [facility] accepts all major insurance benefits and applies discounts & coupons  Have additional questions? [facility]'s patient care team is always here to help.  Chat: [email or web address] Call: [phone] Email: [email or web address] NCPDP: [id number] How will we contact you? Welcome Phone call  a Welcome text and a Checkout link in a text Texts you receive from [phone] Are Not Spam.   *To set up delivery, you must complete the checkout process via link or speak to one of our patient care representatives. If we are unable to reach you, your prescription may be delayed.   Due to recent changes in healthcare laws, you may see the results of your imaging and laboratory studies on MyChart before your provider has had a  chance to review them.  We understand that in some cases there may be results that are confusing or concerning to you. Not all laboratory results come back in the same time frame and the provider may be waiting for multiple results in order to interpret others.  Please give us  48 hours in order for your provider to thoroughly review all the results before contacting the office for clarification of your results.   Thank you for trusting me with your gastrointestinal care!   Elida Shawl, CRNP

## 2023-03-16 ENCOUNTER — Other Ambulatory Visit (HOSPITAL_BASED_OUTPATIENT_CLINIC_OR_DEPARTMENT_OTHER): Payer: Self-pay

## 2023-03-18 NOTE — Progress Notes (Signed)
Addendum: Reviewed and agree with assessment and management plan. Kadijah Shamoon M, MD  

## 2023-03-23 ENCOUNTER — Telehealth: Payer: Self-pay | Admitting: Nurse Practitioner

## 2023-03-23 NOTE — Telephone Encounter (Signed)
Contacted [facility]pre[facility] services. Interpreter attempted to contact patient and left a voicemail to return call.

## 2023-03-23 NOTE — Telephone Encounter (Signed)
Inbound call from patient's daughter requesting a call to discuss recent lab results. Please advise, thank you.

## 2023-03-25 ENCOUNTER — Telehealth: Payer: Self-pay | Admitting: *Deleted

## 2023-03-25 NOTE — Telephone Encounter (Signed)
  Patient Consent for Virtual Visit         Abigail Butler has provided verbal consent on 03/25/2023 for a virtual visit (video or telephone).   CONSENT FOR VIRTUAL VISIT FOR:  Abigail Butler  By participating in this virtual visit I agree to the following:  I hereby voluntarily request, consent and authorize [facility] and its employed or contracted physicians, physician assistants, nurse practitioners or other licensed health care professionals (the Practitioner), to provide me with telemedicine health care services (the "Services") as deemed necessary by the treating Practitioner. I acknowledge and consent to receive the Services by the Practitioner via telemedicine. I understand that the telemedicine visit will involve communicating with the Practitioner through live audiovisual communication technology and the disclosure of certain medical information by electronic transmission. I acknowledge that I have been given the opportunity to request an in-person assessment or other available alternative prior to the telemedicine visit and am voluntarily participating in the telemedicine visit.  I understand that I have the right to withhold or withdraw my consent to the use of telemedicine in the course of my care at any time, without affecting my right to future care or treatment, and that the Practitioner or I may terminate the telemedicine visit at any time. I understand that I have the right to inspect all information obtained and/or recorded in the course of the telemedicine visit and may receive copies of available information for a reasonable fee.  I understand that some of the potential risks of receiving the Services via telemedicine include:  Delay or interruption in medical evaluation due to technological equipment failure or disruption; Information transmitted may not be sufficient (e.g. poor resolution of images) to allow for appropriate medical decision making by the Practitioner;  and/or  In rare instances, security protocols could fail, causing a breach of personal health information.  Furthermore, I acknowledge that it is my responsibility to provide information about my medical history, conditions and care that is complete and accurate to the best of my ability. I acknowledge that Practitioner's advice, recommendations, and/or decision may be based on factors not within their control, such as incomplete or inaccurate data provided by me or distortions of diagnostic images or specimens that may result from electronic transmissions. I understand that the practice of medicine is not an exact science and that Practitioner makes no warranties or guarantees regarding treatment outcomes. I acknowledge that a copy of this consent can be made available to me via my patient portal [facility] MyChart), or I can request a printed copy by calling the office of  [facility].    I understand that my insurance will be billed for this visit.   I have read or had this consent read to me. I understand the contents of this consent, which adequately explains the benefits and risks of the Services being provided via telemedicine.  I have been provided ample opportunity to ask questions regarding this consent and the Services and have had my questions answered to my satisfaction. I give my informed consent for the services to be provided through the use of telemedicine in my medical care

## 2023-03-25 NOTE — Telephone Encounter (Signed)
Patient scheduled 04-2023

## 2023-04-07 ENCOUNTER — Ambulatory Visit: Payer: Medicaid Other | Attending: Cardiovascular Disease | Admitting: Cardiology

## 2023-04-07 ENCOUNTER — Other Ambulatory Visit (HOSPITAL_BASED_OUTPATIENT_CLINIC_OR_DEPARTMENT_OTHER): Payer: Self-pay

## 2023-04-07 DIAGNOSIS — Z01810 Encounter for preprocedural cardiovascular examination: Secondary | ICD-10-CM

## 2023-04-07 NOTE — Telephone Encounter (Signed)
 I will forward to [facility] scheduling team to see if they can reach out to the pt with appt in office per preop APP Katyln West, NP. Per NP today, pt stated she has had increased dizziness, lightheadedness and presyncope in the last 2 months. She will need an office visit for further evaluation prior to her procedure. Please see visit note from 04/07/23.    Pt will also need preop clearance.   Per pt request she wants to see Dr. Edwyna.

## 2023-04-07 NOTE — Progress Notes (Signed)
   Name:  Abigail Butler  DOB:  [birth year]  MRN:  [id number]   Primary Cardiologist: Jennifer JONELLE Crape, MD  Chart reviewed as part of pre-operative protocol coverage. Patient was contacted 04/07/2023 via interpretor services with interpretor [phone] in reference to pre-operative risk assessment for pending endoscopy and colonoscopy.  Abigail Butler was last seen on 11/11/2022 by Dr. Crape.  Today she reports increased dizziness, lightheadedness and presyncope. That started two months ago, she notes frequent dizziness, not specifically associated with position changes. She reports that she has not had syncope.  She also notes increased palpitations.  She denies chest pain or increased shortness of breath or lower extremity edema. She has not been monitoring her blood pressure or heart rate at home. Reviewed ED precautions with patient and daughter.   Due to new or worsening symptoms, Abigail Butler will require a follow-up visit for further pre-operative risk assessment.  Discussed with patient, she is in agreement and would prefer to see Dr. Crape.   Pre-op covering staff: - Please schedule appointment and call patient to inform them. If patient already had an upcoming appointment within acceptable timeframe, please add pre-op clearance to the appointment notes so provider is aware. - Please contact requesting surgeon's office via preferred method (i.e, phone, fax) to inform them of need for appointment prior to surgery.  Robyn Galati D Jojo Geving, NP 04/07/2023, 3:18 PM

## 2023-04-08 NOTE — Telephone Encounter (Signed)
 Called to schedule an appointment for patient, no voicemail available, unable to leave message/kbl 04/08/23

## 2023-04-11 NOTE — Telephone Encounter (Signed)
 Called to schedule appointment for patient/unable to leave message/kbl 04/11/23

## 2023-04-12 NOTE — Telephone Encounter (Signed)
Tried to call the pt back through [employer]. VM is full could not leave message to call back.

## 2023-04-12 NOTE — Telephone Encounter (Signed)
Patient is returning call and is requesting call back.

## 2023-04-14 NOTE — Telephone Encounter (Signed)
I am going to inform the requesting office that we have not been able to reach the pt.   Pt needs to call our office to schedule a televisit for preop clearance.

## 2023-04-21 ENCOUNTER — Ambulatory Visit: Payer: Medicaid Other | Admitting: Nurse Practitioner

## 2023-04-21 NOTE — Progress Notes (Deleted)
 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 Pylori who presents for follow-up related to mitral valve regurgitation, paroxysmal atrial fibrillation, dizziness, and for preoperative cardiac evaluation.    Past Medical History    Past Medical History:  Diagnosis Date   Cervical cancer screening 06/04/2021   Chronic left shoulder pain 03/23/2022   Daily headache    "sometimes; often" (02/10/2012)   Diabetes mellitus (HCC) 01/03/2023   Dysuria 10/26/2022   GERD (gastroesophageal reflux disease)    H. pylori infection    ?failed standard tx; currently undergoing levofloxacin salvage tx   H/O: CVA (cerebrovascular accident) 06/01/2013   Right thalamic CVA 2015   Health care maintenance 12/24/2013   Health care maintenance 12/24/2013   Hypertension 05/11/2017   Long term (current) use of anticoagulants 06/22/2021   Lower extremity edema 09/16/2022   Lumbar disc disease with radiculopathy 10/15/2014   Mass of left breast on mammogram 05/06/2015   Migraine without status migrainosus, not intractable 09/16/2022   Mixed dyslipidemia 11/12/2021   PAF (paroxysmal atrial fibrillation) (HCC) 11/12/2021   Prediabetes 10/15/2014   A1c 5.7 on 10/15/14   Right arm pain 10/15/2016   S/P Maze operation for atrial fibrillation 07/17/2021   S/P MVR (mitral valve replacement) 07/17/2021   TB lung, latent 2009   Treated in 2009 by [facility]   Thrombus of left atrial appendage 06/04/2021   Past Surgical History:  Procedure Laterality Date   DILATION AND CURETTAGE OF UTERUS  ~ 2008   "cleaned out  infection" (02/10/2012)   LEFT HEART CATH AND CORONARY ANGIOGRAPHY N/A 04/19/2017   Procedure: LEFT HEART CATH AND CORONARY ANGIOGRAPHY;  Surgeon: Swaziland, Peter M, MD;  Location: [facility] INVASIVE CV LAB;  Service: Cardiovascular;  Laterality: N/A;   MAZE N/A 07/17/2021   Procedure: MAZE;  Surgeon: Alleen Borne, MD;  Location: [facility] OR;  Service: Open Heart Surgery;  Laterality: N/A;   MITRAL VALVE REPLACEMENT N/A 07/17/2021   Procedure: MITRAL VALVE REPLACEMENT USING 27 MM MITRIS VALVE.  CRYO AND RADIOFREQUENCY MAZE PROCEDURE. LIGATION OF LEFT ATRIAL APPENDAGE.;  Surgeon: Alleen Borne, MD;  Location: [facility] OR;  Service: Open Heart Surgery;  Laterality: N/A;   TEE WITHOUT CARDIOVERSION N/A 05/27/2021   Procedure: TRANSESOPHAGEAL ECHOCARDIOGRAM (TEE);  Surgeon: [name], MD;  Location: [facility] ENDOSCOPY;  Service: Cardiovascular;  Laterality: N/A;   TEE WITHOUT CARDIOVERSION N/A 07/17/2021   Procedure: TRANSESOPHAGEAL ECHOCARDIOGRAM (TEE);  Surgeon: Alleen Borne, MD;  Location: [facility] OR;  Service: Open Heart Surgery;  Laterality: N/A;    Allergies  Allergies  Allergen Reactions   Pork-Derived Products Other (See Comments)    Religous belief     Labs/Other Studies Reviewed    The following studies were reviewed today:  Cardiac Studies & Procedures   ______________________________________________________________________________________________ CARDIAC CATHETERIZATION  CARDIAC CATHETERIZATION 04/19/2017  Narrative  The left ventricular systolic function is normal.  LV end diastolic pressure is normal.  The left ventricular ejection fraction is 55-65% by visual estimate.  1. Normal coronary anatomy 2. Normal LV function 3. Normal LVEDP  Plan: risk factor modification  Findings Coronary Findings Diagnostic  Dominance: Right  Left  Main Vessel was injected. Vessel is normal in caliber. Vessel is angiographically normal.  Left Anterior Descending Vessel was injected. Vessel  is normal in caliber. Vessel is angiographically normal.  Left Circumflex Vessel was injected. Vessel is normal in caliber. Vessel is angiographically normal.  Right Coronary Artery Vessel was injected. Vessel is normal in caliber. Vessel is angiographically normal.  Intervention  No interventions have been documented.     ECHOCARDIOGRAM  ECHOCARDIOGRAM COMPLETE 12/07/2022  Narrative ECHOCARDIOGRAM REPORT    Patient Name:   [name] Date of Exam: 12/07/2022 Medical Rec #:  [id number]    Height:       65.0 in Accession #:    [id number]   Weight:       193.0 lb Date of Birth:  Apr 05, 1965     BSA:          1.948 m Patient Age:    57 years     BP:           148/60 mmHg Patient Gender: F            HR:           59 bpm. Exam Location:  [place]  Procedure: 2D Echo, Cardiac Doppler and Color Doppler  Indications:    PAF (paroxysmal atrial fibrillation) (HCC) [I48.0 (ICD-10-CM)]  History:        Patient has prior history of Echocardiogram examinations, most recent 08/28/2021. Maze, Stroke, Mitral Valve Disease, Arrythmias:Atrial Fibrillation and Atrial Flutter, Signs/Symptoms:Chest Pain; Risk Factors:Hypertension, Dyslipidemia and Non-Smoker.  Sonographer:    Jake Seats RDMS, RVT, RDCS Referring Phys: Rito Ehrlich [facility]  IMPRESSIONS   1. Left ventricular ejection fraction, by estimation, is 55 to 60%. The left ventricle has normal function. The left ventricle has no regional wall motion abnormalities. Indeterminate diastolic filling due to E-A fusion. 2. Right ventricular systolic function is mildly reduced. The right ventricular size is mildly enlarged. There is normal pulmonary artery systolic pressure. 3. Left atrial size was severely dilated. 4. Right atrial size was moderately dilated. 5. The mitral valve has been repaired/replaced. Bioprosthetic valve (27mm Edwards) well seated, leaflets poorly visualized. No evidence of mitral valve regurgitation. The mean  mitral valve gradient is 5.0 mmHg with average heart rate of 56 bpm. MV-VTI/LVOT-VTI 1.8. 6. The aortic valve is tricuspid. Aortic valve regurgitation is mild. No aortic stenosis is present. 7. The inferior vena cava is normal in size with greater than 50% respiratory variability, suggesting right atrial pressure of 3 mmHg.  Comparison(s): A prior study was performed on 08/28/2021. Echocardiogram done 08/28/21 showed an EF of 55-60%, RVSP was 45mm Hg, MV prosthetic mean gradient was similar and Aortic insufficiency was trace.  FINDINGS Left Ventricle: Left ventricular ejection fraction, by estimation, is 55 to 60%. The left ventricle has normal function. The left ventricle has no regional wall motion abnormalities. The left ventricular internal cavity size was normal in size. There is no left ventricular hypertrophy. Indeterminate diastolic filling due to E-A fusion.  Right Ventricle: The right ventricular size is mildly enlarged. Right vetricular wall thickness was not well visualized. Right ventricular systolic function is mildly reduced. There is normal pulmonary artery systolic pressure. The tricuspid regurgitant velocity is 2.71 m/s, and with an assumed right atrial pressure of 3 mmHg, the estimated right ventricular systolic pressure is 32.4 mmHg.  Left Atrium: Left atrial size was severely dilated.  Right Atrium: Right atrial size was moderately dilated.  Pericardium: There is no evidence of pericardial effusion.  Mitral Valve: The mitral valve was not well visualized. No evidence of mitral valve regurgitation. There is a 27 mm Edwards-Mitris bioprosthetic valve present in the mitral position. MV peak gradient, 20.2 mmHg. The mean mitral valve gradient is 5.0 mmHg with average heart rate of 56 bpm.  Tricuspid Valve: The tricuspid valve is not well visualized. Tricuspid valve regurgitation is mild.  Aortic Valve: The aortic valve is tricuspid. Aortic valve regurgitation is mild. Aortic  regurgitation PHT measures 1299 msec. No aortic stenosis is present. Aortic valve mean gradient measures 5.0 mmHg. Aortic valve peak gradient measures 8.3 mmHg. Aortic valve area, by VTI measures 1.90 cm.  Pulmonic Valve: The pulmonic valve was not well visualized. Pulmonic valve regurgitation is trivial.  Aorta: The aortic root and ascending aorta are structurally normal, with no evidence of dilitation.  Venous: The inferior vena cava is normal in size with greater than 50% respiratory variability, suggesting right atrial pressure of 3 mmHg.  IAS/Shunts: The interatrial septum was not well visualized.   LEFT VENTRICLE PLAX 2D LVIDd:         5.00 cm     Diastology LVIDs:         3.10 cm     LV e' medial:    3.64 cm/s LV PW:         1.30 cm     LV E/e' medial:  49.5 LV IVS:        0.90 cm     LV e' lateral:   5.15 cm/s LVOT diam:     1.70 cm     LV E/e' lateral: 35.0 LV SV:         71 LV SV Index:   36 LVOT Area:     2.27 cm  LV Volumes (MOD) LV vol d, MOD A2C: 91.9 ml LV vol d, MOD A4C: 87.6 ml LV vol s, MOD A2C: 38.0 ml LV vol s, MOD A4C: 37.6 ml LV SV MOD A2C:     53.9 ml LV SV MOD A4C:     87.6 ml LV SV MOD BP:      56.0 ml  RIGHT VENTRICLE RV S prime:     7.94 cm/s TAPSE (M-mode): 1.0 cm  LEFT ATRIUM              Index        RIGHT ATRIUM           Index LA diam:        4.70 cm  2.41 cm/m   RA Area:     20.90 cm LA Vol (A2C):   83.1 ml  42.65 ml/m  RA Volume:   64.10 ml  32.90 ml/m LA Vol (A4C):   115.0 ml 59.02 ml/m LA Biplane Vol: 105.0 ml 53.89 ml/m AORTIC VALVE                     PULMONIC VALVE AV Area (Vmax):    2.00 cm      PR End Diast Vel: 4.75 msec AV Area (Vmean):   1.89 cm AV Area (VTI):     1.90 cm AV Vmax:           144.00 cm/s AV Vmean:          103.000 cm/s AV VTI:            0.374 m AV Peak Grad:      8.3 mmHg AV Mean Grad:      5.0 mmHg LVOT  Vmax:         127.00 cm/s LVOT Vmean:        85.800 cm/s LVOT VTI:          0.313 m LVOT/AV  VTI ratio: 0.84 AI PHT:            1299 msec AR Vena Contracta: 0.30 cm  AORTA Ao Root diam: 2.90 cm Ao Asc diam:  3.30 cm  MITRAL VALVE                TRICUSPID VALVE MV Area (PHT): 2.84 cm     TR Peak grad:   29.4 mmHg MV Area VTI:   1.22 cm     TR Vmax:        271.00 cm/s MV Peak grad:  20.2 mmHg MV Mean grad:  5.0 mmHg     SHUNTS MV Vmax:       2.25 m/s     Systemic VTI:  0.31 m MV Vmean:      97.7 cm/s    Systemic Diam: 1.70 cm MV Decel Time: 267 msec MV E velocity: 180.00 cm/s MV A velocity: 20.80 cm/s MV E/A ratio:  8.65  Sreedhar reddy Madireddy Electronically signed by Vern Claude reddy Madireddy Signature Date/Time: 12/07/2022/2:36:43 PM    Final   TEE  ECHO INTRAOPERATIVE TEE 07/17/2021  Narrative *INTRAOPERATIVE TRANSESOPHAGEAL REPORT *    Patient Name:   QUYNN VILCHIS Date of Exam: 07/17/2021 Medical Rec #:  [id number]    Height:       64.0 in Accession #:    [id number]   Weight:       182.4 lb Date of Birth:  January 20, 1966     BSA:          1.88 m Patient Age:    56 years     BP:           102/66 mmHg Patient Gender: F            HR:           67 bpm. Exam Location:  Inpatient  Transesophogeal exam was perform intraoperatively during surgical procedure. Patient was closely monitored under general anesthesia during the entirety of examination.  Indications:     Mitral valve replacement Maze procedure Performing Phys: [id number] BARTLE Diagnosing Phys: Marcene Duos MD  Complications: No known complications during this procedure. POST-OP IMPRESSIONS _ Left Ventricle: The left ventricle is unchanged from pre-bypass. _ Right Ventricle: The right ventricle appears unchanged from pre-bypass. _ Aorta: The aorta appears unchanged from pre-bypass. _ Left Atrium: The left atrium appears unchanged from pre-bypass. _ Aortic Valve: The aortic valve appears unchanged from pre-bypass. _ Mitral Valve: A bioprosthetic bioprosthetic valve was placed.No  perivalvular leak. Expected post op pressure gradients. Leaflets are thin and freely mobile. _ Tricuspid Valve: The tricuspid valve appears unchanged from pre-bypass. _ Pulmonic Valve: The pulmonic valve appears unchanged from pre-bypass. _ Interatrial Septum: The interatrial septum appears unchanged from pre-bypass. _ Interventricular Septum: The interventricular septum appears unchanged from pre-bypass. _ Pericardium: The pericardium appears unchanged from pre-bypass.  PRE-OP FINDINGS Left Ventricle: The left ventricle has normal systolic function, with an ejection fraction of 55-60%. The cavity size was mildly dilated.   Right Ventricle: The right ventricle has normal systolic function. The cavity was normal. There is no increase in right ventricular wall thickness.  Left Atrium: Left atrial size was dilated. There is echo contrast seen in the left atrial cavity and left atrial appendage.  The left atrial appendage is well visualized and there is no evidence of thrombus present.  Right Atrium: Right atrial size was normal in size.  Interatrial Septum: No atrial level shunt detected by color flow Doppler.  Pericardium: There is no evidence of pericardial effusion.  Mitral Valve: The mitral valve is rheumatic. Mitral valve regurgitation is trivial by color flow Doppler. There is Pressure gradient mean of consistent with mild stenosis, however pt under general anesthesia at time of evaluation changing loading conditions. Valve is rheumatic with restricted opening. mitral stenosis.  Tricuspid Valve: The tricuspid valve was normal in structure. Tricuspid valve regurgitation is moderate by color flow Doppler. No evidence of tricuspid stenosis is present.  Aortic Valve: The aortic valve is tricuspid Aortic valve regurgitation is mild by color flow Doppler. There is no stenosis of the aortic valve.   Pulmonic Valve: The pulmonic valve was normal in structure. Pulmonic valve  regurgitation is not visualized by color flow Doppler.   Aorta: The aortic root, ascending aorta and aortic arch are normal in size and structure.  +-------------+-----------++ AORTIC VALVE             +-------------+-----------++ AV Vmax:     107.00 cm/s +-------------+-----------++ AV Vmean:    65.300 cm/s +-------------+-----------++ AV VTI:      0.254 m     +-------------+-----------++ AV Peak Grad:4.6 mmHg    +-------------+-----------++ AV Mean Grad:2.0 mmHg    +-------------+-----------++ AR PHT:      178 msec    +-------------+-----------++  +-------------+-------++ AORTA                +-------------+-------++ Ao Root diam:2.70 cm +-------------+-------++ Ao Asc diam: 3.00 cm +-------------+-------++  +-------------+---------++ +---------------+-----------++ MITRAL VALVE           TRICUSPID VALVE            +-------------+---------++ +---------------+-----------++ MV Peak grad:9.7 mmHg  TR Peak grad:  76.0 mmHg   +-------------+---------++ +---------------+-----------++ MV Mean grad:3.5 mmHg  TR Vmax:       436.00 cm/s +-------------+---------++ +---------------+-----------++ MV Vmax:     1.56 m/s  +-------------+---------++ MV Vmean:    87.4 cm/s +-------------+---------++ MV VTI:      0.44 m    +-------------+---------++   Marcene Duos MD Electronically signed by Marcene Duos MD Signature Date/Time: 07/21/2021/1:19:05 PM    Final  MONITORS  CARDIAC EVENT MONITOR 12/17/2021  Narrative Patient underwent event monitoring for 1 month. During this time patient was found to have sustained episodes of paroxysmal atrial fibrillation.  During this time heart rates were unremarkable       ______________________________________________________________________________________________     Recent Labs: 03/14/2023: ALT 14; BUN 14; Creatinine, Ser 0.48; Hemoglobin 12.1;  Platelets 229.0; Potassium 3.8; Sodium 142  Recent Lipid Panel    Component Value Date/Time   CHOL 177 06/04/2021 0949   TRIG 173 (H) 06/04/2021 0949   HDL 32 (L) 06/04/2021 0949   CHOLHDL 5.5 (H) 06/04/2021 0949   CHOLHDL 5.3 02/10/2012 0520   VLDL 24 02/10/2012 0520   LDLCALC 114 (H) 06/04/2021 0949    History of Present Illness    58 year old female with the above past medical history including 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 pylori.  Cardiac catheterization in 2019 revealed normal coronary arteries.  She has a history of stroke.  She has a history of severe rheumatic mitral valve stenosis  s/p mitral valve replacement in 2023 as well as MAZE procedure for atrial fibrillation.  Cardiac monitor in 11/2021 revealed paroxysmal atrial fibrillation.  She was last seen in the office on 11/11/2022 and was doing well from a cardiac standpoint.  Most recent echocardiogram 11/2022 showed EF 55 to 60%, normal LV function, no RWMA, mild the reduced RV systolic function, normal PASP, severely dilated left atrium, moderately dilated right atrium, stable bioprosthetic mitral valve, no evidence of mitral valve regurgitation or stenosis, mild aortic valve regurgitation.  She was contacted by phone on 04/07/2023 for preoperative risk assessment and reported a two month history of increased dizziness, lightheadedness, presyncope.  An office visit was recommended.  She presents today for follow-up and for preoperative cardiac evaluation for upcoming endoscopy/colonoscopy. Since her last visit  Dizziness: Mitral valve stenosis: Paroxysmal atrial fibrillation: History of CVA: Hypertension: Hyperlipidemia: Type 2 diabetes: Preoperative cardiac exam: Disposition:  Home Medications    Current Outpatient Medications  Medication Sig Dispense  Refill   acetaminophen (TYLENOL) 500 MG tablet Take 500-1,000 mg by mouth as needed.     aspirin 81 MG EC tablet Take 1 tablet (81 mg total) by mouth daily. Swallow whole. 150 tablet 2   atorvastatin (LIPITOR) 40 MG tablet Take 1 tablet (40 mg total) by mouth daily. 90 tablet 2   metoprolol tartrate (LOPRESSOR) 25 MG tablet Take 1 tablet (25 mg total) by mouth 2 (two) times daily. 180 tablet 3   No current facility-administered medications for this visit.     Review of Systems    ***.  All other systems reviewed and are otherwise negative except as noted above.    Physical Exam    VS:  There were no vitals taken for this visit. , BMI There is no height or weight on file to calculate BMI.     GEN: Well nourished, well developed, in no acute distress. HEENT: normal. Neck: Supple, no JVD, carotid bruits, or masses. Cardiac: RRR, no murmurs, rubs, or gallops. No clubbing, cyanosis, edema.  Radials/DP/PT 2+ and equal bilaterally.  Respiratory:  Respirations regular and unlabored, clear to auscultation bilaterally. GI: Soft, nontender, nondistended, BS + x 4. MS: no deformity or atrophy. Skin: warm and dry, no rash. Neuro:  Strength and sensation are intact. Psych: Normal affect.  Accessory Clinical Findings    ECG personally reviewed by me today -    - no acute changes.   Lab Results  Component Value Date   WBC 7.8 03/14/2023   HGB 12.1 03/14/2023   HCT 36.7 03/14/2023   MCV 85.1 03/14/2023   PLT 229.0 03/14/2023   Lab Results  Component Value Date   CREATININE 0.48 03/14/2023   BUN 14 03/14/2023   NA 142 03/14/2023   K 3.8 03/14/2023   CL 108 03/14/2023   CO2 27 03/14/2023   Lab Results  Component Value Date   ALT 14 03/14/2023   AST 16 03/14/2023   ALKPHOS 99 03/14/2023   BILITOT 0.5 03/14/2023   Lab Results  Component Value Date   CHOL 177 06/04/2021   HDL 32 (L) 06/04/2021   LDLCALC 114 (H) 06/04/2021   TRIG 173 (H) 06/04/2021   CHOLHDL 5.5 (H) 06/04/2021     Lab Results  Component Value Date   HGBA1C 6.8 (A) 01/03/2023    Assessment & Plan    1.  ***  No BP recorded.  {Refresh Note OR Click here to enter BP  :1}***   Joylene Grapes, NP 04/21/2023,  8:20 AM

## 2023-04-27 NOTE — Progress Notes (Unsigned)
 Cardiology Clinic Note   Patient Name: Abigail Butler Date of Encounter: 04/29/2023  Primary Care Provider:  Katheran James, DO Primary Cardiologist:  Garwin Brothers, MD  Patient Profile    Abigail Butler 58 year old female presents to the clinic today for follow-up evaluation of her hypertension and paroxysmal atrial fibrillation as well as preoperative cardiac evaluation.    Past Medical History    Past Medical History:  Diagnosis Date   Cervical cancer screening 06/04/2021   Chronic left shoulder pain 03/23/2022   Daily headache    "sometimes; often" (02/10/2012)   Diabetes mellitus (HCC) 01/03/2023   Dysuria 10/26/2022   GERD (gastroesophageal reflux disease)    H. pylori infection    ?failed standard tx; currently undergoing levofloxacin salvage tx   H/O: CVA (cerebrovascular accident) 06/01/2013   Right thalamic CVA 2015   Health care maintenance 12/24/2013   Health care maintenance 12/24/2013   Hypertension 05/11/2017   Long term (current) use of anticoagulants 06/22/2021   Lower extremity edema 09/16/2022   Lumbar disc disease with radiculopathy 10/15/2014   Mass of left breast on mammogram 05/06/2015   Migraine without status migrainosus, not intractable 09/16/2022   Mixed dyslipidemia 11/12/2021   PAF (paroxysmal atrial fibrillation) (HCC) 11/12/2021   Prediabetes 10/15/2014   A1c 5.7 on 10/15/14   Right arm pain 10/15/2016   S/P Maze operation for atrial fibrillation 07/17/2021   S/P MVR (mitral valve replacement) 07/17/2021   TB lung, latent 2009   Treated in 2009 by [facility]   Thrombus of left atrial appendage 06/04/2021   Past Surgical History:  Procedure Laterality Date   DILATION AND CURETTAGE OF UTERUS  ~ 2008   "cleaned out infection" (02/10/2012)   LEFT HEART CATH AND CORONARY ANGIOGRAPHY N/A 04/19/2017   Procedure: LEFT HEART CATH AND CORONARY ANGIOGRAPHY;  Surgeon: Swazil[place], Peter M, MD;  Location: [facility] INVASIVE CV LAB;   Service: Cardiovascular;  Laterality: N/A;   MAZE N/A 07/17/2021   Procedure: MAZE;  Surgeon: Alleen Borne, MD;  Location: [facility] OR;  Service: Open Heart Surgery;  Laterality: N/A;   MITRAL VALVE REPLACEMENT N/A 07/17/2021   Procedure: MITRAL VALVE REPLACEMENT USING 27 MM MITRIS VALVE.  CRYO AND RADIOFREQUENCY MAZE PROCEDURE. LIGATION OF LEFT ATRIAL APPENDAGE.;  Surgeon: Alleen Borne, MD;  Location: [facility] OR;  Service: Open Heart Surgery;  Laterality: N/A;   TEE WITHOUT CARDIOVERSION N/A 05/27/2021   Procedure: TRANSESOPHAGEAL ECHOCARDIOGRAM (TEE);  Surgeon: [name], MD;  Location: [facility] ENDOSCOPY;  Service: Cardiovascular;  Laterality: N/A;   TEE WITHOUT CARDIOVERSION N/A 07/17/2021   Procedure: TRANSESOPHAGEAL ECHOCARDIOGRAM (TEE);  Surgeon: Alleen Borne, MD;  Location: [facility] OR;  Service: Open Heart Surgery;  Laterality: N/A;    Allergies  Allergies  Allergen Reactions   Pork-Derived Products Other (See Comments)    Religous belief    History of Present Illness    Abigail Butler has a PMH of HTN, paroxysmal atrial fibrillation status post maze procedure, CVA, HLD, and is status post mitral valve replacement.  She also underwent left atrial appendage clipping.  Echocardiogram 12/07/2022 showed LVEF of 55-60%, intermediate diastolic filling pressures severely dilated left atria, moderately dilated right atria, repaired/replaced mitral valve with no evidence of mitral valve regurgitation.  Her aortic valve showed mild regurgitation.  She was seen in follow-up by Dr. Tomie China 11/11/2022.  During that time she was doing well.  She denied cardiac issues.  She was completing her activities of daily living.  She was walking half an hour a day on regular basis without symptoms.  She presents to the clinic today for follow-up evaluation and preoperative cardiac evaluation.  She states she has noticed chest discomfort, palpitations, and is concerned about her upcoming colonoscopy/endoscopy.  We  reviewed her prior surgeries and current symptoms.  She notes that her palpitations have been more so in the evening.  She has noticed elevated blood pressure over the last several months.  She reports that she has taken her blood pressure medication this morning.  She has been following a low-sodium diet.  She is fairly physically active.  On exam she is tender to palpation.  I reviewed that her pain is musculoskeletal in nature.  She asks about being able to obtain disability.  I expressed concern about her blood pressure and her palpitations.  I will order a 14-day cardiac event monitor, CBC, CMP, magnesium, increase her metoprolol and plan follow-up in 6 to 8 weeks.  I explained that she would need to follow-up with her PCP for evaluation of disability.  She presents with her daughter and interpreter.      Home Medications    Prior to Admission medications   Medication Sig Start Date End Date Taking? Authorizing Provider  acetaminophen (TYLENOL) 500 MG tablet Take 500-1,000 mg by mouth as needed.    [provider]  aspirin 81 MG EC tablet Take 1 tablet (81 mg total) by mouth daily. Swallow whole. 06/04/21   Gardenia Phlegm, MD  atorvastatin (LIPITOR) 40 MG tablet Take 1 tablet (40 mg total) by mouth daily. 01/06/23   Revankar, Aundra Dubin, MD  metoprolol tartrate (LOPRESSOR) 25 MG tablet Take 1 tablet (25 mg total) by mouth 2 (two) times daily. 01/05/23   Revankar, Aundra Dubin, MD    Family History    Family History  Problem Relation Age of Onset   Colon cancer Neg Hx    Colon polyps Neg Hx    Esophageal cancer Neg Hx    Rectal cancer Neg Hx    Stomach cancer Neg Hx    She indicated that the status of her neg hx is unknown.  Social History    Social History   Socioeconomic History   Marital status: Married    Spouse name: Not on file   Number of children: 3   Years of education: Not on file   Highest education level: Not on file  Occupational History   Occupation: Homemaker   Tobacco Use   Smoking status: Never    Passive exposure: Never   Smokeless tobacco: Never  Vaping Use   Vaping status: Never Used  Substance and Sexual Activity   Alcohol use: No    Alcohol/week: 0.0 standard drinks of alcohol   Drug use: No   Sexual activity: Not on file  Other Topics Concern   Not on file  Social History Narrative   ** Merged History Encounter **       ** Merged History Encounter **       Social Drivers of Corporate investment banker Strain: Not on file  Food Insecurity: Not on file  Transportation Needs: Not on file  Physical Activity: Not on file  Stress: Not on file  Social Connections: Not on file  Intimate Partner Violence: Not on file     Review of Systems    General:  No chills, fever, night sweats or weight changes.  Cardiovascular:  No chest pain, dyspnea on exertion, edema, orthopnea,  palpitations, paroxysmal nocturnal dyspnea. Dermatological: No rash, lesions/masses Respiratory: No cough, dyspnea Urologic: No hematuria, dysuria Abdominal:   No nausea, vomiting, diarrhea, bright red blood per rectum, melena, or hematemesis Neurologic:  No visual changes, wkns, changes in mental status. All other systems reviewed and are otherwise negative except as noted above.  Physical Exam    VS:  BP (!) 162/60   Pulse 61   Ht 5\' 4"  (1.626 m)   Wt 194 lb (88 kg)   SpO2 98%   BMI 33.30 kg/m  , BMI Body mass index is 33.3 kg/m. GEN: Well nourished, well developed, in no acute distress. HEENT: normal. Neck: Supple, no JVD, carotid bruits, or masses. Cardiac: RRR, no murmurs, rubs, or gallops. No clubbing, cyanosis, ankle edema.  Chest pain + radials/DP/PT 2+ and equal bilaterally.  Respiratory:  Respirations regular and unlabored, clear to auscultation bilaterally. GI: Soft, nontender, nondistended, BS + x 4. MS: no deformity or atrophy. Skin: warm and dry, no rash. Neuro:  Strength and sensation are intact. Psych: Normal affect.  Accessory  Clinical Findings    Recent Labs: 03/14/2023: ALT 14; BUN 14; Creatinine, Ser 0.48; Hemoglobin 12.1; Platelets 229.0; Potassium 3.8; Sodium 142   Recent Lipid Panel    Component Value Date/Time   CHOL 177 06/04/2021 0949   TRIG 173 (H) 06/04/2021 0949   HDL 32 (L) 06/04/2021 0949   CHOLHDL 5.5 (H) 06/04/2021 0949   CHOLHDL 5.3 02/10/2012 0520   VLDL 24 02/10/2012 0520   LDLCALC 114 (H) 06/04/2021 0949    HYPERTENSION CONTROL Vitals:   04/29/23 0911 04/29/23 0945  BP: (!) 178/74 (!) 162/60    The patient's blood pressure is elevated above target today.  In order to address the patient's elevated BP: Blood pressure will be monitored at home to determine if medication changes need to be made.; A current anti-hypertensive medication was adjusted today.       ECG personally reviewed by me today- EKG Interpretation Date/Time:  Friday April 29 2023 09:08:08 EST Ventricular Rate:  60 PR Interval:  162 QRS Duration:  86 QT Interval:  450 QTC Calculation: 450 R Axis:   -8  Text Interpretation: Normal sinus rhythm Left ventricular hypertrophy with repolarization abnormality ( R in aVL , Cornell product ) Confirmed by Edd Fabian [phone]) on 04/29/2023 9:09:41 AM   Echocardiogram 12/07/2022  IMPRESSIONS     1. Left ventricular ejection fraction, by estimation, is 55 to 60%. The  left ventricle has normal function. The left ventricle has no regional  wall motion abnormalities. Indeterminate diastolic filling due to E-A  fusion.   2. Right ventricular systolic function is mildly reduced. The right  ventricular size is mildly enlarged. There is normal pulmonary artery  systolic pressure.   3. Left atrial size was severely dilated.   4. Right atrial size was moderately dilated.   5. The mitral valve has been repaired/replaced. Bioprosthetic valve (27mm  Edwards) well seated, leaflets poorly visualized. No evidence of mitral  valve regurgitation. The mean mitral valve  gradient is 5.0 mmHg with  average heart rate of 56 bpm.  MV-VTI/LVOT-VTI 1.8.   6. The aortic valve is tricuspid. Aortic valve regurgitation is mild. No  aortic stenosis is present.   7. The inferior vena cava is normal in size with greater than 50%  respiratory variability, suggesting right atrial pressure of 3 mmHg.   Comparison(s): A prior study was performed on 08/28/2021. Echocardiogram  done 08/28/21 showed an EF of 55-60%, RVSP  was 45mm Hg, MV prosthetic mean  gradient was similar and Aortic insufficiency was trace.   FINDINGS   Left Ventricle: Left ventricular ejection fraction, by estimation, is 55  to 60%. The left ventricle has normal function. The left ventricle has no  regional wall motion abnormalities. The left ventricular internal cavity  size was normal in size. There is   no left ventricular hypertrophy. Indeterminate diastolic filling due to  E-A fusion.   Right Ventricle: The right ventricular size is mildly enlarged. Right  vetricular wall thickness was not well visualized. Right ventricular  systolic function is mildly reduced. There is normal pulmonary artery  systolic pressure. The tricuspid regurgitant  velocity is 2.71 m/s, and with an assumed right atrial pressure of 3 mmHg,  the estimated right ventricular systolic pressure is 32.4 mmHg.   Left Atrium: Left atrial size was severely dilated.   Right Atrium: Right atrial size was moderately dilated.   Pericardium: There is no evidence of pericardial effusion.   Mitral Valve: The mitral valve was not well visualized. No evidence of  mitral valve regurgitation. There is a 27 mm Edwards-Mitris bioprosthetic  valve present in the mitral position. MV peak gradient, 20.2 mmHg. The  mean mitral valve gradient is 5.0 mmHg   with average heart rate of 56 bpm.   Tricuspid Valve: The tricuspid valve is not well visualized. Tricuspid  valve regurgitation is mild.   Aortic Valve: The aortic valve is tricuspid.  Aortic valve regurgitation is  mild. Aortic regurgitation PHT measures 1299 msec. No aortic stenosis is  present. Aortic valve mean gradient measures 5.0 mmHg. Aortic valve peak  gradient measures 8.3 mmHg.  Aortic valve area, by VTI measures 1.90 cm.   Pulmonic Valve: The pulmonic valve was not well visualized. Pulmonic valve  regurgitation is trivial.   Aorta: The aortic root and ascending aorta are structurally normal, with  no evidence of dilitation.   Venous: The inferior vena cava is normal in size with greater than 50%  respiratory variability, suggesting right atrial pressure of 3 mmHg.   IAS/Shunts: The interatrial septum was not well visualized.        Assessment & Plan   1.  Palpitations-reports intermittent episodes of palpitations.  Noticing episodes at night.  Previous Maze procedure and LAA. Order cardiac event monitor-14-day Increase p.o. hydration Avoid triggers caffeine, chocolate, EtOH, dehydration etc.  Paroxysmal atrial fibrillation-EKG today shows Normal sinus rhythm.  Status post Maze procedure and LAA clipping.  Denies episodes of accelerated or irregular heartbeat. Continue aspirin, metoprolol Avoid triggers caffeine, chocolate, EtOH, dehydration etc. Maintain physical activity  Essential hypertension-BP today 162/60. Heart healthy low-sodium diet-salty 6 diet sheet given Maintain blood pressure log Increase Toprol to 37.5 twice daily Maintain blood pressure log-goal 130s over 80s  Hyperlipidemia-LDL 114 on 06/04/21. High-fiber diet-increase fiber  Increase physical activity Continue aspirin, atorvastatin  Status post mitral valve replacement-no increased DOE or activity intolerance.  Echocardiogram 12/07/2022 showed normal LVEF, intermediate diastolic filling pressures, severely dilated left atria, moderately dilated right atria, repaired/replaced mitral valve with well-seated bioprosthetic valve showing no evidence of regurgitation. Plan for  repeat echocardiogram 10/25.  History of CVA-neurologically intact. Follows with PCP  Preoperative cardiac evaluation-colonoscopy and endoscopy, [facility], fax #[phone]  Will defer procedure at this time due to palpitations and elevated blood pressure.  Chest discomfort-reproducible with deep palpation.  Explained that her discomfort is related to MSK versus nerve discomfort.  She asks about disability paperwork/qualifying for disability.  She feels limited  in her physical activity. Rest area Follow-up with PCP for further evaluation  Disposition: Follow-up with Dr. Tomie China or me in 6-8 weeks   Thomasene Ripple. Melane Windholz NP-C     04/29/2023, 9:53 AM [facility] [address] Office [phone] Fax [phone]    I spent 15 minutes examining this patient, reviewing medications, and using patient centered shared decision making involving their cardiac care.   I spent  20 minutes reviewing past medical history,  medications, and prior cardiac tests.

## 2023-04-29 ENCOUNTER — Ambulatory Visit: Payer: Medicaid Other | Attending: General Practice | Admitting: General Practice

## 2023-04-29 ENCOUNTER — Other Ambulatory Visit (HOSPITAL_BASED_OUTPATIENT_CLINIC_OR_DEPARTMENT_OTHER): Payer: Self-pay

## 2023-04-29 ENCOUNTER — Encounter: Payer: Self-pay | Admitting: General Practice

## 2023-04-29 ENCOUNTER — Ambulatory Visit: Payer: Medicaid Other | Attending: General Practice

## 2023-04-29 VITALS — BP 162/60 | HR 61 | Ht 64.0 in | Wt 194.0 lb

## 2023-04-29 DIAGNOSIS — R42 Dizziness and giddiness: Secondary | ICD-10-CM

## 2023-04-29 DIAGNOSIS — R002 Palpitations: Secondary | ICD-10-CM

## 2023-04-29 DIAGNOSIS — I10 Essential (primary) hypertension: Secondary | ICD-10-CM

## 2023-04-29 DIAGNOSIS — I480 Paroxysmal atrial fibrillation: Secondary | ICD-10-CM | POA: Diagnosis not present

## 2023-04-29 DIAGNOSIS — Z8673 Personal history of transient ischemic attack (TIA), and cerebral infarction without residual deficits: Secondary | ICD-10-CM

## 2023-04-29 DIAGNOSIS — E782 Mixed hyperlipidemia: Secondary | ICD-10-CM

## 2023-04-29 DIAGNOSIS — Z952 Presence of prosthetic heart valve: Secondary | ICD-10-CM

## 2023-04-29 LAB — CBC

## 2023-04-29 MED ORDER — METOPROLOL TARTRATE 25 MG PO TABS
37.8000 mg | ORAL_TABLET | Freq: Two times a day (BID) | ORAL | 1 refills | Status: DC
  Filled 2023-04-29: qty 18045, fill #0

## 2023-04-29 MED ORDER — METOPROLOL TARTRATE 25 MG PO TABS
37.8000 mg | ORAL_TABLET | Freq: Two times a day (BID) | ORAL | 1 refills | Status: DC
  Filled 2023-04-29: qty 90, 30d supply, fill #0
  Filled 2023-06-22: qty 90, 30d supply, fill #1

## 2023-04-29 NOTE — Patient Instructions (Addendum)
 Medication Instructions:  INCREASE METOPROLOL TART TO 37.5MG  TWICE DAILY *If you need a refill on your cardiac medications before your next appointment, please call your pharmacy*   Lab Work: CBC, CMET AND MAG TODAY If you have labs (blood work) drawn today and your tests are completely normal, you will receive your results only by:  MyChart Message (if you have MyChart) OR  A paper copy in the mail If you have any lab test that is abnormal or we need to change your treatment, we will call you to review the results.  Testing/Procedures: Your physician has requested you wear a ZIO patch monitor for 14 days.   Follow-Up: At [facility], you and your health needs are our priority.  As part of our continuing mission to provide you with exceptional heart care, we have created designated Provider Care Teams.  These Care Teams include your primary Cardiologist (physician) and Advanced Practice Providers (APPs -  Physician Assistants and Nurse Practitioners) who all work together to provide you with the care you need, when you need it.  We recommend signing up for the patient portal called "MyChart".  Sign up information is provided on this After Visit Summary.  MyChart is used to connect with patients for Virtual Visits (Telemedicine).  Patients are able to view lab/test results, encounter notes, upcoming appointments, etc.  Non-urgent messages can be sent to your provider as well.   To learn more about what you can do with MyChart, go to [email or web address].    Your next appointment:   6-8 week(s)  Provider:   Edd Fabian, FNP-C    Other Instructions DISCUSS DISABILITY WITH YOUR PRIMARY MD TAKE AND LOG YOUR BLOOD PRESSURE     ZIO XT- Long Term Monitor Instructions  Your physician has requested you wear a ZIO patch monitor for 14 days.  This is a single patch monitor. [employer] supplies one patch monitor per enrollment. Additional stickers are not available. Please do not  apply patch if you will be having a Nuclear Stress Test,  Echocardiogram, Cardiac CT, MRI, or Chest Xray during the period you would be wearing the  monitor. The patch cannot be worn during these tests. You cannot remove and re-apply the  ZIO XT patch monitor.  Your ZIO patch monitor will be mailed 3 day USPS to your address on file. It may take 3-5 days  to receive your monitor after you have been enrolled.  Once you have received your monitor, please review the enclosed instructions. Your monitor  has already been registered assigning a specific monitor serial # to you.  Billing and Patient Assistance Program Information  We have supplied [employer] with any of your insurance information on file for billing purposes. [employer] offers a sliding scale Patient Assistance Program for patients that do not have  insurance, or whose insurance does not completely cover the cost of the ZIO monitor.  You must apply for the Patient Assistance Program to qualify for this discounted rate.  To apply, please call [employer] at [phone], select option 4, select option 2, ask to apply for  Patient Assistance Program. Meredeth Ide will ask your household income, and how many people  are in your household. They will quote your out-of-pocket cost based on that information.  [employer] will also be able to set up a 41-month, interest-free payment plan if needed.  Applying the monitor   Shave hair from upper left chest.  Hold abrader disc by orange tab. Rub abrader in 40 strokes over the  upper left chest as  indicated in your monitor instructions.  Clean area with 4 enclosed alcohol pads. Let dry.  Apply patch as indicated in monitor instructions. Patch will be placed under collarbone on left  side of chest with arrow pointing upward.  Rub patch adhesive wings for 2 minutes. Remove white label marked "1". Remove the white  label marked "2". Rub patch adhesive wings for 2 additional minutes.  While looking in a mirror,  press and release button in center of patch. A small green light will  flash 3-4 times. This will be your only indicator that the monitor has been turned on.  Do not shower for the first 24 hours. You may shower after the first 24 hours.  Press the button if you feel a symptom. You will hear a small click. Record Date, Time and  Symptom in the Patient Logbook.  When you are ready to remove the patch, follow instructions on the last 2 pages of Patient  Logbook. Stick patch monitor onto the last page of Patient Logbook.  Place Patient Logbook in the blue and white box. Use locking tab on box and tape box closed  securely. The blue and white box has prepaid postage on it. Please place it in the mailbox as  soon as possible. Your physician should have your test results approximately 7 days after the  monitor has been mailed back to [facility].  Call [facility] Customer Care at [phone] if you have questions regarding  your ZIO XT patch monitor. Call them immediately if you see an orange light blinking on your  monitor.  If your monitor falls off in less than 4 days, contact our Monitor department at [phone].  If your monitor becomes loose or falls off after 4 days call [employer] at [phone] for  suggestions on securing your monitor   s

## 2023-04-29 NOTE — Progress Notes (Unsigned)
Enrolled patient for a 14 day Zio XT monitor to be mailed to patients home.   Revankar to read 

## 2023-04-30 LAB — COMPREHENSIVE METABOLIC PANEL
ALT: 16 IU/L (ref 0–32)
AST: 22 IU/L (ref 0–40)
Albumin: 4.2 g/dL (ref 3.8–4.9)
Alkaline Phosphatase: 127 IU/L — ABNORMAL HIGH (ref 44–121)
BUN/Creatinine Ratio: 14 (ref 9–23)
BUN: 8 mg/dL (ref 6–24)
Bilirubin Total: 0.4 mg/dL (ref 0.0–1.2)
CO2: 21 mmol/L (ref 20–29)
Calcium: 9.2 mg/dL (ref 8.7–10.2)
Chloride: 106 mmol/L (ref 96–106)
Creatinine, Ser: 0.58 mg/dL (ref 0.57–1.00)
Globulin, Total: 3.5 g/dL (ref 1.5–4.5)
Glucose: 96 mg/dL (ref 70–99)
Potassium: 4.4 mmol/L (ref 3.5–5.2)
Sodium: 139 mmol/L (ref 134–144)
Total Protein: 7.7 g/dL (ref 6.0–8.5)
eGFR: 104 mL/min/{1.73_m2} (ref >59)

## 2023-04-30 LAB — CBC
Hematocrit: 35.7 % (ref 34.0–46.6)
Hemoglobin: 11.5 g/dL (ref 11.1–15.9)
MCH: 28 pg (ref 26.6–33.0)
MCHC: 32.2 g/dL (ref 31.5–35.7)
MCV: 87 fL (ref 79–97)
Platelets: 214 10*3/uL (ref 150–450)
RBC: 4.11 x10E6/uL (ref 3.77–5.28)
RDW: 12.7 % (ref 11.7–15.4)
WBC: 7.6 10*3/uL (ref 3.4–10.8)

## 2023-04-30 LAB — MAGNESIUM: Magnesium: 2 mg/dL (ref 1.6–2.3)

## 2023-05-02 ENCOUNTER — Telehealth: Payer: Self-pay | Admitting: Nurse Practitioner

## 2023-05-02 ENCOUNTER — Other Ambulatory Visit (HOSPITAL_BASED_OUTPATIENT_CLINIC_OR_DEPARTMENT_OTHER): Payer: Self-pay

## 2023-05-02 NOTE — Telephone Encounter (Signed)
 Abigail Butler, Refer to office visit 03/14/2023. Please contact patient via assistance with [facility] [place] interpretor and inform the patient that cardiac clearance has not been received therefore her EGD/colonoscopy 05/05/2023 will need to be cancelled. She was seen by Edd Fabian cardiology NP 04/29/2023, patient endorsed having palpitations and a 14 day monitor was ordered, there were also concerns regarding hypertension and chest discomfort. Pls cancel her EGD/colonoscopy scheduled 05/05/2023 with Dr. Rhea Belton. Procedures to be rescheduled after cardiac clearance received. THX. Dr. Lucy Chris.

## 2023-05-02 NOTE — Telephone Encounter (Signed)
 Procedures were canceled.  Through an Arabic interpreter Pt was left a message to call back.

## 2023-05-03 NOTE — Telephone Encounter (Addendum)
 Pt daughter Abigail Butler made aware of Abigail Evener NP recommendations: Abigail Butler notified that the EGD/colonoscopy have been canceled.  Abigail Butler was notified that after her mom wears the heart monitor and sees cardiology to give our office a call back so that we can reach out to her Cardiologist and request cardiac clearance for the procedures. Abigail Butler  verbalized understanding with all questions answered.

## 2023-05-05 ENCOUNTER — Encounter: Payer: Medicaid Other | Admitting: Internal Medicine

## 2023-05-06 ENCOUNTER — Telehealth: Payer: Self-pay | Admitting: Cardiology

## 2023-05-06 ENCOUNTER — Ambulatory Visit: Attending: Internal Medicine

## 2023-05-06 NOTE — Telephone Encounter (Signed)
 Patient's daughter states she was returning a missed call received this morning.

## 2023-05-10 NOTE — Telephone Encounter (Signed)
SEE RESULT NOTE

## 2023-05-15 IMAGING — DX DG CHEST 2V
2 series · 2 of 2 positions shown · non-contrast
Comparison: July 21, 2021.

CLINICAL DATA: Status post mitral valve repair.

EXAM:
CHEST - 2 VIEW

[dg chest 2 view (1 of 2)]
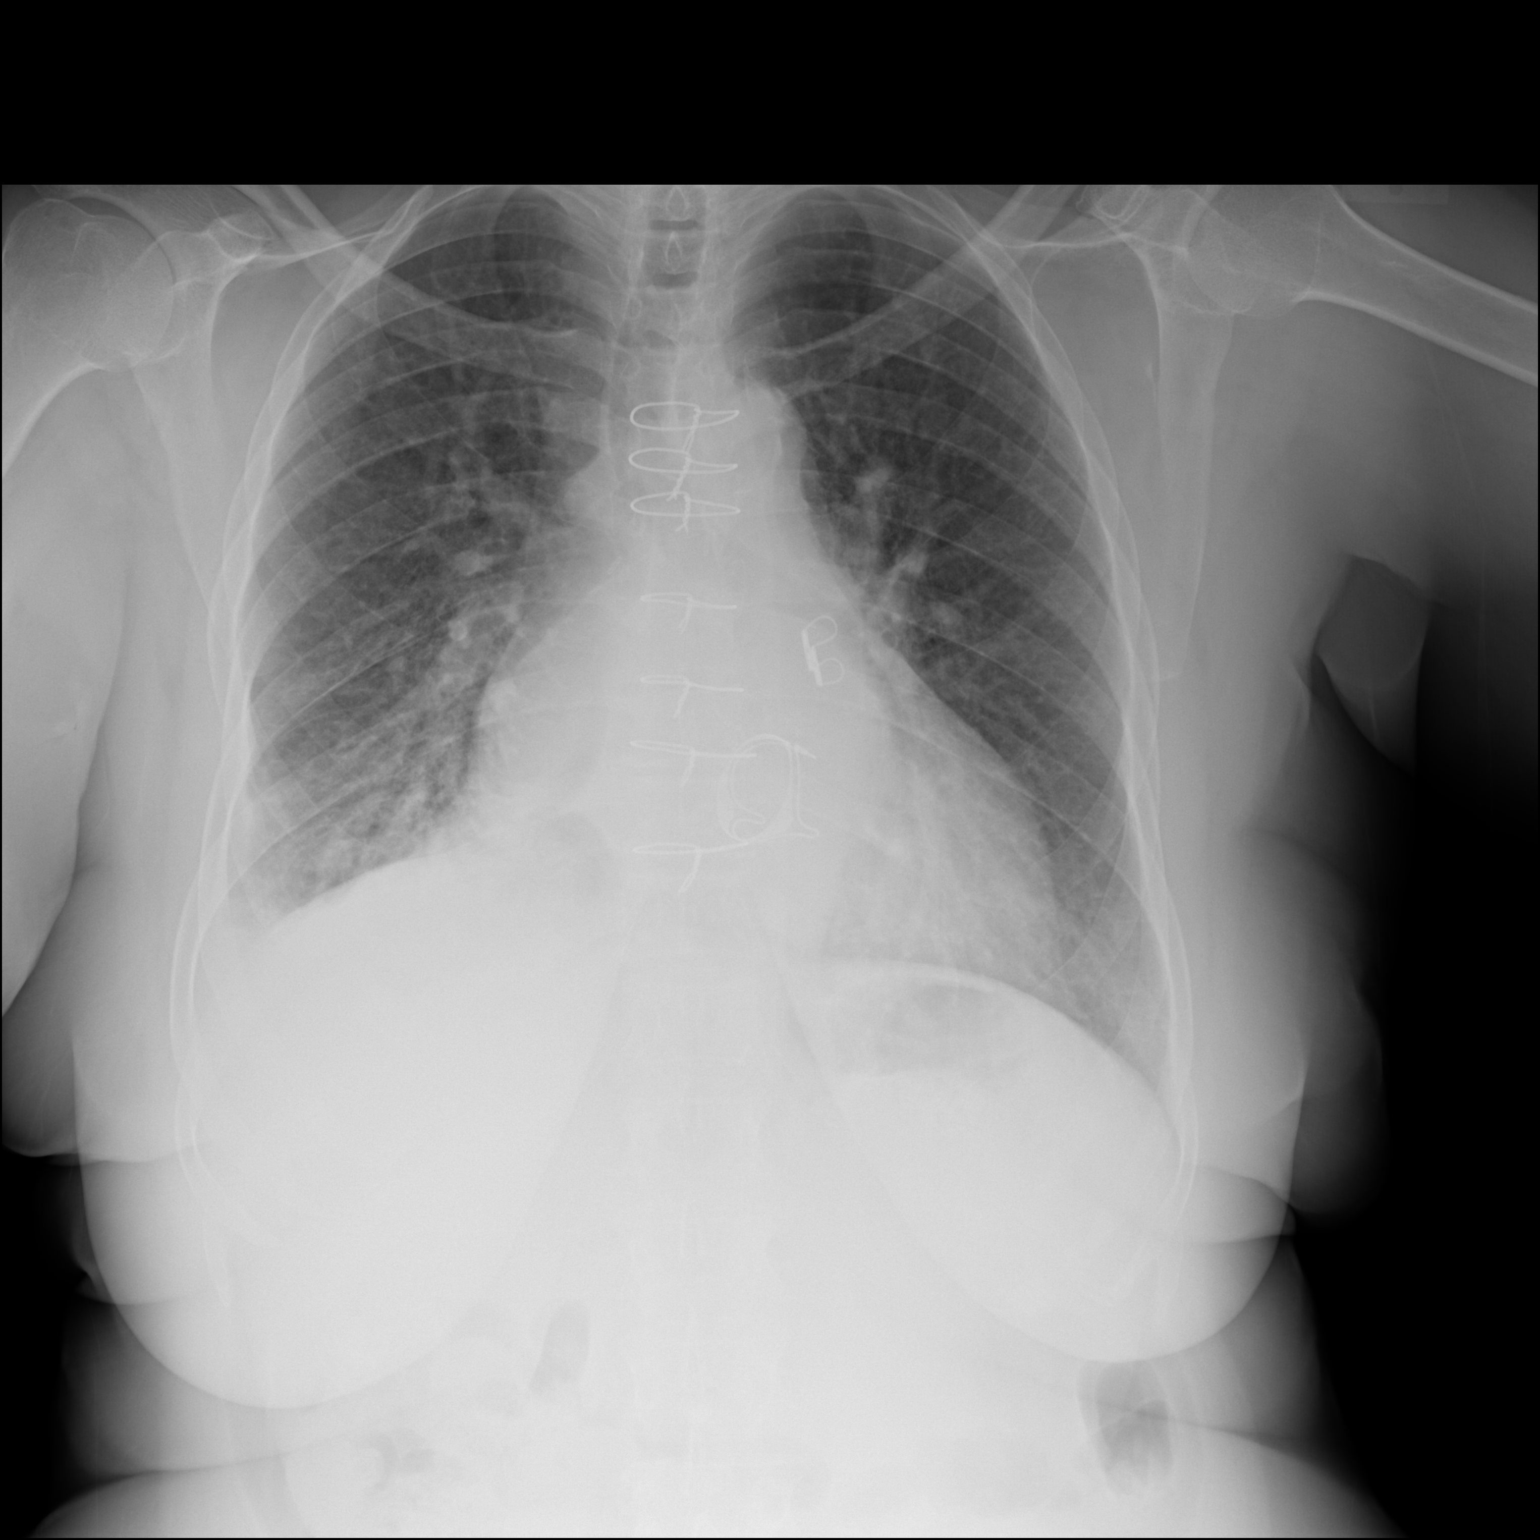

[dg chest 2 view (2 of 2)]
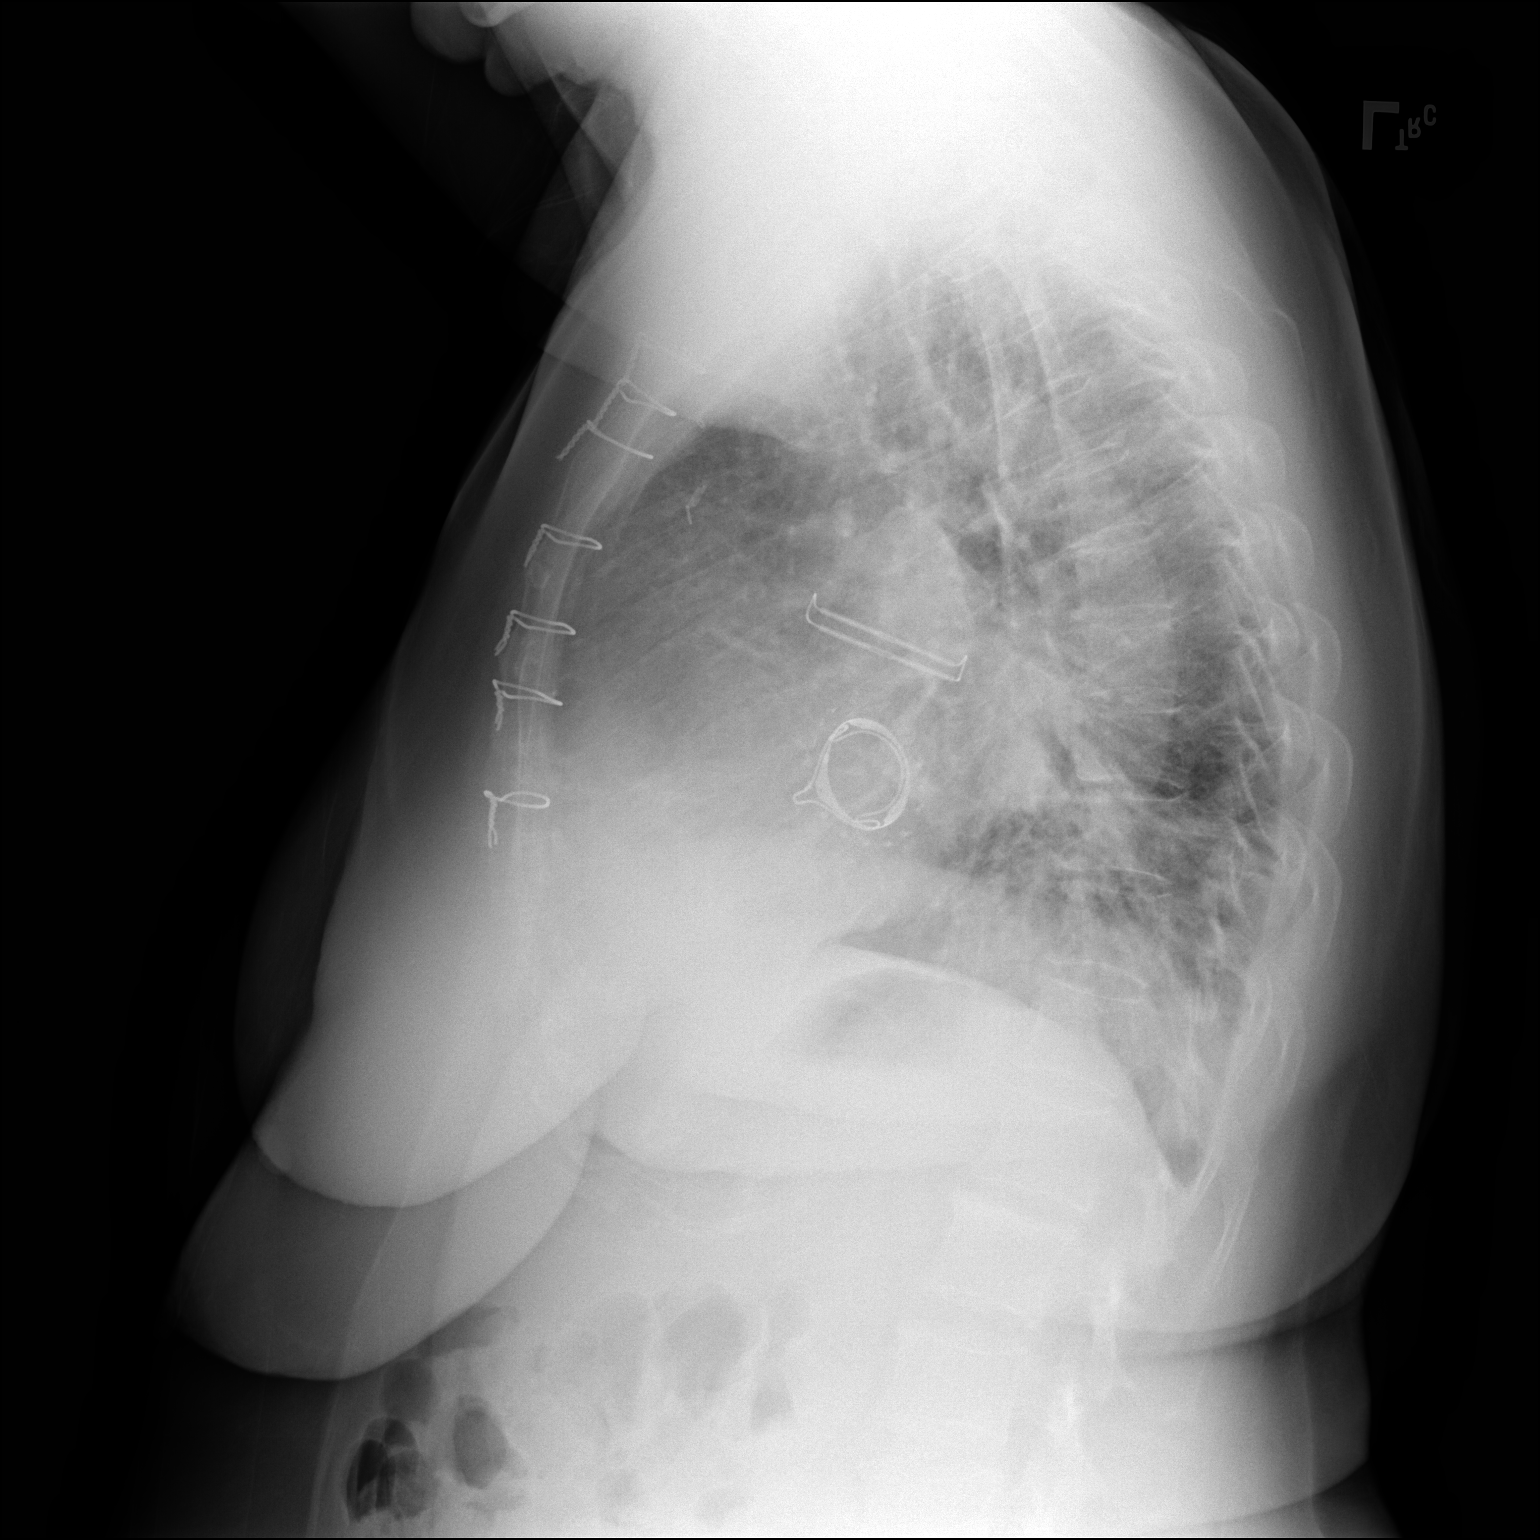

[2 of 2 positions shown; findings below may reference images not displayed]

FINDINGS: Stable cardiomediastinal silhouette. No pneumothorax is noted. Left
lung is clear. Mild right basilar atelectasis and small right
pleural effusion is noted. Bony thorax is unremarkable.
IMPRESSION: Mild right basilar atelectasis and small right pleural effusion is
noted.

## 2023-05-17 ENCOUNTER — Other Ambulatory Visit (HOSPITAL_BASED_OUTPATIENT_CLINIC_OR_DEPARTMENT_OTHER): Payer: Self-pay

## 2023-06-03 ENCOUNTER — Other Ambulatory Visit: Payer: Self-pay

## 2023-06-03 DIAGNOSIS — R002 Palpitations: Secondary | ICD-10-CM

## 2023-06-03 DIAGNOSIS — I471 Supraventricular tachycardia, unspecified: Secondary | ICD-10-CM

## 2023-06-19 NOTE — Progress Notes (Addendum)
 Cardiology Clinic Note   Patient Name: Abigail Butler Date of Encounter: 06/21/2023  Primary Care Provider:  Carleen Chary, DO Primary Cardiologist:  Nelia Balzarine, MD  Patient Profile    Abigail Butler 58 year old female presents to the clinic today for follow-up evaluation of her hypertension and paroxysmal atrial fibrillation and preoperative cardiac evaluation.    Past Medical History    Past Medical History:  Diagnosis Date   Cervical cancer screening 06/04/2021   Chronic left shoulder pain 03/23/2022   Daily headache    "sometimes; often" (02/10/2012)   Diabetes mellitus (HCC) 01/03/2023   Dysuria 10/26/2022   GERD (gastroesophageal reflux disease)    H. pylori infection    ?failed standard tx; currently undergoing levofloxacin  salvage tx   H/O: CVA (cerebrovascular accident) 06/01/2013   Right thalamic CVA 2015   Health care maintenance 12/24/2013   Health care maintenance 12/24/2013   Hypertension 05/11/2017   Long term (current) use of anticoagulants 06/22/2021   Lower extremity edema 09/16/2022   Lumbar disc disease with radiculopathy 10/15/2014   Mass of left breast on mammogram 05/06/2015   Migraine without status migrainosus, not intractable 09/16/2022   Mixed dyslipidemia 11/12/2021   PAF (paroxysmal atrial fibrillation) (HCC) 11/12/2021   Prediabetes 10/15/2014   A1c 5.7 on 10/15/14   Right arm pain 10/15/2016   S/P Maze operation for atrial fibrillation 07/17/2021   S/P MVR (mitral valve replacement) 07/17/2021   TB lung, latent 2009   Treated in 2009 by [facility]   Thrombus of left atrial appendage 06/04/2021   Past Surgical History:  Procedure Laterality Date   DILATION AND CURETTAGE OF UTERUS  ~ 2008   "cleaned out infection" (02/10/2012)   LEFT HEART CATH AND CORONARY ANGIOGRAPHY N/A 04/19/2017   Procedure: LEFT HEART CATH AND CORONARY ANGIOGRAPHY;  Surgeon: [place]azil[place], Peter M, MD;  Location: [facility] INVASIVE CV LAB;  Service:  Cardiovascular;  Laterality: N/A;   MAZE N/A 07/17/2021   Procedure: MAZE;  Surgeon: Bartley Lightning, MD;  Location: [facility] OR;  Service: Open Heart Surgery;  Laterality: N/A;   MITRAL VALVE REPLACEMENT N/A 07/17/2021   Procedure: MITRAL VALVE REPLACEMENT USING 27 MM MITRIS VALVE.  CRYO AND RADIOFREQUENCY MAZE PROCEDURE. LIGATION OF LEFT ATRIAL APPENDAGE.;  Surgeon: Bartley Lightning, MD;  Location: [facility] OR;  Service: Open Heart Surgery;  Laterality: N/A;   TEE WITHOUT CARDIOVERSION N/A 05/27/2021   Procedure: TRANSESOPHAGEAL ECHOCARDIOGRAM (TEE);  Surgeon: Wendie Hamburg, MD;  Location: [facility] ENDOSCOPY;  Service: Cardiovascular;  Laterality: N/A;   TEE WITHOUT CARDIOVERSION N/A 07/17/2021   Procedure: TRANSESOPHAGEAL ECHOCARDIOGRAM (TEE);  Surgeon: Bartley Lightning, MD;  Location: [facility] OR;  Service: Open Heart Surgery;  Laterality: N/A;    Allergies  Allergies  Allergen Reactions   Pork-Derived Products Other (See Comments)    Religous belief    History of Present Illness    Abigail Butler has a PMH of HTN, paroxysmal atrial fibrillation status post maze procedure, CVA, HLD, and is status post mitral valve replacement.  She also underwent left atrial appendage clipping.  Echocardiogram 12/07/2022 showed LVEF of 55-60%, intermediate diastolic filling pressures severely dilated left atria, moderately dilated right atria, repaired/replaced mitral valve with no evidence of mitral valve regurgitation.  Her aortic valve showed mild regurgitation.  She was seen in follow-up by Dr. Lafayette Pierre 11/11/2022.  During that time she was doing well.  She denied cardiac issues.  She was completing her activities of daily living.  She was  walking half an hour a day on regular basis without symptoms.  She presented to the clinic 04/29/23 for follow-up evaluation and preoperative cardiac evaluation.  She stated she had noticed chest discomfort, palpitations, and was concerned about her upcoming colonoscopy/endoscopy.  We  reviewed her prior surgeries and current symptoms.  She noted that her palpitations had been more so in the evening.  She had noticed elevated blood pressure over the last several months.  She reported that she had taken her blood pressure medication that morning.  She had been following a low-sodium diet.  She was fairly physically active.  On exam her chest was tender to palpation.  I reviewed that her pain is musculoskeletal in nature.  She asked about being able to obtain disability.  I expressed concern about her blood pressure and her palpitations.  I  ordered a 14-day cardiac event monitor, CBC, CMP, magnesium , increase her metoprolol  and plan follow-up in 6 to 8 weeks.  I explained that she would need to follow-up with her PCP for evaluation of disability.  She presents with her daughter and interpreter.  Her cardiac event monitor showed 26 runs of SVT with the longest run lasting 13.8 seconds.  I placed referral for EP.  She presents to the clinic today for follow-up evaluation and states she has been monitoring her blood pressure.  We reviewed her blood pressure log.  We reviewed her lab work and her cardiac event monitor.  She is here with interpreter and expresses understanding.  She reports constant chest heaviness, weakness, and dyspnea.  Her echocardiogram was reassuring.  This was explained.  Her blood pressure initially today is 170/68 and on recheck is 142/70.  I will increase her metoprolol  to 50 mg twice daily and add amlodipine  2.5 mg daily.  She has appointment with Dr. Lawana Pray on Monday.  I will defer preoperative cardiac evaluation to him at that time.  We will plan follow-up in 3 to 4 months.  Today she increased lower extremity edema,  melena, hematuria, hemoptysis.      Home Medications    Prior to Admission medications   Medication Sig Start Date End Date Taking? Authorizing Provider  acetaminophen  (TYLENOL ) 500 MG tablet Take 500-1,000 mg by mouth as needed.    [provider]  aspirin  81 MG EC tablet Take 1 tablet (81 mg total) by mouth daily. Swallow whole. 06/04/21   Lillia Reilly, MD  atorvastatin  (LIPITOR) 40 MG tablet Take 1 tablet (40 mg total) by mouth daily. 01/06/23   Revankar, Micael Adas, MD  metoprolol  tartrate (LOPRESSOR ) 25 MG tablet Take 1 tablet (25 mg total) by mouth 2 (two) times daily. 01/05/23   Revankar, Micael Adas, MD    Family History    Family History  Problem Relation Age of Onset   Colon cancer Neg Hx    Colon polyps Neg Hx    Esophageal cancer Neg Hx    Rectal cancer Neg Hx    Stomach cancer Neg Hx    She indicated that the status of her neg hx is unknown.  Social History    Social History   Socioeconomic History   Marital status: Married    Spouse name: Not on file   Number of children: 3   Years of education: Not on file   Highest education level: Not on file  Occupational History   Occupation: Homemaker  Tobacco Use   Smoking status: Never    Passive exposure: Never   Smokeless  tobacco: Never  Vaping Use   Vaping status: Never Used  Substance and Sexual Activity   Alcohol use: No    Alcohol/week: 0.0 standard drinks of alcohol   Drug use: No   Sexual activity: Not on file  Other Topics Concern   Not on file  Social History Narrative   ** Merged History Encounter **       ** Merged History Encounter **       Social Drivers of Corporate investment banker Strain: Not on file  Food Insecurity: Not on file  Transportation Needs: Not on file  Physical Activity: Not on file  Stress: Not on file  Social Connections: Not on file  Intimate Partner Violence: Not on file     Review of Systems    General:  No chills, fever, night sweats or weight changes.  Cardiovascular:  No chest pain, dyspnea on exertion, edema, orthopnea, palpitations, paroxysmal nocturnal dyspnea. Dermatological: No rash, lesions/masses Respiratory: No cough, dyspnea Urologic: No hematuria, dysuria Abdominal:   No nausea,  vomiting, diarrhea, bright red blood per rectum, melena, or hematemesis Neurologic:  No visual changes, wkns, changes in mental status. All other systems reviewed and are otherwise negative except as noted above.  Physical Exam    VS:  BP (!) 142/70   Pulse (!) 59   Ht 5\' 1"  (1.549 m)   Wt 198 lb 3.2 oz (89.9 kg)   SpO2 99%   BMI 37.45 kg/m  , BMI Body mass index is 37.45 kg/m. GEN: Well nourished, well developed, in no acute distress. HEENT: normal. Neck: Supple, no JVD, carotid bruits, or masses. Cardiac: RRR, no murmurs, rubs, or gallops. No clubbing, cyanosis, ankle edema.  Chest heaviness + Respiratory:  Respirations regular and unlabored, clear to auscultation bilaterally. GI: Soft, nontender, nondistended, BS + x 4. MS: no deformity or atrophy. Skin: warm and dry, no rash. Neuro:  Strength and sensation are intact. Psych: Normal affect.  Accessory Clinical Findings    Recent Labs: 04/29/2023: ALT 16; BUN 8; Creatinine, Ser 0.58; Hemoglobin 11.5; Magnesium  2.0; Platelets 214; Potassium 4.4; Sodium 139   Recent Lipid Panel    Component Value Date/Time   CHOL 177 06/04/2021 0949   TRIG 173 (H) 06/04/2021 0949   HDL 32 (L) 06/04/2021 0949   CHOLHDL 5.5 (H) 06/04/2021 0949   CHOLHDL 5.3 02/10/2012 0520   VLDL 24 02/10/2012 0520   LDLCALC 114 (H) 06/04/2021 0949    HYPERTENSION CONTROL Vitals:   06/21/23 1554 06/21/23 1618  BP: (!) 170/68 (!) 142/70    The patient's blood pressure is elevated above target today.  In order to address the patient's elevated BP: Blood pressure will be monitored at home to determine if medication changes need to be made.; A new medication was prescribed today.; A current anti-hypertensive medication was adjusted today.       ECG personally reviewed by me today-   none today.  Echocardiogram 12/07/2022  IMPRESSIONS     1. Left ventricular ejection fraction, by estimation, is 55 to 60%. The  left ventricle has normal function.  The left ventricle has no regional  wall motion abnormalities. Indeterminate diastolic filling due to E-A  fusion.   2. Right ventricular systolic function is mildly reduced. The right  ventricular size is mildly enlarged. There is normal pulmonary artery  systolic pressure.   3. Left atrial size was severely dilated.   4. Right atrial size was moderately dilated.   5. The mitral valve  has been repaired/replaced. Bioprosthetic valve (27mm  Edwards) well seated, leaflets poorly visualized. No evidence of mitral  valve regurgitation. The mean mitral valve gradient is 5.0 mmHg with  average heart rate of 56 bpm.  MV-VTI/LVOT-VTI 1.8.   6. The aortic valve is tricuspid. Aortic valve regurgitation is mild. No  aortic stenosis is present.   7. The inferior vena cava is normal in size with greater than 50%  respiratory variability, suggesting right atrial pressure of 3 mmHg.   Comparison(s): A prior study was performed on 08/28/2021. Echocardiogram  done 08/28/21 showed an EF of 55-60%, RVSP was 45mm Hg, MV prosthetic mean  gradient was similar and Aortic insufficiency was trace.   FINDINGS   Left Ventricle: Left ventricular ejection fraction, by estimation, is 55  to 60%. The left ventricle has normal function. The left ventricle has no  regional wall motion abnormalities. The left ventricular internal cavity  size was normal in size. There is   no left ventricular hypertrophy. Indeterminate diastolic filling due to  E-A fusion.   Right Ventricle: The right ventricular size is mildly enlarged. Right  vetricular wall thickness was not well visualized. Right ventricular  systolic function is mildly reduced. There is normal pulmonary artery  systolic pressure. The tricuspid regurgitant  velocity is 2.71 m/s, and with an assumed right atrial pressure of 3 mmHg,  the estimated right ventricular systolic pressure is 32.4 mmHg.   Left Atrium: Left atrial size was severely dilated.   Right  Atrium: Right atrial size was moderately dilated.   Pericardium: There is no evidence of pericardial effusion.   Mitral Valve: The mitral valve was not well visualized. No evidence of  mitral valve regurgitation. There is a 27 mm Edwards-Mitris bioprosthetic  valve present in the mitral position. MV peak gradient, 20.2 mmHg. The  mean mitral valve gradient is 5.0 mmHg   with average heart rate of 56 bpm.   Tricuspid Valve: The tricuspid valve is not well visualized. Tricuspid  valve regurgitation is mild.   Aortic Valve: The aortic valve is tricuspid. Aortic valve regurgitation is  mild. Aortic regurgitation PHT measures 1299 msec. No aortic stenosis is  present. Aortic valve mean gradient measures 5.0 mmHg. Aortic valve peak  gradient measures 8.3 mmHg.  Aortic valve area, by VTI measures 1.90 cm.   Pulmonic Valve: The pulmonic valve was not well visualized. Pulmonic valve  regurgitation is trivial.   Aorta: The aortic root and ascending aorta are structurally normal, with  no evidence of dilitation.   Venous: The inferior vena cava is normal in size with greater than 50%  respiratory variability, suggesting right atrial pressure of 3 mmHg.   IAS/Shunts: The interatrial septum was not well visualized.     Cardiac event monitor 04/29/2023  Patient had a min HR of 47 bpm, max HR of 164 bpm, and avg HR of 58 bpm.    Predominant underlying rhythm was Sinus Rhythm.    26 Supraventricular Tachycardia runs occurred, the run with the fastest interval lasting 4 beats with a max rate of 145 bpm, the longest lasting 13.8 secs with an avg rate of 93 bpm. Isolated SVEs were rare (<1.0%), SVE Couplets were rare (<1.0%), and SVE Triplets were rare (<1.0%). I   solated VEs were occasional (1.4%, 14472), VE Couplets were rare (<1.0%, 245), and VE Triplets were rare (<1.0%, 32). Ventricular Bigeminy and Trigeminy were present.4 Ventricular Tachycardia runs occurred, the run with the fastest  interval lasting 18 beats with a  max rate of 164 bpm (avg 147 bpm); the run with the fastest interval was also the longest. Episodes of Ventricular Tachycardia may be Supraventricular Tachycardia with possible aberrancy.    Impression: Abnormal event monitor with nonsustained ventricular tachycardia and brief atrial runs noted.   Assessment & Plan   1.  Palpitations-continues with intermittent episodes of palpitations.  Cardiac event monitor reviewed.  Lab work reassuring.    Previous Maze procedure and LAA.  Cardiac event monitor showed 26 episodes of SVT with the longest run lasting approximately 13.8 seconds.  Refered to EP Follow-up with the EP Maintain p.o. hydration Increase metoprolol  to 50 mg twice daily Avoid triggers caffeine, chocolate, EtOH, dehydration etc. Follow-up with Dr. Lawana Pray on 06/27/2023  Paroxysmal atrial fibrillation-no atrial fibrillation noted on recent cardiac event monitor.  Status post Maze procedure and LAA clipping.  Denies episodes of accelerated or irregular heartbeat. Continue aspirin , metoprolol  Avoid triggers caffeine, chocolate, EtOH, dehydration etc. Increased activity as tolerated  Essential hypertension-BP today 142/70.  Instructions provided for metoprolol  and amlodipine . Heart healthy low-sodium diet-reviewed Maintain blood pressure log Continue metoprolol  Start amlodipine  2.5 mg daily Maintain blood pressure log-goal 130s over 80s  Status post mitral valve replacement-performing all normal daily activities.  No changes in breathing or activity intolerance.  Echocardiogram 12/07/2022 showed normal LVEF, intermediate diastolic filling pressures, severely dilated left atria, moderately dilated right atria, repaired/replaced mitral valve with well-seated bioprosthetic valve showing no evidence of regurgitation. Plan for repeat echocardiogram 10/25.  History of CVA-neurologically intact. Follows with PCP  Preoperative cardiac evaluation-colonoscopy  and endoscopy, [facility], fax #[phone]    Primary Cardiologist: Nelia Balzarine, MD  Chart reviewed as part of pre-operative protocol coverage. Given past medical history and time since last visit, based on ACC/AHA guidelines, Keeva G[place] would be at acceptable risk for the planned procedure without further cardiovascular testing.   Patient was advised that if she develops new symptoms prior to surgery to contact our office to arrange a follow-up appointment.  He verbalized understanding.  Her aspirin  may be held for 7 days prior to her procedure.  Please resume as soon as hemostasis is achieved at the discretion of the surgeon.  I will route this recommendation to the requesting party via Epic fax function and remove from pre-op pool.       Chest discomfort-reports chronic chest heaviness, weakness and dyspnea.  Echocardiogram reassuring.  Previously noted to be precordial in nature. Patient reassured and echocardiogram reviewed  Disposition: Follow-up with Dr. Revankar or me in 3-4 months.  Chet Cota. Jarvin Ogren NP-C     06/21/2023, 4:29 PM [facility] [address] Office [phone] Fax [phone]    I spent 14 minutes examining this patient, reviewing medications, and using patient centered shared decision making involving their cardiac care.   I spent  20 minutes reviewing past medical history,  medications, and prior cardiac tests.

## 2023-06-21 ENCOUNTER — Ambulatory Visit: Payer: Medicaid Other | Attending: General Practice | Admitting: General Practice

## 2023-06-21 ENCOUNTER — Encounter: Payer: Self-pay | Admitting: General Practice

## 2023-06-21 ENCOUNTER — Other Ambulatory Visit (HOSPITAL_BASED_OUTPATIENT_CLINIC_OR_DEPARTMENT_OTHER): Payer: Self-pay

## 2023-06-21 VITALS — BP 142/70 | HR 59 | Ht 61.0 in | Wt 198.2 lb

## 2023-06-21 DIAGNOSIS — I480 Paroxysmal atrial fibrillation: Secondary | ICD-10-CM | POA: Diagnosis not present

## 2023-06-21 DIAGNOSIS — Z9889 Other specified postprocedural states: Secondary | ICD-10-CM

## 2023-06-21 DIAGNOSIS — I10 Essential (primary) hypertension: Secondary | ICD-10-CM

## 2023-06-21 DIAGNOSIS — Z8673 Personal history of transient ischemic attack (TIA), and cerebral infarction without residual deficits: Secondary | ICD-10-CM

## 2023-06-21 DIAGNOSIS — R002 Palpitations: Secondary | ICD-10-CM

## 2023-06-21 DIAGNOSIS — Z952 Presence of prosthetic heart valve: Secondary | ICD-10-CM

## 2023-06-21 DIAGNOSIS — Z8679 Personal history of other diseases of the circulatory system: Secondary | ICD-10-CM

## 2023-06-21 MED ORDER — AMLODIPINE BESYLATE 2.5 MG PO TABS
2.5000 mg | ORAL_TABLET | Freq: Every day | ORAL | 6 refills | Status: DC
  Filled 2023-06-21: qty 30, 30d supply, fill #0

## 2023-06-21 NOTE — Patient Instructions (Addendum)
 Medication Instructions:  TAKE YOUR METOPROLOL  25MG -TAKE 2 TAB IN THE AM  THEN TAKE 2 TAB IN THE PM AFTER THEY ARE GONE TAKE 50 MG DAILY(NEW RX)-????? ?????????? 25 ??? - ????? ????? ?? ?????? ?? ????? ????? ?? ?????? ??? ???? ??????. ????? 50 ??? ?????? (???? ???? ?????) tanawal mitubrulul 25 milagh - tanawal qursayn fi alsabah thuma tanawal qursayn fi almasa' baed nafadh aljureati. tanawal 50 milagh ywmyan (wasfatan tibiyatan jadidatan)  TAKE AMLODIPINE  2.5MG  DAILY-????? ????????? 2.5 ??? ?????? tanawal 'amludibin 2.5 milghin ywmyan *If you need a refill on your cardiac medications before your next appointment, please call your pharmacy*  Lab Work: NONE  Follow-Up: At [facility], you and your health needs are our priority.  As part of our continuing mission to provide you with exceptional heart care, our providers are all part of one team.  This team includes your primary Cardiologist (physician) and Advanced Practice Providers or APPs (Physician Assistants and Nurse Practitioners) who all work together to provide you with the care you need, when you need it.  Your next appointment:   3 month(s) -and KEEP SCHEDULED DR CAMNITZ APPOINTMENT  Provider:   Nelia Balzarine, MD or Lawana Pray, NP          We recommend signing up for the patient portal called "MyChart".  Sign up information is provided on this After Visit Summary.  MyChart is used to connect with patients for Virtual Visits (Telemedicine).  Patients are able to view lab/test results, encounter notes, upcoming appointments, etc.  Non-urgent messages can be sent to your provider as well.   To learn more about what you can do with MyChart, go to [email or web address].   Other Instructions TAKE AND LOG YOUR BLOOD PRESSURE AND HEART RATE AND BRING LOG WITH YOU TO YOUR FOLLOW UP APPOINTMENT-?? ???? ?????? ??? ???? ????? ????? ????? ????? ????? ??? ??? ???? ???????? ????? ?? qum bi'akhdh watasjil daght aldam wamueadal  darabat alqalb waihdur alsijila maeak 'iilaa maweid almutabaeat alkhasi bik MAKE SURE AND STAY HYDRATED-???? ?? ?????? ?????. ta'akad min albaqa' rtban PLEASE READ AND FOLLOW ATTACHED  SALTY 6-???? ????? ??????? ????? ?????? ??? 6 yurjaa qira'at wamutabaeat almilh almirfaq raqm 6     PLEASE READ AND FOLLOW ATTACHED  SALTY 6     1st Floor: - Lobby - Registration  - Pharmacy  - Lab - Cafe  2nd Floor: - PV Lab - Diagnostic Testing (echo, CT, nuclear med)  3rd Floor: - Vacant  4th Floor: - TCTS (cardiothoracic surgery) - AFib Clinic - Structural Heart Clinic - Vascular Surgery  - Vascular Ultrasound  5th Floor: - HeartCare Cardiology (general and EP) - Clinical Pharmacy for coumadin , hypertension, lipid, weight-loss medications, and med management appointments    Valet parking services will be available as well.

## 2023-06-22 ENCOUNTER — Other Ambulatory Visit (HOSPITAL_BASED_OUTPATIENT_CLINIC_OR_DEPARTMENT_OTHER): Payer: Self-pay

## 2023-06-26 NOTE — Progress Notes (Unsigned)
  Electrophysiology Office Note:   Date:  06/27/2023  ID:  Abigail Butler, DOB 02-21-1966, MRN [id number]  Primary Cardiologist: Abigail Balzarine, MD Primary Heart Failure: None Electrophysiologist: Abigail Heemstra [name], MD      History of Present Illness:   Abigail Butler is a 58 y.o. female with h/o mitral stenosis, atrial fibrillation/flutter, hypertension, post MVR, surgical maze, left atrial appendage clip seen today for routine electrophysiology followup.   Since last being seen in our clinic the patient reports doing very*for the.  she denies chest pain, palpitations, dyspnea, PND, orthopnea, nausea, vomiting, dizziness, syncope, edema, weight gain, or early satiety.   Review of systems complete and found to be negative unless listed in HPI.   EP Information / Studies Reviewed:    EKG is not ordered today. EKG from 04/29/23 reviewed which showed sinus rhythm        Risk Assessment/Calculations:    CHA2DS2-VASc Score =     This indicates a  % annual risk of stroke. The patient's score is based upon:             Physical Exam:   VS:  BP (!) 140/80 (BP Location: Right Arm, Patient Position: Sitting, Cuff Size: Large)   Pulse 74   Ht 5\' 1"  (1.549 m)   Wt 198 lb (89.8 kg)   SpO2 95%   BMI 37.41 kg/m    Wt Readings from Last 3 Encounters:  06/27/23 198 lb (89.8 kg)  06/21/23 198 lb 3.2 oz (89.9 kg)  04/29/23 194 lb (88 kg)     GEN: Well nourished, well developed in no acute distress NECK: No JVD; No carotid bruits CARDIAC: Regular rate and rhythm, no murmurs, rubs, gallops RESPIRATORY:  Clear to auscultation without rales, wheezing or rhonchi  ABDOMEN: Soft, non-tender, non-distended EXTREMITIES:  No edema; No deformity   ASSESSMENT AND PLAN:    1.  Paroxysmal atrial fibrillation/flutter: Postsurgical maze with left atrial appendage clip.  No episodes on recent cardiac monitor.  She has had episodes of SVT which were all quite short.    2.  Secondary hypercoagulable  state: On warfarin for atrial fibrillation  3.  Status post MVR: Stable on most recent echo.  Plan per primary cardiology  4.  SVT: Short episodes, all less than 13 seconds.  She is somewhat symptomatic from these.  Abigail Butler increase metoprolol  to 50 mg twice daily.  She can follow-up with her primary cardiologist.  If she has further issues, would be happy to see her back in clinic.  Follow up with Dr. Lawana Butler  PRN   Signed, Ninamarie Keel [name], MD

## 2023-06-27 ENCOUNTER — Telehealth: Payer: Self-pay | Admitting: Nurse Practitioner

## 2023-06-27 ENCOUNTER — Other Ambulatory Visit (HOSPITAL_BASED_OUTPATIENT_CLINIC_OR_DEPARTMENT_OTHER): Payer: Self-pay

## 2023-06-27 ENCOUNTER — Ambulatory Visit: Attending: Cardiology | Admitting: Cardiology

## 2023-06-27 ENCOUNTER — Encounter: Payer: Self-pay | Admitting: Cardiology

## 2023-06-27 VITALS — BP 140/80 | HR 74 | Ht 61.0 in | Wt 198.0 lb

## 2023-06-27 DIAGNOSIS — D6869 Other thrombophilia: Secondary | ICD-10-CM | POA: Diagnosis not present

## 2023-06-27 DIAGNOSIS — I471 Supraventricular tachycardia, unspecified: Secondary | ICD-10-CM | POA: Diagnosis not present

## 2023-06-27 DIAGNOSIS — I480 Paroxysmal atrial fibrillation: Secondary | ICD-10-CM | POA: Diagnosis present

## 2023-06-27 DIAGNOSIS — A048 Other specified bacterial intestinal infections: Secondary | ICD-10-CM

## 2023-06-27 DIAGNOSIS — Z9889 Other specified postprocedural states: Secondary | ICD-10-CM | POA: Insufficient documentation

## 2023-06-27 DIAGNOSIS — Z1211 Encounter for screening for malignant neoplasm of colon: Secondary | ICD-10-CM

## 2023-06-27 MED ORDER — METOPROLOL TARTRATE 50 MG PO TABS
50.0000 mg | ORAL_TABLET | Freq: Two times a day (BID) | ORAL | 3 refills | Status: DC
  Filled 2023-06-27: qty 180, 90d supply, fill #0

## 2023-06-27 NOTE — Telephone Encounter (Signed)
 DD, Refer to office visit 03/14/2023. Cardiac clearance received, refer to cardiology office note with clearance dated 06/21/2023. Pls contact [place] interpretor and schedule the patient for an EGD and colonoscopy in [facility] with Dr. Bridgett Camps. THX.

## 2023-06-27 NOTE — Telephone Encounter (Signed)
 Contacted [facility] interpreter services and interpreter could not reach patient. Interpreter left a voicemail for patient to return call.

## 2023-06-27 NOTE — Patient Instructions (Addendum)
 Medication Instructions:  Your physician has recommended you make the following change in your medication: INCREASE Metoprolol  (Lopressor   to 50 mg TWICE daily  *If you need a refill on your cardiac medications before your next appointment, please call your pharmacy*   Lab Work: None ordered   Testing/Procedures: None ordered   Follow-Up: At [facility], you and your health needs are our priority.  As part of our continuing mission to provide you with exceptional heart care, we have created designated Provider Care Teams.  These Care Teams include your primary Cardiologist (physician) and Advanced Practice Providers (APPs -  Physician Assistants and Nurse Practitioners) who all work together to provide you with the care you need, when you need it.  We recommend signing up for the patient portal called "MyChart".  Sign up information is provided on this After Visit Summary.  MyChart is used to connect with patients for Virtual Visits (Telemedicine).  Patients are able to view lab/test results, encounter notes, upcoming appointments, etc.  Non-urgent messages can be sent to your provider as well.   To learn more about what you can do with MyChart, go to [email or web address].    Your next appointment:   Make a follow up  with Dr. Lafayette Pierre  The format for your next appointment:   In Person  Provider:   Hillis Lu, MD   Your physician recommends that you schedule a follow-up appointment as needed with Dr. Lawana Pray.   Thank you for choosing [facility]!!   Reece Cane, RN [phone]

## 2023-07-04 ENCOUNTER — Other Ambulatory Visit (HOSPITAL_BASED_OUTPATIENT_CLINIC_OR_DEPARTMENT_OTHER): Payer: Self-pay

## 2023-07-11 ENCOUNTER — Other Ambulatory Visit (HOSPITAL_BASED_OUTPATIENT_CLINIC_OR_DEPARTMENT_OTHER): Payer: Self-pay

## 2023-07-11 MED ORDER — NA SULFATE-K SULFATE-MG SULF 17.5-3.13-1.6 GM/177ML PO SOLN
1.0000 | Freq: Once | ORAL | 0 refills | Status: AC
  Filled 2023-07-11: qty 354, 1d supply, fill #0

## 2023-07-11 NOTE — Telephone Encounter (Signed)
 Romanita's daughter Daina Drum called back.  We scheduled her EGD/ Colon for 08/09/23 with Dr. Bridgett Camps.  I reviewed the instructions in english with her and I advised her that I will mail a copy of the instructions in Arabic to her.  I verified her mailing address and I told her that if she doesn't receive it by the end of the week she should call me back.

## 2023-07-11 NOTE — Telephone Encounter (Signed)
 I tried calling patient with interpreter, [place] # [phone].  We tried [phone] which was her daughter and she advised us  to call the home number [phone].  No one answered so he left a vml requesting that she call back.

## 2023-07-11 NOTE — Telephone Encounter (Signed)
 Colleen this is just an FYI that I took care of this today.  I also told Daina Drum that her mom has to hold her Aspirin  7 days prior to her procedure (per the OV note from the cardiologist on 06/21/23) and she will be told when to restart after her procedure.

## 2023-07-11 NOTE — Addendum Note (Signed)
 Addended by: Orlene Bjork on: 07/11/2023 04:41 PM   Modules accepted: Orders

## 2023-07-21 ENCOUNTER — Other Ambulatory Visit (HOSPITAL_BASED_OUTPATIENT_CLINIC_OR_DEPARTMENT_OTHER): Payer: Self-pay

## 2023-08-03 LAB — HM DIABETES EYE EXAM

## 2023-08-08 ENCOUNTER — Ambulatory Visit: Payer: Self-pay

## 2023-08-08 ENCOUNTER — Telehealth: Payer: Self-pay | Admitting: Internal Medicine

## 2023-08-08 NOTE — Telephone Encounter (Signed)
 Good afternoon Dr. Bridgett Camps this patient daughter called and stated that her mother primary cardiologist will schedule her mother for tomorrow at 4 PM due to patient feet and hands swelling. Patient daughter rescheduled her mother endo colon for August the 12 at 2 PM. Patient was originally scheduled for June the 12 th. Please advise.

## 2023-08-08 NOTE — Telephone Encounter (Signed)
 I talked to pt's daughter who stated pt is scheduled for Endo with [facility] tomorrow. But pt c/o swollen feet/hands. And want pt to be seen before the procedure;and will cancel the appt if necessary. Appt schedule with [facility] tomorrow Tuesday @ 1015 AM.

## 2023-08-08 NOTE — Telephone Encounter (Signed)
 Copied from CRM [id number]. Topic: Clinical - Red Word Triage >> Aug 08, 2023  3:24 PM Abigail Butler wrote: Red Word that prompted transfer to Nurse Triage: Feet swollen for 3 days and hands swollen since  yesterday Reason for Disposition  [1] Swollen foot AND [2] no fever  (Exceptions: localized bump from bunions, calluses, insect bite, sting)  Answer Assessment - Initial Assessment Questions 1. ONSET: "When did the pain start?"      3 days ago 2. LOCATION: "Where is the pain located?"      Bilateral feet 3. PAIN: "How bad is the pain?"    (Scale 1-10; or mild, moderate, severe)  - MILD (1-3): doesn't interfere with normal activities.   - MODERATE (4-7): interferes with normal activities (e.g., work or school) or awakens from sleep, limping.   - SEVERE (8-10): excruciating pain, unable to do any normal activities, unable to walk.      Mild-moderate 4. WORK OR EXERCISE: "Has there been any recent work or exercise that involved this part of the body?"      no 5. CAUSE: "What do you think is causing the foot pain?"     unsure 6. OTHER SYMPTOMS: "Do you have any other symptoms?" (e.g., leg pain, rash, fever, numbness)     Swelling to feet and ankles  Patient is having swelling to bilateral feet and hands. Told by GI to speak with cardiology to make sure she is okay to have her procedure with GI tomorrow.  Protocols used: Foot Pain-A-AH

## 2023-08-08 NOTE — Telephone Encounter (Signed)
 Requesting to speak with a nurse in regards to tomorrows procedure. Please advise.   Thank you

## 2023-08-08 NOTE — Telephone Encounter (Signed)
 Spoke with the patient's daughter. She reports that her mother's feet and hands are noticeably more swollen in the last 3 days. She followed up with Dr. Lawana Pray [facility] and her Metoprolol  was increased to 50 mg BID other wise the patient was reported to be doing well and told to follow up with her Primary cardiologist. Since the swelling is a change I told the daughter to call the primary cardiologist and notify them and see if ok to proceed with Endo/Colon tomorrow. Daughter verbalized understanding and will call us  back.

## 2023-08-09 ENCOUNTER — Other Ambulatory Visit: Payer: Self-pay

## 2023-08-09 ENCOUNTER — Ambulatory Visit: Admitting: Internal Medicine

## 2023-08-09 ENCOUNTER — Other Ambulatory Visit (HOSPITAL_BASED_OUTPATIENT_CLINIC_OR_DEPARTMENT_OTHER): Payer: Self-pay

## 2023-08-09 ENCOUNTER — Encounter: Admitting: Internal Medicine

## 2023-08-09 VITALS — BP 149/73 | HR 52 | Temp 98.3°F | Ht 61.0 in | Wt 200.8 lb

## 2023-08-09 DIAGNOSIS — I10 Essential (primary) hypertension: Secondary | ICD-10-CM

## 2023-08-09 DIAGNOSIS — I110 Hypertensive heart disease with heart failure: Secondary | ICD-10-CM | POA: Diagnosis present

## 2023-08-09 DIAGNOSIS — I5030 Unspecified diastolic (congestive) heart failure: Secondary | ICD-10-CM | POA: Insufficient documentation

## 2023-08-09 DIAGNOSIS — I480 Paroxysmal atrial fibrillation: Secondary | ICD-10-CM

## 2023-08-09 HISTORY — DX: Unspecified diastolic (congestive) heart failure: I50.30

## 2023-08-09 MED ORDER — TORSEMIDE 20 MG PO TABS
20.0000 mg | ORAL_TABLET | Freq: Every day | ORAL | 0 refills | Status: DC
  Filled 2023-08-09: qty 30, 30d supply, fill #0

## 2023-08-09 NOTE — Telephone Encounter (Signed)
 APP visit in July to followup cardiology visit and ensure EGD/colon still appropriate and in what setting JMP

## 2023-08-09 NOTE — Telephone Encounter (Signed)
 Pt scheduled to see Brigitte Canard PA 09/14/23 at 1:30pm. Appt letter mailed to pt.

## 2023-08-09 NOTE — Progress Notes (Signed)
 CC: swelling  HPI:  Abigail Butler is a 58 y.o. female living with a history stated below and presents today for evaluation of bilateral lower extremity swelling. Please see problem based assessment and plan for additional details.  Past Medical History:  Diagnosis Date   Cervical cancer screening 06/04/2021   Chronic left shoulder pain 03/23/2022   Daily headache    "sometimes; often" (02/10/2012)   Diabetes mellitus (HCC) 01/03/2023   Dysuria 10/26/2022   GERD (gastroesophageal reflux disease)    H. pylori infection    ?failed standard tx; currently undergoing levofloxacin  salvage tx   H/O: CVA (cerebrovascular accident) 06/01/2013   Right thalamic CVA 2015   Health care maintenance 12/24/2013   Health care maintenance 12/24/2013   Hypertension 05/11/2017   Long term (current) use of anticoagulants 06/22/2021   Lower extremity edema 09/16/2022   Lumbar disc disease with radiculopathy 10/15/2014   Mass of left breast on mammogram 05/06/2015   Migraine without status migrainosus, not intractable 09/16/2022   Mixed dyslipidemia 11/12/2021   PAF (paroxysmal atrial fibrillation) (HCC) 11/12/2021   Prediabetes 10/15/2014   A1c 5.7 on 10/15/14   Right arm pain 10/15/2016   S/P Maze operation for atrial fibrillation 07/17/2021   S/P MVR (mitral valve replacement) 07/17/2021   TB lung, latent 2009   Treated in 2009 by [facility]   Thrombus of left atrial appendage 06/04/2021    Current Outpatient Medications on File Prior to Visit  Medication Sig Dispense Refill   acetaminophen  (TYLENOL ) 500 MG tablet Take 500-1,000 mg by mouth as needed.     amLODipine  (NORVASC ) 2.5 MG tablet Take 1 tablet (2.5 mg total) by mouth daily. 30 tablet 6   aspirin  81 MG EC tablet Take 1 tablet (81 mg total) by mouth daily. Swallow whole. 150 tablet 2   atorvastatin  (LIPITOR) 40 MG tablet Take 1 tablet (40 mg total) by mouth daily. 90 tablet 2   metoprolol  tartrate (LOPRESSOR ) 50  MG tablet Take 1 tablet (50 mg total) by mouth 2 (two) times daily. 180 tablet 3   No current facility-administered medications on file prior to visit.    Family History  Problem Relation Age of Onset   Colon cancer Neg Hx    Colon polyps Neg Hx    Esophageal cancer Neg Hx    Rectal cancer Neg Hx    Stomach cancer Neg Hx     Social History   Socioeconomic History   Marital status: Married    Spouse name: Not on file   Number of children: 3   Years of education: Not on file   Highest education level: Not on file  Occupational History   Occupation: Homemaker  Tobacco Use   Smoking status: Never    Passive exposure: Never   Smokeless tobacco: Never  Vaping Use   Vaping status: Never Used  Substance and Sexual Activity   Alcohol use: No    Alcohol/week: 0.0 standard drinks of alcohol   Drug use: No   Sexual activity: Not on file  Other Topics Concern   Not on file  Social History Narrative   ** Merged History Encounter **       ** Merged History Encounter **       Social Drivers of Corporate investment banker Strain: Not on file  Food Insecurity: No Food Insecurity (08/09/2023)   Hunger Vital Sign    Worried About Running Out of Food in the Last Year: Never true  Ran Out of Food in the Last Year: Never true  Transportation Needs: No Transportation Needs (08/09/2023)   PRAPARE - Administrator, Civil Service (Medical): No    Lack of Transportation (Non-Medical): No  Physical Activity: Not on file  Stress: No Stress Concern Present (08/09/2023)   [employer] of Occupational Health - Occupational Stress Questionnaire    Feeling of Stress : Not at all  Social Connections: Moderately Isolated (08/09/2023)   Social Connection and Isolation Panel [NHANES]    Frequency of Communication with Friends and Family: More than three times a week    Frequency of Social Gatherings with Friends and Family: More than three times a week    Attends Religious  Services: Never    Database administrator or Organizations: No    Attends Banker Meetings: Never    Marital Status: Married  Catering manager Violence: Not At Risk (08/09/2023)   Humiliation, Afraid, Rape, and Kick questionnaire    Fear of Current or Ex-Partner: No    Emotionally Abused: No    Physically Abused: No    Sexually Abused: No    Review of Systems: ROS negative except for what is noted on the assessment and plan.  Vitals:   08/09/23 1052  BP: (!) 149/73  Pulse: (!) 52  Temp: 98.3 F (36.8 C)  TempSrc: Oral  SpO2: 95%  Weight: 200 lb 12.8 oz (91.1 kg)  Height: 5\' 1"  (1.549 m)    Physical Exam: Constitutional: appears well, NAD Cardiovascular: regular rate and rhythm, no m/r/g, no JVD Pulmonary/Chest: normal work of breathing on room air, lungs clear to auscultation bilaterally MSK: normal bulk and tone, 1+ bilateral lower extremity edema up to mid shin  Neurological: alert & oriented x 3, no focal deficit Skin: warm and dry Psych: normal mood and behavior  Assessment & Plan:    Patient discussed with Dr. Jarvis Mesa  (HFpEF) heart failure with preserved ejection fraction (HCC) The patient was supposed to undergo an EGD/colonoscopy today, but the procedure canceled as she has developed lower extremity swelling and needed to get this evaluated prior to undergoing the procedure.  The patient states that her bilateral lower extremities have been swollen for the last 3 to 4 days, with an unclear trigger.  She denies any recent illnesses or changes to her diet.  In addition to her lower extremity swelling, the patient does endorse dyspnea on exertion and orthopnea.  Unclear if she is experiencing PND (due to language barrier), but she denies any chest pain or palpitations.  On exam the patient has 1+ bilateral lower extremity edema up to her mid shins.  Her lungs are clear and without crackles.  Heart is regular rate and rhythm and she does not have JVD.  Of  note, her last echocardiogram was in October 2024 and it showed a preserved ejection fraction, known bioprosthetic mitral valve, but overall was unremarkable.  Clinically, I suspect that the patient has HFpEF.  She is warm + wet on my evaluation and is stable for outpatient management/diuresis. She previously was on torsemide  20 mg daily, however, it was discontinued and I am not sure why.  Plan: - Start torsemide  20 mg daily - BMP today - Follow-up in 1 week (if euvolemic at that time, patient can reschedule her EGD/colonoscopy) - Advised to follow-up with cardiology - Consider echocardiogram; did not order today as it is not changing my management of this patient   Alissah Redmon, D.O. [place] Medicine, PGY-3 Phone: [phone] Date 08/09/2023 Time 11:44 AM

## 2023-08-09 NOTE — Progress Notes (Signed)
 Internal Medicine Clinic Attending  Case discussed with the resident at the time of the visit.  We reviewed the resident's history and exam and pertinent patient test results.  I agree with the assessment, diagnosis, and plan of care documented in the resident's note.

## 2023-08-09 NOTE — Assessment & Plan Note (Addendum)
 The patient was supposed to undergo an EGD/colonoscopy today, but the procedure canceled as she has developed lower extremity swelling and needed to get this evaluated prior to undergoing the procedure.  The patient states that her bilateral lower extremities have been swollen for the last 3 to 4 days, with an unclear trigger.  She denies any recent illnesses or changes to her diet.  In addition to her lower extremity swelling, the patient does endorse dyspnea on exertion and orthopnea.  Unclear if she is experiencing PND (due to language barrier), but she denies any chest pain or palpitations.  On exam the patient has 1+ bilateral lower extremity edema up to her mid shins.  Her lungs are clear and without crackles.  Heart is regular rate and rhythm and she does not have JVD.  Of note, her last echocardiogram was in October 2024 and it showed a preserved ejection fraction, known bioprosthetic mitral valve, but overall was unremarkable.  Clinically, I suspect that the patient has HFpEF.  She is warm + wet on my evaluation and is stable for outpatient management/diuresis. She previously was on torsemide  20 mg daily, however, it was discontinued and I am not sure why.  Plan: - Start torsemide  20 mg daily - BMP today - Follow-up in 1 week (if euvolemic at that time, patient can reschedule her EGD/colonoscopy) - Advised to follow-up with cardiology - Consider echocardiogram; did not order today as it is not changing my management of this patient

## 2023-08-09 NOTE — Patient Instructions (Signed)
 Thank you, [name] for allowing us  to provide your care today. Today we discussed:  Leg swelling Start taking torsemide  20 mg everyday Come back in 1 week so we can see how your swelling is doing Make an appointment to see your cardiologist sooner than September We will tell you when you can reschedule your EGD/colonoscopy after the next appointment  I have ordered the following labs for you:   Lab Orders         BMP8+Anion Gap       Referrals ordered today:   Referral Orders  No referral(s) requested today     I have ordered the following medication/changed the following medications:   Stop the following medications: There are no discontinued medications.   Start the following medications: Meds ordered this encounter  Medications   torsemide  (DEMADEX ) 20 MG tablet    Sig: Take 1 tablet (20 mg total) by mouth daily.    Dispense:  30 tablet    Refill:  0     Follow up: 1 week    Should you have any questions or concerns please call the internal medicine clinic at [phone].     Abigail Butler, D.O. [facility]

## 2023-08-10 ENCOUNTER — Ambulatory Visit: Payer: Self-pay | Admitting: Internal Medicine

## 2023-08-10 LAB — BMP8+ANION GAP
Anion Gap: 16 mmol/L (ref 10.0–18.0)
BUN/Creatinine Ratio: 17 (ref 9–23)
BUN: 11 mg/dL (ref 6–24)
CO2: 19 mmol/L — ABNORMAL LOW (ref 20–29)
Calcium: 9.3 mg/dL (ref 8.7–10.2)
Chloride: 106 mmol/L (ref 96–106)
Creatinine, Ser: 0.64 mg/dL (ref 0.57–1.00)
Glucose: 99 mg/dL (ref 70–99)
Potassium: 4.4 mmol/L (ref 3.5–5.2)
Sodium: 141 mmol/L (ref 134–144)
eGFR: 102 mL/min/{1.73_m2} (ref >59)

## 2023-08-10 NOTE — Progress Notes (Signed)
 Called and discussed results with patient's son. Electrolytes and kidney function wnl. I suspect the AGMA is related to lab error.

## 2023-08-16 ENCOUNTER — Other Ambulatory Visit (HOSPITAL_BASED_OUTPATIENT_CLINIC_OR_DEPARTMENT_OTHER): Payer: Self-pay

## 2023-08-16 ENCOUNTER — Encounter: Payer: Self-pay | Admitting: Internal Medicine

## 2023-08-16 ENCOUNTER — Ambulatory Visit: Admitting: Internal Medicine

## 2023-08-16 VITALS — BP 151/57 | HR 53 | Temp 97.8°F | Ht 61.0 in | Wt 200.6 lb

## 2023-08-16 DIAGNOSIS — R079 Chest pain, unspecified: Secondary | ICD-10-CM | POA: Diagnosis not present

## 2023-08-16 DIAGNOSIS — I480 Paroxysmal atrial fibrillation: Secondary | ICD-10-CM

## 2023-08-16 DIAGNOSIS — I5030 Unspecified diastolic (congestive) heart failure: Secondary | ICD-10-CM

## 2023-08-16 DIAGNOSIS — I236 Thrombosis of atrium, auricular appendage, and ventricle as current complications following acute myocardial infarction: Secondary | ICD-10-CM

## 2023-08-16 DIAGNOSIS — R600 Localized edema: Secondary | ICD-10-CM

## 2023-08-16 DIAGNOSIS — A048 Other specified bacterial intestinal infections: Secondary | ICD-10-CM

## 2023-08-16 DIAGNOSIS — R071 Chest pain on breathing: Secondary | ICD-10-CM

## 2023-08-16 DIAGNOSIS — I513 Intracardiac thrombosis, not elsewhere classified: Secondary | ICD-10-CM

## 2023-08-16 NOTE — Assessment & Plan Note (Signed)
 Patient endorses intermittent chest pain she describes as stabbing or squeezing and sometimes radiating to her back. It is not exacerbated by food or exertion. It is sometimes worse with laying down at night. She has a history of GERD and H Pylori infection but does not feel her symptoms are the same as that. She also endorses some feelings of palpitations and tachycardia. Also mentions occasional black flecks in her stool.  EKG performed in clinic shows sinus bradycardia, consistent with metoprolol  50mg  BID. Also some inverted t waves which were present on previous EKG.  I am unsure of the etiology of this patient's atypical chest pain. It is possibly consistent with unstable angina, GERD/H Pylori.  - H Pylori breath test - CBC today - Recommended follow up with cardiology for symptoms of atypical chest pain.

## 2023-08-16 NOTE — Assessment & Plan Note (Signed)
 Patient was previously on warfarin for left atrial appendage, also has history of bioprosthetic mitral valve. She believes her warfarin was discontinued but is not sure. She was recently seen by EP who stated patient is still on warfarin, which is likely an error. I am unsure if she should still be on warfarin or not; regardless, she should be seen by cardiology soon. I recommended bringing up her anticoagulation at her next visit.

## 2023-08-16 NOTE — Assessment & Plan Note (Addendum)
 Patient presents for follow up of lower extremity edema. 1 week ago patient was seen in clinic with bilateral 1+ pitting edema of the lower extremities, dyspnea on exertion, and possible PND. Patient was clinically diagnosed with HFpEF (echo from October 2024 showed no reduced EF) and started on torsemide  20mg .  Today, patient feels her edema has mildly improved. She has trace edema of bilateral lower extremities. She has had good urine output with the torsemide . Weight is the same as last week. She feels her shortness of breath has grown worse in the past 3 months since it started. She has not seen her primary cardiologist in many months.  - Increase torsemide  to 20mg  BID - Recommended making an appointment with cardiology - Follow up in 2 weeks to reevaluate symptoms. Patient may also require reeducation about her pathologies and medications due to language barrier and relatively poor medical knowledge

## 2023-08-16 NOTE — Patient Instructions (Addendum)
 Hello,   Your EKG today showed a regular heart rhythm. I would like you to make an appointment with your cardiologist as well.   Please start taking the torsemide  20mg  twice daily.   I will also test you for H Pylori today. I will send a message with those results.   Thanks,  Dr Esaw Heckler    ???? ????? ?????? ????? ???? ????? ???????? ?? ????? ????. ??? ??? ????? ???? ?? ???? ????? ?????.  ???? ????? ?????? ???? ???????? 20 ??? ????? ??????.  ????? ?? ????? ????? ????? ??????? ?????? (H. Pylori). ????? ?? ????? ????????.  ?????? ????? ????? mrhbaan, 'azhar takhtit kahrabiat alqalb ladayk alyawm antzamaan fi nabadat qalbika. 'awadu mink tahdid maweid mae tabib alqalb aydaan. yurjaa albad' bitanawul dawa' tursimid 20 malaghi maratayn ywmyaan. sa'ujri lak alyawm aydaan fhsaan lijurthumat almaeida (H. Pylori). sa'ursil lak risalatan bialnatayija. shkraan, duktur Laroy Plunk

## 2023-08-16 NOTE — Assessment & Plan Note (Signed)
 Torsemide  increased to 20mg  BID for only slightly improved symptoms since last visit. Recommended making follow up appointment with cardiology for further management.  - BMP today since starting torsemide 

## 2023-08-16 NOTE — Progress Notes (Signed)
 Subjective:  CC: lower extremity edema follow up  HPI:  Abigail Butler is a 58 y.o. female with a past medical history stated below and presents today for above. Please see problem based assessment and plan for additional details.  Past Medical History:  Diagnosis Date   Cervical cancer screening 06/04/2021   Chronic left shoulder pain 03/23/2022   Daily headache    sometimes; often (02/10/2012)   Diabetes mellitus (HCC) 01/03/2023   Dysuria 10/26/2022   GERD (gastroesophageal reflux disease)    H. pylori infection    ?failed standard tx; currently undergoing levofloxacin  salvage tx   H/O: CVA (cerebrovascular accident) 06/01/2013   Right thalamic CVA 2015   Health care maintenance 12/24/2013   Health care maintenance 12/24/2013   Hypertension 05/11/2017   Long term (current) use of anticoagulants 06/22/2021   Lower extremity edema 09/16/2022   Lumbar disc disease with radiculopathy 10/15/2014   Mass of left breast on mammogram 05/06/2015   Migraine without status migrainosus, not intractable 09/16/2022   Mixed dyslipidemia 11/12/2021   PAF (paroxysmal atrial fibrillation) (HCC) 11/12/2021   Prediabetes 10/15/2014   A1c 5.7 on 10/15/14   Right arm pain 10/15/2016   S/P Maze operation for atrial fibrillation 07/17/2021   S/P MVR (mitral valve replacement) 07/17/2021   TB lung, latent 2009   Treated in 2009 by [facility]   Thrombus of left atrial appendage 06/04/2021    Current Outpatient Medications on File Prior to Visit  Medication Sig Dispense Refill   acetaminophen  (TYLENOL ) 500 MG tablet Take 500-1,000 mg by mouth as needed.     amLODipine  (NORVASC ) 2.5 MG tablet Take 1 tablet (2.5 mg total) by mouth daily. 30 tablet 6   aspirin  81 MG EC tablet Take 1 tablet (81 mg total) by mouth daily. Swallow whole. 150 tablet 2   atorvastatin  (LIPITOR) 40 MG tablet Take 1 tablet (40 mg total) by mouth daily. 90 tablet 2   metoprolol  tartrate (LOPRESSOR )  50 MG tablet Take 1 tablet (50 mg total) by mouth 2 (two) times daily. 180 tablet 3   torsemide  (DEMADEX ) 20 MG tablet Take 1 tablet (20 mg total) by mouth daily. 30 tablet 0   No current facility-administered medications on file prior to visit.   Review of Systems: ROS negative except for as is noted on the assessment and plan.  Objective:   Vitals:   08/16/23 1025 08/16/23 1039  BP: (!) 152/52 (!) 151/57  Pulse: (!) 54 (!) 53  Temp:  97.8 F (36.6 C)  TempSrc:  Oral  SpO2: 99%   Weight: 200 lb 9.6 oz (91 kg)   Height: 5' 1 (1.549 m)    Physical Exam: Constitutional: well-appearing, in no acute distress. Appears clinically euvolemic, trace bilateral lower extremity edema. No respiratory distress, clear breath sounds Cardiovascular: regular rate. Bradycardic Pulmonary/Chest: normal work of breathing on room air. No respiratory distress, clear breath sounds MSK: normal bulk and tone  Assessment & Plan:   Lower extremity edema Patient presents for follow up of lower extremity edema. 1 week ago patient was seen in clinic with bilateral 1+ pitting edema of the lower extremities, dyspnea on exertion, and possible PND. Patient was clinically diagnosed with HFpEF (echo from October 2024 showed no reduced EF) and started on torsemide  20mg .  Today, patient feels her edema has mildly improved. She has trace edema of bilateral lower extremities. She has had good urine output with the torsemide . Weight is the same as last  week. She feels her shortness of breath has grown worse in the past 3 months since it started. She has not seen her primary cardiologist in many months.  - Increase torsemide  to 20mg  BID - Recommended making an appointment with cardiology - Follow up in 2 weeks to reevaluate symptoms. Patient may also require reeducation about her pathologies and medications due to language barrier and relatively poor medical knowledge  Chest pain Patient endorses intermittent chest pain  she describes as stabbing or squeezing and sometimes radiating to her back. It is not exacerbated by food or exertion. It is sometimes worse with laying down at night. She has a history of GERD and H Pylori infection but does not feel her symptoms are the same as that. She also endorses some feelings of palpitations and tachycardia. Also mentions occasional black flecks in her stool.  EKG performed in clinic shows sinus bradycardia, consistent with metoprolol  50mg  BID. Also some inverted t waves which were present on previous EKG.  I am unsure of the etiology of this patient's atypical chest pain. It is possibly consistent with unstable angina, GERD/H Pylori.  - H Pylori breath test - CBC today - Recommended follow up with cardiology for symptoms of atypical chest pain.   Thrombus of left atrial appendage Patient was previously on warfarin for left atrial appendage, also has history of bioprosthetic mitral valve. She believes her warfarin was discontinued but is not sure. She was recently seen by EP who stated patient is still on warfarin, which is likely an error. I am unsure if she should still be on warfarin or not; regardless, she should be seen by cardiology soon. I recommended bringing up her anticoagulation at her next visit.   (HFpEF) heart failure with preserved ejection fraction (HCC) Torsemide  increased to 20mg  BID for only slightly improved symptoms since last visit. Recommended making follow up appointment with cardiology for further management.  - BMP today since starting torsemide     Patient discussed with Dr. Tyrell Gallo MD [facility]  PGY-1 Pager: [phone] Date 08/16/2023  Time 1:51 PM

## 2023-08-17 ENCOUNTER — Telehealth: Payer: Self-pay | Admitting: Cardiology

## 2023-08-17 LAB — CBC WITH DIFFERENTIAL/PLATELET
Basophils Absolute: 0.1 10*3/uL (ref 0.0–0.2)
Basos: 1 %
EOS (ABSOLUTE): 0.2 10*3/uL (ref 0.0–0.4)
Eos: 3 %
Hematocrit: 36 % (ref 34.0–46.6)
Hemoglobin: 11.8 g/dL (ref 11.1–15.9)
Immature Grans (Abs): 0 10*3/uL (ref 0.0–0.1)
Immature Granulocytes: 0 %
Lymphocytes Absolute: 2.5 10*3/uL (ref 0.7–3.1)
Lymphs: 35 %
MCH: 28.4 pg (ref 26.6–33.0)
MCHC: 32.8 g/dL (ref 31.5–35.7)
MCV: 87 fL (ref 79–97)
Monocytes Absolute: 0.7 10*3/uL (ref 0.1–0.9)
Monocytes: 10 %
Neutrophils Absolute: 3.5 10*3/uL (ref 1.4–7.0)
Neutrophils: 51 %
Platelets: 205 10*3/uL (ref 150–450)
RBC: 4.16 x10E6/uL (ref 3.77–5.28)
RDW: 13 % (ref 11.7–15.4)
WBC: 7 10*3/uL (ref 3.4–10.8)

## 2023-08-17 LAB — BMP8+ANION GAP
Anion Gap: 15 mmol/L (ref 10.0–18.0)
BUN/Creatinine Ratio: 18 (ref 9–23)
BUN: 12 mg/dL (ref 6–24)
CO2: 21 mmol/L (ref 20–29)
Calcium: 9.4 mg/dL (ref 8.7–10.2)
Chloride: 105 mmol/L (ref 96–106)
Creatinine, Ser: 0.67 mg/dL (ref 0.57–1.00)
Glucose: 100 mg/dL — ABNORMAL HIGH (ref 70–99)
Potassium: 4.7 mmol/L (ref 3.5–5.2)
Sodium: 141 mmol/L (ref 134–144)
eGFR: 101 mL/min/{1.73_m2} (ref >59)

## 2023-08-17 LAB — H. PYLORI BREATH TEST: H pylori Breath Test: NEGATIVE

## 2023-08-17 NOTE — Telephone Encounter (Signed)
 Left vm to return call.

## 2023-08-17 NOTE — Telephone Encounter (Signed)
 Pt c/o of Chest Pain: STAT if active CP, including tightness, pressure, jaw pain, radiating pain to shoulder/upper arm/back, CP unrelieved by Nitro. Symptoms reported of SOB, nausea, vomiting, sweating.  1. Are you having CP right now? No    2. Are you experiencing any other symptoms (ex. SOB, nausea, vomiting, sweating)? Leg edema, fatigue, tightness in neck on the right side   3. Is your CP continuous or coming and going? Coming and going   4. Have you taken Nitroglycerin ? No    5. How long have you been experiencing CP? Has worsened over past week    6. If NO CP at time of call then end call with telling Pt to call back or call 911 if Chest pain returns prior to return call from triage team.   Daughter calling with pt, states their main concern is the swelling in legs. Unsure is pt has gained weight due to the swelling. Requesting sooner appt. Please advise.

## 2023-08-18 NOTE — Progress Notes (Signed)
 Internal Medicine Clinic Attending  Case discussed with the resident at the time of the visit.  We reviewed the resident's history and exam and pertinent patient test results.  I agree with the assessment, diagnosis, and plan of care documented in the resident's note.

## 2023-08-19 NOTE — Telephone Encounter (Signed)
 Called the patients daughter and she reported that she was having pain on her right side of her chest that has been coming and going for the past week. The pain occurs when she coughs. Her daughter also reported that her feet are also swollen. The patient's daughter was asking if an appointment could be scheduled so she could see the doctor. An appointment was scheduled for the patient to see Dr. Ronell Coe at the [facility] office on 09/06/23 at 10:00 am. Patient's daughter was made aware of the appointment and she had no further questions at this time.

## 2023-08-22 ENCOUNTER — Ambulatory Visit: Payer: Self-pay | Admitting: Internal Medicine

## 2023-08-29 ENCOUNTER — Telehealth: Payer: Self-pay | Admitting: *Deleted

## 2023-08-29 NOTE — Telephone Encounter (Signed)
 Copied from CRM [id number]. Topic: Clinical - Lab/Test Results >> Aug 29, 2023  3:58 PM Abigail Butler wrote: Reason for CRM: Pt's daughter [name] called back regarding the pt's lab results from 08/16/23. Please follow up.

## 2023-08-31 ENCOUNTER — Ambulatory Visit: Payer: Self-pay | Admitting: Student

## 2023-09-06 ENCOUNTER — Ambulatory Visit

## 2023-09-07 ENCOUNTER — Ambulatory Visit: Admitting: Student

## 2023-09-07 ENCOUNTER — Telehealth: Payer: Self-pay | Admitting: *Deleted

## 2023-09-07 VITALS — BP 156/72 | HR 54 | Temp 98.2°F | Ht 61.0 in | Wt 200.2 lb

## 2023-09-07 DIAGNOSIS — E119 Type 2 diabetes mellitus without complications: Secondary | ICD-10-CM | POA: Diagnosis present

## 2023-09-07 DIAGNOSIS — I5030 Unspecified diastolic (congestive) heart failure: Secondary | ICD-10-CM

## 2023-09-07 DIAGNOSIS — I10 Essential (primary) hypertension: Secondary | ICD-10-CM

## 2023-09-07 DIAGNOSIS — I110 Hypertensive heart disease with heart failure: Secondary | ICD-10-CM

## 2023-09-07 LAB — POCT GLYCOSYLATED HEMOGLOBIN (HGB A1C): Hemoglobin A1C: 6.4 % — AB (ref 4.0–5.6)

## 2023-09-07 LAB — GLUCOSE, CAPILLARY: Glucose-Capillary: 117 mg/dL — ABNORMAL HIGH (ref 70–99)

## 2023-09-07 MED ORDER — TORSEMIDE 40 MG PO TABS: 40.0000 mg | ORAL_TABLET | Freq: Every day | ORAL | 3 refills | Status: DC

## 2023-09-07 NOTE — Progress Notes (Unsigned)
 CC: HTN follow up  HPI:  [name] is a 58 y.o. female living with a history stated below and presents today for HTN. Please see problem based assessment and plan for additional details.  Past Medical History:  Diagnosis Date   Cervical cancer screening 06/04/2021   Chronic left shoulder pain 03/23/2022   Daily headache    sometimes; often (02/10/2012)   Diabetes mellitus (HCC) 01/03/2023   Dysuria 10/26/2022   GERD (gastroesophageal reflux disease)    H. pylori infection    ?failed standard tx; currently undergoing levofloxacin  salvage tx   H/O: CVA (cerebrovascular accident) 06/01/2013   Right thalamic CVA 2015   Health care maintenance 12/24/2013   Health care maintenance 12/24/2013   Hypertension 05/11/2017   Long term (current) use of anticoagulants 06/22/2021   Lower extremity edema 09/16/2022   Lumbar disc disease with radiculopathy 10/15/2014   Mass of left breast on mammogram 05/06/2015   Migraine without status migrainosus, not intractable 09/16/2022   Mixed dyslipidemia 11/12/2021   PAF (paroxysmal atrial fibrillation) (HCC) 11/12/2021   Prediabetes 10/15/2014   A1c 5.7 on 10/15/14   Right arm pain 10/15/2016   S/P Maze operation for atrial fibrillation 07/17/2021   S/P MVR (mitral valve replacement) 07/17/2021   TB lung, latent 2009   Treated in 2009 by [facility]   Thrombus of left atrial appendage 06/04/2021    Current Outpatient Medications on File Prior to Visit  Medication Sig Dispense Refill   acetaminophen  (TYLENOL ) 500 MG tablet Take 500-1,000 mg by mouth as needed.     amLODipine  (NORVASC ) 2.5 MG tablet Take 1 tablet (2.5 mg total) by mouth daily. 30 tablet 6   aspirin  81 MG EC tablet Take 1 tablet (81 mg total) by mouth daily. Swallow whole. 150 tablet 2   atorvastatin  (LIPITOR) 40 MG tablet Take 1 tablet (40 mg total) by mouth daily. 90 tablet 2   metoprolol  tartrate (LOPRESSOR ) 50 MG tablet Take 1 tablet (50 mg total) by  mouth 2 (two) times daily. 180 tablet 3   No current facility-administered medications on file prior to visit.    Family History  Problem Relation Age of Onset   Colon cancer Neg Hx    Colon polyps Neg Hx    Esophageal cancer Neg Hx    Rectal cancer Neg Hx    Stomach cancer Neg Hx     Social History   Socioeconomic History   Marital status: Married    Spouse name: Not on file   Number of children: 3   Years of education: Not on file   Highest education level: Not on file  Occupational History   Occupation: Homemaker  Tobacco Use   Smoking status: Never    Passive exposure: Never   Smokeless tobacco: Never  Vaping Use   Vaping status: Never Used  Substance and Sexual Activity   Alcohol use: No    Alcohol/week: 0.0 standard drinks of alcohol   Drug use: No   Sexual activity: Not on file  Other Topics Concern   Not on file  Social History Narrative   ** Merged History Encounter **       ** Merged History Encounter **       Social Drivers of Corporate investment banker Strain: Not on file  Food Insecurity: No Food Insecurity (08/09/2023)   Hunger Vital Sign    Worried About Running Out of Food in the Last Year: Never true  Ran Out of Food in the Last Year: Never true  Transportation Needs: No Transportation Needs (08/09/2023)   PRAPARE - Administrator, Civil Service (Medical): No    Lack of Transportation (Non-Medical): No  Physical Activity: Inactive (09/07/2023)   Exercise Vital Sign    Days of Exercise per Week: 0 days    Minutes of Exercise per Session: 0 min  Stress: No Stress Concern Present (08/09/2023)   [employer] of Occupational Health - Occupational Stress Questionnaire    Feeling of Stress : Not at all  Social Connections: Moderately Isolated (08/09/2023)   Social Connection and Isolation Panel    Frequency of Communication with Friends and Family: More than three times a week    Frequency of Social Gatherings with Friends and  Family: More than three times a week    Attends Religious Services: Never    Database administrator or Organizations: No    Attends Banker Meetings: Never    Marital Status: Married  Catering manager Violence: Not At Risk (08/09/2023)   Humiliation, Afraid, Rape, and Kick questionnaire    Fear of Current or Ex-Partner: No    Emotionally Abused: No    Physically Abused: No    Sexually Abused: No    Review of Systems: ROS negative except for what is noted on the assessment and plan.  Vitals:   09/07/23 1450 09/07/23 1509 09/07/23 1514  BP: (!) 166/64 (!) 152/56 (!) 156/72  Pulse: (!) 57 (!) 54 (!) 54  Temp: 98.2 F (36.8 C)    TempSrc: Oral    SpO2: 97%    Weight: 200 lb 3.2 oz (90.8 kg)    Height: 5' 1 (1.549 m)      Physical Exam: Constitutional: well-appearing female  in no acute distress Cardiovascular: regular rate and rhythm, no m/r/g Pulmonary/Chest: normal work of breathing on room air, lungs clear to auscultation bilaterally, no crackles  Abdominal: soft, non-tender, non-distended Skin: warm and dry, +1 pitting edema to shins Psych: normal mood and affect  Assessment & Plan:   (HFpEF) heart failure with preserved ejection fraction (HCC) Pt presents as follow up regarding her lower extremity swelling. She does have a history of HFpEF. At her last visit, her torsemide  was increased to 20mg  BID, however she has only been taking torsemide  once a day. She does still have +1 pitting edema up to the shins bilaterally. I have doubled her dose of torsemide , and will see her back soon. If her blood pressure is still elevated, can consider adding an ARB.  Hypertension Blood pressure elevated today at 156/72. Regimen is currently only Norvasc  2.5mg , and metoprolol  tartrate 50mg . If torsemide  40mg  does not reduce blood pressure, may need to add ARB.  Diabetes mellitus (HCC) Last A1c 6.8, A1c today is 6.4. Not on any anti-hyperglycemic agents, will continue with  lifestyle interventions rather than starting a medication.    Patient discussed with Dr. CHARLENA Rosan Roetta Nelia, M.D. [facility] Medicine, PGY-3 Pager: [phone] Date 09/08/2023 Time 9:10 AM

## 2023-09-07 NOTE — Telephone Encounter (Signed)
 Spoke to patient's daughter who is returning call from 10 days ago.  Patient has an appointment for this afternoon.  Will have patient discuss results then.

## 2023-09-07 NOTE — Patient Instructions (Addendum)
 Thank you so much for coming to the clinic today!   We are increasing your torsemide  to 40mg   I would like to see you back in a week, so we can see how you're doing.   If you have any questions please feel free to the call the clinic at anytime at [phone]. It was a pleasure seeing you!  Best, Dr. Nelia     ????? ?????? ???????? ??????? ?????!  ?. ????? ???? ?????????? ??? ?? ???. ?. ???? ????? ?????? ??? ?????? ???? ??? ????.  ??? ???? ????? ?? ?????? ????? ??????? ???????? ?? ?? ??? ??? ????? ???-???-????. ????? ??????!  ?? ???? ???????? ?. ??? ????? shkran jzylaan liziaratikum aleiadat alyawma! 1. sanazid jureat altuwrsimid 'iilaa 40 malgh. 2. awd ruyatak mjddan baed 'usbuei, linaraa kayf haluka. 'iidha kanat ladaykum 'ayu 'asyilatin, yurja aliatisal bialeiadat fi 'ayi waqt ealaa alraqm [phone]. sirirna biruyatika! mae 'atyab altahiaati, du. nur aldiyn

## 2023-09-08 ENCOUNTER — Encounter: Payer: Self-pay | Admitting: Internal Medicine

## 2023-09-08 ENCOUNTER — Other Ambulatory Visit: Payer: Self-pay | Admitting: Student

## 2023-09-08 ENCOUNTER — Telehealth: Payer: Self-pay | Admitting: *Deleted

## 2023-09-08 DIAGNOSIS — Z1231 Encounter for screening mammogram for malignant neoplasm of breast: Secondary | ICD-10-CM

## 2023-09-08 LAB — MICROALBUMIN / CREATININE URINE RATIO
Creatinine, Urine: 142.7 mg/dL
Microalb/Creat Ratio: 32 mg/g{creat} — ABNORMAL HIGH (ref 0–29)
Microalbumin, Urine: 45.6 ug/mL

## 2023-09-08 NOTE — Telephone Encounter (Signed)
 Mammogram appointmentt:August 5.2025 @ 10:20 a.m. / arrive 10:00 a.m. @ the breast center / appointment mailed with information regarding the $75.00 no show fee. Daughter aware I'm mailing this appointment.

## 2023-09-08 NOTE — Assessment & Plan Note (Signed)
 Last A1c 6.8, A1c today is 6.4. Not on any anti-hyperglycemic agents, will continue with lifestyle interventions rather than starting a medication.

## 2023-09-08 NOTE — Assessment & Plan Note (Signed)
 Pt presents as follow up regarding her lower extremity swelling. She does have a history of HFpEF. At her last visit, her torsemide  was increased to 20mg  BID, however she has only been taking torsemide  once a day. She does still have +1 pitting edema up to the shins bilaterally. I have doubled her dose of torsemide , and will see her back soon. If her blood pressure is still elevated, can consider adding an ARB.

## 2023-09-08 NOTE — Assessment & Plan Note (Addendum)
 Blood pressure elevated today at 156/72. Regimen is currently only Norvasc  2.5mg , and metoprolol  tartrate 50mg . If torsemide  40mg  does not reduce blood pressure, may need to add ARB.

## 2023-09-12 ENCOUNTER — Telehealth: Payer: Self-pay | Admitting: *Deleted

## 2023-09-12 NOTE — Telephone Encounter (Signed)
 Pt saw Dr Nooruddin on 09/07/23.

## 2023-09-12 NOTE — Telephone Encounter (Signed)
 Source  Bambrick, Anaya (Patient)   Subject  Branson, Nyjae (Patient)   Topic  Clinical - Prescription Issue    Communication  Reason for CRM: Patient is needing to requesting a generic brand for the following medication: Torsemide  40 MG TABS    Patient states the pharmacy is asking her to pay $400 for the meds and she is unable to afford it. Provider also wanted to follow up with the patient after 1 week of being on the meds.

## 2023-09-13 ENCOUNTER — Other Ambulatory Visit: Payer: Self-pay | Admitting: Student

## 2023-09-13 ENCOUNTER — Telehealth: Payer: Self-pay

## 2023-09-13 ENCOUNTER — Ambulatory Visit: Payer: Self-pay

## 2023-09-13 DIAGNOSIS — I5030 Unspecified diastolic (congestive) heart failure: Secondary | ICD-10-CM

## 2023-09-13 MED ORDER — TORSEMIDE 20 MG PO TABS: 40.0000 mg | ORAL_TABLET | Freq: Every day | ORAL | 3 refills | Status: AC

## 2023-09-13 NOTE — Telephone Encounter (Signed)
 Called pt - informed pt's daughter the doctor had called the pharmacy this am and changed Torsemide  to generic and to call the pharmacy. And to call back if she has any problems. Stated she understands.

## 2023-09-13 NOTE — Telephone Encounter (Signed)
 FYI Only or Action Required?: Action required by provider: clinical question for provider.  Patient was last seen in primary care on 09/07/2023 by Nooruddin, Saad, MD.  Called Nurse Triage reporting information only.  Symptoms began today.  Interventions attempted: Other: n/a.  Symptoms are: unchanged.  Triage Disposition: Call PCP When Office is Open  Patient/caregiver understands and will follow disposition?: Yes   Copied from CRM [id number]. Topic: Clinical - Prescription Issue >> Sep 12, 2023 12:00 PM Laurier C wrote: Reason for CRM: Patient is needing to requesting a generic brand for the following medication: Torsemide  40 MG TABS Patient states the pharmacy is asking her to pay $400 for the meds and she is unable to afford it. Provider also wanted to follow up with the patient after 1 week of being on the meds. >> Sep 13, 2023  3:11 PM Mercer PEDLAR wrote: Patient calling because she has not heard back and has swelling in her feet. Reason for Disposition  [1] Caller has NON-URGENT medicine question about med that PCP prescribed AND [2] triager unable to answer question  Answer Assessment - Initial Assessment Questions 1. REASON FOR CALL: What is the main reason for your call? or How can I best help you?     Patient would like different medication as current medication is $400/month with her insurance and unaffordable 2. SYMPTOMS : Do you have any symptoms?      No change in symptoms 3. OTHER QUESTIONS: Do you have any other questions?     Please contact patient to advise  Protocols used: Information Only Call - No Triage-A-AH, Medication Question Call-A-AH

## 2023-09-13 NOTE — Telephone Encounter (Signed)
 Prior Authorization for patient (Soaanz  40MG  tablets) came through on cover my meds was submitted with last office notes awaiting approval or denial.  XZB:[id number]

## 2023-09-13 NOTE — Progress Notes (Deleted)
 Abigail Console, PA-C [address] [place], [place]  [address] Phone: [phone]   Primary Care Physician: Abigail Bruckner, DO  Primary Gastroenterologist:  Abigail Console, PA-C / ***  Chief Complaint:  Discuss EGD / Colon       HPI:   Abigail Butler is a 58 y.o. female  Current Outpatient Medications  Medication Sig Dispense Refill   acetaminophen  (TYLENOL ) 500 MG tablet Take 500-1,000 mg by mouth as needed.     amLODipine  (NORVASC ) 2.5 MG tablet Take 1 tablet (2.5 mg total) by mouth daily. 30 tablet 6   aspirin  81 MG EC tablet Take 1 tablet (81 mg total) by mouth daily. Swallow whole. 150 tablet 2   atorvastatin  (LIPITOR) 40 MG tablet Take 1 tablet (40 mg total) by mouth daily. 90 tablet 2   metoprolol  tartrate (LOPRESSOR ) 50 MG tablet Take 1 tablet (50 mg total) by mouth 2 (two) times daily. 180 tablet 3   torsemide  (DEMADEX ) 20 MG tablet Take 2 tablets (40 mg total) by mouth daily. 90 tablet 3   No current facility-administered medications for this visit.    Allergies as of 09/14/2023 - Review Complete 08/16/2023  Allergen Reaction Noted   Pork-derived products Other (See Comments) 04/19/2017    Past Medical History:  Diagnosis Date   Cervical cancer screening 06/04/2021   Chronic left shoulder pain 03/23/2022   Daily headache    sometimes; often (02/10/2012)   Diabetes mellitus (HCC) 01/03/2023   Dysuria 10/26/2022   GERD (gastroesophageal reflux disease)    H. pylori infection    ?failed standard tx; currently undergoing levofloxacin  salvage tx   H/O: CVA (cerebrovascular accident) 06/01/2013   Right thalamic CVA 2015   Health care maintenance 12/24/2013   Health care maintenance 12/24/2013   Hypertension 05/11/2017   Long term (current) use of anticoagulants 06/22/2021   Lower extremity edema 09/16/2022   Lumbar disc disease with radiculopathy 10/15/2014   Mass of left breast on mammogram 05/06/2015   Migraine without status migrainosus, not  intractable 09/16/2022   Mixed dyslipidemia 11/12/2021   PAF (paroxysmal atrial fibrillation) (HCC) 11/12/2021   Prediabetes 10/15/2014   A1c 5.7 on 10/15/14   Right arm pain 10/15/2016   S/P Maze operation for atrial fibrillation 07/17/2021   S/P MVR (mitral valve replacement) 07/17/2021   TB lung, latent 2009   Treated in 2009 by [facility]   Thrombus of left atrial appendage 06/04/2021    Past Surgical History:  Procedure Laterality Date   DILATION AND CURETTAGE OF UTERUS  ~ 2008   cleaned out infection (02/10/2012)   LEFT HEART CATH AND CORONARY ANGIOGRAPHY N/A 04/19/2017   Procedure: LEFT HEART CATH AND CORONARY ANGIOGRAPHY;  Surgeon: Swaziland, Peter M, MD;  Location: [facility] INVASIVE CV LAB;  Service: Cardiovascular;  Laterality: N/A;   MAZE N/A 07/17/2021   Procedure: MAZE;  Surgeon: Lucas Dorise POUR, MD;  Location: [facility] OR;  Service: Open Heart Surgery;  Laterality: N/A;   MITRAL VALVE REPLACEMENT N/A 07/17/2021   Procedure: MITRAL VALVE REPLACEMENT USING 27 MM MITRIS VALVE.  CRYO AND RADIOFREQUENCY MAZE PROCEDURE. LIGATION OF LEFT ATRIAL APPENDAGE.;  Surgeon: Lucas Dorise POUR, MD;  Location: [facility] OR;  Service: Open Heart Surgery;  Laterality: N/A;   TEE WITHOUT CARDIOVERSION N/A 05/27/2021   Procedure: TRANSESOPHAGEAL ECHOCARDIOGRAM (TEE);  Surgeon: Kate Butler CROME, MD;  Location: [facility] ENDOSCOPY;  Service: Cardiovascular;  Laterality: N/A;   TEE WITHOUT CARDIOVERSION N/A 07/17/2021   Procedure: TRANSESOPHAGEAL  ECHOCARDIOGRAM (TEE);  Surgeon: Lucas Dorise POUR, MD;  Location: [facility] OR;  Service: Open Heart Surgery;  Laterality: N/A;    Review of Systems:    All systems reviewed and negative except where noted in HPI.    Physical Exam:  There were no vitals taken for this visit. No LMP recorded. Patient has had a hysterectomy.  General: Well-nourished, well-developed in no acute distress.  Lungs: Clear to auscultation bilaterally. Non-labored. Heart: Regular rate and  rhythm, no murmurs rubs or gallops.  Abdomen: Bowel sounds are normal; Abdomen is Soft; No hepatosplenomegaly, masses or hernias;  No Abdominal Tenderness; No guarding or rebound tenderness. Neuro: Alert and oriented x 3.  Grossly intact.  Psych: Alert and cooperative, normal mood and affect.   Imaging Studies: No results found.  Labs: CBC    Component Value Date/Time   WBC 7.0 08/16/2023 1154   WBC 7.8 03/14/2023 1306   RBC 4.16 08/16/2023 1154   RBC 4.31 03/14/2023 1306   HGB 11.8 08/16/2023 1154   HCT 36.0 08/16/2023 1154   PLT 205 08/16/2023 1154   MCV 87 08/16/2023 1154   MCH 28.4 08/16/2023 1154   MCH 26.8 11/19/2022 2105   MCHC 32.8 08/16/2023 1154   MCHC 33.0 03/14/2023 1306   RDW 13.0 08/16/2023 1154   LYMPHSABS 2.5 08/16/2023 1154   MONOABS 0.7 03/14/2023 1306   EOSABS 0.2 08/16/2023 1154   BASOSABS 0.1 08/16/2023 1154    CMP     Component Value Date/Time   NA 141 08/16/2023 1154   K 4.7 08/16/2023 1154   CL 105 08/16/2023 1154   CO2 21 08/16/2023 1154   GLUCOSE 100 (H) 08/16/2023 1154   GLUCOSE 111 (H) 03/14/2023 1306   BUN 12 08/16/2023 1154   CREATININE 0.67 08/16/2023 1154   CREATININE 0.60 11/02/2013 1638   CALCIUM  9.4 08/16/2023 1154   PROT 7.7 04/29/2023 1014   ALBUMIN  4.2 04/29/2023 1014   AST 22 04/29/2023 1014   ALT 16 04/29/2023 1014   ALKPHOS 127 (H) 04/29/2023 1014   BILITOT 0.4 04/29/2023 1014   GFRNONAA >60 11/19/2022 2105   GFRNONAA >89 11/02/2013 1638   GFRAA 115 04/15/2017 0000   GFRAA >89 11/02/2013 1638       Assessment and Plan:   Abigail Butler is a 58 y.o. y/o female ***    Abigail Console, PA-C  Follow up ***

## 2023-09-13 NOTE — Telephone Encounter (Signed)
 YOU ASKED FOR: Service Description Code 1 Code 2 Plan Requested  Dates Requested  Amount SOAANZ  Tablet [id number] Medicaid 09/13/2023 30 units WE DENIED: Service Description Code 1 Code 2 Plan Denied Dates Denied  Amount SOAANZ  Tablet [id number] Medicaid 09/13/2023 30 units COMMENTS:  The pharmacy is dispensing this drug using a National Drug Code [facility]) that is not part of the Medicaid Drug  Rebate Program. To know more about the program, please refer to the program's website  (ShippingScam.co.uk). Authority Supporting Decision

## 2023-09-14 ENCOUNTER — Ambulatory Visit: Admitting: Physician Assistant

## 2023-09-16 NOTE — Progress Notes (Signed)
 Internal Medicine Clinic Attending  Case discussed with the resident at the time of the visit.  We reviewed the resident's history and exam and pertinent patient test results.  I agree with the assessment, diagnosis, and plan of care documented in the resident's note.

## 2023-09-20 ENCOUNTER — Ambulatory Visit: Admitting: Cardiology

## 2023-09-29 ENCOUNTER — Ambulatory Visit: Payer: Self-pay

## 2023-09-29 NOTE — Telephone Encounter (Signed)
 FYI Only or Action Required?: FYI only for provider.  Patient was last seen in primary care on 09/07/2023 by Nooruddin, Saad, MD.  Called Nurse Triage reporting Dental Pain.  Symptoms began several weeks ago.  Interventions attempted: Nothing.  Symptoms are: unchanged.  Triage Disposition: See PCP When Office is Open (Within 3 Days)  Patient/caregiver understands and will follow disposition?: Yes       FYI Only or Action Required?: FYI only for provider.  Patient was last seen in primary care on 09/07/2023 by Nooruddin, Saad, MD.  Called Nurse Triage reporting Dental Pain.  Symptoms began several weeks ago.  Interventions attempted: Nothing.  Symptoms are: unchanged.  Triage Disposition: See PCP When Office is Open (Within 3 Days)  Patient/caregiver understands and will follow disposition?: Yes     Copied from CRM #[id number]. Topic: Clinical - Red Word Triage >> Sep 29, 2023 10:42 AM Zane F wrote: [facility] that prompted transfer to Nurse Triage:   Teeth are swollen; medication prescribed is not helping Reason for Disposition  [1] MILD-MODERATE mouth pain AND [2] present > 3 days  Answer Assessment - Initial Assessment Questions 1. ONSET: When did your mouth start hurting? (e.g., hours or days ago)      Two months ago 2. SEVERITY: How bad is the pain? (Scale 1-10; or mild, moderate or severe)     mild 3. SORES: Are there any sores or ulcers in the mouth? If Yes, ask: What part of the mouth are the sores in?     States gums are swollen 4. FEVER: Do you have a fever? If Yes, ask: What is your temperature, how was it measured, and when did it start?     no 5. CAUSE: What do you think is causing the mouth pain?     unknown 6. OTHER SYMPTOMS: Do you have any other symptoms? (e.g., difficulty breathing)     no  Protocols used: Mouth Pain-A-AH

## 2023-09-30 ENCOUNTER — Ambulatory Visit: Payer: Self-pay | Admitting: Student

## 2023-09-30 NOTE — Progress Notes (Deleted)
 CC: Dental pain  HPI:  [name] is a 58 y.o. female living with a history stated below and presents today for 1 to 13-month history of dental pain.   Patient was last seen in resident [facility] on 7/10, at that visit torsemide  dose was increased to 20 mg twice daily.   Please see problem based assessment and plan for additional details.  Past Medical History:  Diagnosis Date   Cervical cancer screening 06/04/2021   Chronic left shoulder pain 03/23/2022   Daily headache    sometimes; often (02/10/2012)   Diabetes mellitus (HCC) 01/03/2023   Dysuria 10/26/2022   GERD (gastroesophageal reflux disease)    H. pylori infection    ?failed standard tx; currently undergoing levofloxacin  salvage tx   H/O: CVA (cerebrovascular accident) 06/01/2013   Right thalamic CVA 2015   Health care maintenance 12/24/2013   Health care maintenance 12/24/2013   Hypertension 05/11/2017   Long term (current) use of anticoagulants 06/22/2021   Lower extremity edema 09/16/2022   Lumbar disc disease with radiculopathy 10/15/2014   Mass of left breast on mammogram 05/06/2015   Migraine without status migrainosus, not intractable 09/16/2022   Mixed dyslipidemia 11/12/2021   PAF (paroxysmal atrial fibrillation) (HCC) 11/12/2021   Prediabetes 10/15/2014   A1c 5.7 on 10/15/14   Right arm pain 10/15/2016   S/P Maze operation for atrial fibrillation 07/17/2021   S/P MVR (mitral valve replacement) 07/17/2021   TB lung, latent 2009   Treated in 2009 by [facility] Department   Thrombus of left atrial appendage 06/04/2021    Current Outpatient Medications on File Prior to Visit  Medication Sig Dispense Refill   acetaminophen  (TYLENOL ) 500 MG tablet Take 500-1,000 mg by mouth as needed.     amLODipine  (NORVASC ) 2.5 MG tablet Take 1 tablet (2.5 mg total) by mouth daily. 30 tablet 6   aspirin  81 MG EC tablet Take 1 tablet (81 mg total) by mouth daily. Swallow whole. 150 tablet 2   atorvastatin   (LIPITOR) 40 MG tablet Take 1 tablet (40 mg total) by mouth daily. 90 tablet 2   metoprolol  tartrate (LOPRESSOR ) 50 MG tablet Take 1 tablet (50 mg total) by mouth 2 (two) times daily. 180 tablet 3   torsemide  (DEMADEX ) 20 MG tablet Take 2 tablets (40 mg total) by mouth daily. 90 tablet 3   No current facility-administered medications on file prior to visit.    Review of Systems: ROS negative except for what is noted on the assessment and plan.  There were no vitals filed for this visit. {Labs (Optional):23779} {Vitals History (Optional):23777}  Physical Exam: Constitutional: NAD Cardiovascular: RRR, no murmurs. Pulmonary/Chest: Clear bilateral lungs Abdominal: soft, non-tender, non-distended.  Assessment & Plan:   Patient {GC/GE:[id number]::discussed with,seen with} Dr. {[id number]:[id number]::Tpoopjfd,Z. Hoffman,Chambliss, Winfrey,Lau,Machen}  Assessment & Plan Chronic heart failure with preserved ejection fraction (HCC) Pt presents as follow up regarding her lower extremity swelling. She does have a history of HFpEF. At her last visit, her torsemide  was increased to 20mg  BID, however she has only been taking torsemide  once a day. She does still have +1 pitting edema up to the shins bilaterally. I have doubled her dose of torsemide , and will see her back soon. If her blood pressure is still elevated, can consider adding an ARB.   Dental pain: ?   Health maintenance screening. - Mammogram scheduled 8/5. - pap smear the next visit.  No orders of the defined types were placed in this encounter.   Abigail Vultaggio,  MD [facility] Internal Medicine, PGY-2  Date 09/30/2023 Time 8:36 AM

## 2023-09-30 NOTE — Assessment & Plan Note (Deleted)
 Pt presents as follow up regarding her lower extremity swelling. She does have a history of HFpEF. At her last visit, her torsemide  was increased to 20mg  BID, however she has only been taking torsemide  once a day. She does still have +1 pitting edema up to the shins bilaterally. I have doubled her dose of torsemide , and will see her back soon. If her blood pressure is still elevated, can consider adding an ARB.   Dental pain: ?   Health maintenance screening. - Mammogram scheduled 8/5. - pap smear the next visit.

## 2023-09-30 NOTE — Telephone Encounter (Signed)
 Pt has resch:  Name: [name], Abigail Butler  Date: 10/03/2023 Status: Sch  Time: 10:45 AM Length: 30  Visit Type: ACUTE [8007] Copay: $0.00  Provider: Marylu Gee, DO       Copied from CRM [id number]. Topic: General - Running Late >> Sep 30, 2023 10:30 AM Diannia H wrote: Patient/patient representative is calling because they are running late for an appointment the patient thought her appointment was at 10:30 and I informed the patient it was at 10:15. I explained they were passed the grace period but could come and it would be up to the provider if they will see her. I explained that its a possibility they would not because she's already so late.

## 2023-10-03 ENCOUNTER — Ambulatory Visit (INDEPENDENT_AMBULATORY_CARE_PROVIDER_SITE_OTHER): Admitting: Student

## 2023-10-03 VITALS — BP 142/67 | HR 52 | Ht 61.0 in | Wt 197.4 lb

## 2023-10-03 DIAGNOSIS — I110 Hypertensive heart disease with heart failure: Secondary | ICD-10-CM | POA: Diagnosis present

## 2023-10-03 DIAGNOSIS — I10 Essential (primary) hypertension: Secondary | ICD-10-CM

## 2023-10-03 DIAGNOSIS — I5032 Chronic diastolic (congestive) heart failure: Secondary | ICD-10-CM

## 2023-10-03 MED ORDER — AMLODIPINE BESYLATE 2.5 MG PO TABS: 2.5000 mg | ORAL_TABLET | Freq: Every day | ORAL | 6 refills | Status: DC

## 2023-10-03 MED ORDER — METOPROLOL TARTRATE 50 MG PO TABS: 50.0000 mg | ORAL_TABLET | Freq: Two times a day (BID) | ORAL | 3 refills | Status: AC

## 2023-10-03 NOTE — Assessment & Plan Note (Signed)
 BP is 159/54 and 142/67. Managed with Norvasc  2.5 mg/daily, Metoprolol  50 mg BID, and Torsemide  20 mg daily. Continue current regiment with exception of Torsemide  (see plan for HFpEF)

## 2023-10-03 NOTE — Progress Notes (Unsigned)
 CC: Follow-up  HPI:  [name] is a 58 y.o. female living with a history stated below and presents today for follow-up. Please see problem based assessment and plan for additional details.  Past Medical History:  Diagnosis Date   Cervical cancer screening 06/04/2021   Chronic left shoulder pain 03/23/2022   Daily headache    sometimes; often (02/10/2012)   Diabetes mellitus (HCC) 01/03/2023   Dysuria 10/26/2022   GERD (gastroesophageal reflux disease)    H. pylori infection    ?failed standard tx; currently undergoing levofloxacin  salvage tx   H/O: CVA (cerebrovascular accident) 06/01/2013   Right thalamic CVA 2015   Health care maintenance 12/24/2013   Health care maintenance 12/24/2013   Hypertension 05/11/2017   Long term (current) use of anticoagulants 06/22/2021   Lower extremity edema 09/16/2022   Lumbar disc disease with radiculopathy 10/15/2014   Mass of left breast on mammogram 05/06/2015   Migraine without status migrainosus, not intractable 09/16/2022   Mixed dyslipidemia 11/12/2021   PAF (paroxysmal atrial fibrillation) (HCC) 11/12/2021   Prediabetes 10/15/2014   A1c 5.7 on 10/15/14   Right arm pain 10/15/2016   S/P Maze operation for atrial fibrillation 07/17/2021   S/P MVR (mitral valve replacement) 07/17/2021   TB lung, latent 2009   Treated in 2009 by [facility] Department   Thrombus of left atrial appendage 06/04/2021    Current Outpatient Medications on File Prior to Visit  Medication Sig Dispense Refill   acetaminophen  (TYLENOL ) 500 MG tablet Take 500-1,000 mg by mouth as needed.     aspirin  81 MG EC tablet Take 1 tablet (81 mg total) by mouth daily. Swallow whole. 150 tablet 2   atorvastatin  (LIPITOR) 40 MG tablet Take 1 tablet (40 mg total) by mouth daily. 90 tablet 2   torsemide  (DEMADEX ) 20 MG tablet Take 2 tablets (40 mg total) by mouth daily. 90 tablet 3   No current facility-administered medications on file prior to visit.     Family History  Problem Relation Age of Onset   Colon cancer Neg Hx    Colon polyps Neg Hx    Esophageal cancer Neg Hx    Rectal cancer Neg Hx    Stomach cancer Neg Hx     Social History   Socioeconomic History   Marital status: Married    Spouse name: Not on file   Number of children: 3   Years of education: Not on file   Highest education level: Not on file  Occupational History   Occupation: Homemaker  Tobacco Use   Smoking status: Never    Passive exposure: Never   Smokeless tobacco: Never  Vaping Use   Vaping status: Never Used  Substance and Sexual Activity   Alcohol use: No    Alcohol/week: 0.0 standard drinks of alcohol   Drug use: No   Sexual activity: Not on file  Other Topics Concern   Not on file  Social History Narrative   ** Merged History Encounter **       ** Merged History Encounter **       Social Drivers of Corporate investment banker Strain: Not on file  Food Insecurity: No Food Insecurity (08/09/2023)   Hunger Vital Sign    Worried About Running Out of Food in the Last Year: Never true    Ran Out of Food in the Last Year: Never true  Transportation Needs: No Transportation Needs (08/09/2023)   PRAPARE - Transportation  Lack of Transportation (Medical): No    Lack of Transportation (Non-Medical): No  Physical Activity: Inactive (09/07/2023)   Exercise Vital Sign    Days of Exercise per Week: 0 days    Minutes of Exercise per Session: 0 min  Stress: No Stress Concern Present (08/09/2023)   [employer] of Occupational Health - Occupational Stress Questionnaire    Feeling of Stress : Not at all  Social Connections: Moderately Isolated (08/09/2023)   Social Connection and Isolation Panel    Frequency of Communication with Friends and Family: More than three times a week    Frequency of Social Gatherings with Friends and Family: More than three times a week    Attends Religious Services: Never    Database administrator or  Organizations: No    Attends Banker Meetings: Never    Marital Status: Married  Catering manager Violence: Not At Risk (08/09/2023)   Humiliation, Afraid, Rape, and Kick questionnaire    Fear of Current or Ex-Partner: No    Emotionally Abused: No    Physically Abused: No    Sexually Abused: No    Review of Systems: ROS negative except for what is noted on the assessment and plan.  Vitals:   10/03/23 1058 10/03/23 1107  BP: (!) 159/54 (!) 142/67  Pulse: 60 (!) 52  SpO2: 99%   Weight: 197 lb 6.4 oz (89.5 kg)   Height: 5' 1 (1.549 m)     Physical Exam: Constitutional: well-appearing, sitting in chair, in no acute distress Cardiovascular: regular rate and rhythm, no m/r/g, 2+ LEE bilaterally, no JVD Pulmonary/Chest: normal work of breathing on room air, lungs clear to auscultation bilaterally MSK: normal bulk and tone Skin: warm and dry  Assessment & Plan:   Patient discussed with Dr. Lovie  Hypertension BP is 159/54 and 142/67. Managed with Norvasc  2.5 mg/daily, Metoprolol  50 mg BID, and Torsemide  20 mg daily. Continue current regiment with exception of Torsemide  (see plan for HFpEF)  (HFpEF) heart failure with preserved ejection fraction (HCC) As noted on 2024 TTE. Managed with above medications.  Her torsemide  20 mg daily was increased to 40 mg daily last office visit in 08/2023.  The plan was unfortunately lost in translation as patient has continued to take 20 mg daily.  I extensively counseled on the updated plan with patient and daughter who was in the room.  She continues to have LEE with mild SOB.  Denies orthopnea/PND.  Lungs are clear.  Will use the 40 mg daily dose to combat both hypervolemia and hypertension.  She is mildly bradycardic today so I have also instructed her to record her heart rate and blood pressure and she will return for follow-up next week.  Counseled to hold metoprolol  if her heart rate is below 60.  I will further consider medication  adjustments at follow-up.   Norman Lobstein, D.O. [facility] Internal Medicine, PGY-2 Phone: [phone] Date 10/04/2023 Time 7:50 AM

## 2023-10-03 NOTE — Patient Instructions (Signed)
 Punten angkat ka departemn palayanan sosial ngeunaan dokumn cacad  Punten parios tekanan darah sareng denyut jantung anjeun sareng tuliskeun angka-angka ieu sareng bawa ieu ka janjian minggu payun  Punten nyandak torsemide  anjeun 40 mg (dua pil) unggal dinten  Punten Entong nyandak Metoprolol  anjeun nyata denyut jantung anjeun kirang ti 60 ketukan per [facility]

## 2023-10-04 ENCOUNTER — Ambulatory Visit
Admission: RE | Admit: 2023-10-04 | Discharge: 2023-10-04 | Disposition: A | Discharge status: Home / Self Care | Source: Ambulatory Visit | Attending: Internal Medicine | Admitting: Internal Medicine

## 2023-10-04 DIAGNOSIS — Z1231 Encounter for screening mammogram for malignant neoplasm of breast: Secondary | ICD-10-CM

## 2023-10-04 NOTE — Addendum Note (Signed)
 Addended by: Nekhi Liwanag L on: 10/04/2023 08:48 AM   Modules accepted: Level of Service

## 2023-10-04 NOTE — Progress Notes (Signed)
 Internal Medicine Clinic Attending  Case discussed with the resident at the time of the visit.  We reviewed the resident's history and exam and pertinent patient test results.  I agree with the assessment, diagnosis, and plan of care documented in the resident's note.    The language barrier has really affected how Abigail Butler understands her care plan and adheres to medicines. I agree with close followup with same provider (Dr Marylu) in 1 week to see how blood pressure & weight are doing with the appropriately increased dose of torsemide 

## 2023-10-04 NOTE — Assessment & Plan Note (Signed)
 As noted on 2024 TTE. Managed with above medications.  Her torsemide  20 mg daily was increased to 40 mg daily last office visit in 08/2023.  The plan was unfortunately lost in translation as patient has continued to take 20 mg daily.  I extensively counseled on the updated plan with patient and daughter who was in the room.  She continues to have LEE with mild SOB.  Denies orthopnea/PND.  Lungs are clear.  Will use the 40 mg daily dose to combat both hypervolemia and hypertension.  She is mildly bradycardic today so I have also instructed her to record her heart rate and blood pressure and she will return for follow-up next week.  Counseled to hold metoprolol  if her heart rate is below 60.  I will further consider medication adjustments at follow-up.

## 2023-10-10 ENCOUNTER — Encounter: Admitting: Student

## 2023-10-11 ENCOUNTER — Encounter: Admitting: Internal Medicine

## 2023-11-01 ENCOUNTER — Other Ambulatory Visit: Payer: Self-pay

## 2023-11-03 ENCOUNTER — Encounter: Payer: Self-pay | Admitting: Cardiology

## 2023-11-03 ENCOUNTER — Ambulatory Visit: Attending: Cardiology | Admitting: Cardiology

## 2023-11-03 VITALS — BP 164/78 | HR 54 | Ht 65.0 in | Wt 197.1 lb

## 2023-11-03 DIAGNOSIS — Z8679 Personal history of other diseases of the circulatory system: Secondary | ICD-10-CM | POA: Insufficient documentation

## 2023-11-03 DIAGNOSIS — Z9889 Other specified postprocedural states: Secondary | ICD-10-CM | POA: Insufficient documentation

## 2023-11-03 DIAGNOSIS — E0800 Diabetes mellitus due to underlying condition with hyperosmolarity without nonketotic hyperglycemic-hyperosmolar coma (NKHHC): Secondary | ICD-10-CM | POA: Insufficient documentation

## 2023-11-03 DIAGNOSIS — E782 Mixed hyperlipidemia: Secondary | ICD-10-CM | POA: Diagnosis present

## 2023-11-03 DIAGNOSIS — Z952 Presence of prosthetic heart valve: Secondary | ICD-10-CM | POA: Insufficient documentation

## 2023-11-03 DIAGNOSIS — I480 Paroxysmal atrial fibrillation: Secondary | ICD-10-CM | POA: Insufficient documentation

## 2023-11-03 MED ORDER — AMLODIPINE BESYLATE 5 MG PO TABS: 5.0000 mg | ORAL_TABLET | Freq: Every day | ORAL | 3 refills | Status: AC

## 2023-11-03 NOTE — Patient Instructions (Signed)
 Medication Instructions:  Your physician has recommended you make the following change in your medication:   START: Amlodipine  5 mg daily  *If you need a refill on your cardiac medications before your next appointment, please call your pharmacy*  Lab Work: None If you have labs (blood work) drawn today and your tests are completely normal, you will receive your results only by: MyChart Message (if you have MyChart) OR A paper copy in the mail If you have any lab test that is abnormal or we need to change your treatment, we will call you to review the results.  Testing/Procedures: None  Follow-Up: At [facility], you and your health needs are our priority.  As part of our continuing mission to provide you with exceptional heart care, our providers are all part of one team.  This team includes your primary Cardiologist (physician) and Advanced Practice Providers or APPs (Physician Assistants and Nurse Practitioners) who all work together to provide you with the care you need, when you need it.  Your next appointment:   9 month(s)  Provider:   Jennifer Crape, MD    We recommend signing up for the patient portal called MyChart.  Sign up information is provided on this After Visit Summary.  MyChart is used to connect with patients for Virtual Visits (Telemedicine).  Patients are able to view lab/test results, encounter notes, upcoming appointments, etc.  Non-urgent messages can be sent to your provider as well.   To learn more about what you can do with MyChart, go to [email or web address].   Other Instructions Please keep a BP log for 1 week and send by MyChart or mail.                          Name and DOB__________________________ Dr. Crape [address] [place], [place] [address]  Blood Pressure Record Sheet To take your blood pressure, you will need a blood pressure machine. You can buy a blood pressure machine (blood pressure monitor) at your clinic, drug store, or  online. When choosing one, consider: An automatic monitor that has an arm cuff. A cuff that wraps snugly around your upper arm. You should be able to fit only one finger between your arm and the cuff. A device that stores blood pressure reading results. Do not choose a monitor that measures your blood pressure from your wrist or finger. Follow your health care provider's instructions for how to take your blood pressure. To use this form: Get one reading in the morning (a.m.) 1-2 hours after you take any medicines. Get one reading in the evening (p.m.) before supper.   Blood pressure log Date: _______________________  a.m. _____________________(1st reading) HR___________            p.m. _____________________(2nd reading) HR__________  Date: _______________________  a.m. _____________________(1st reading) HR___________            p.m. _____________________(2nd reading) HR__________  Date: _______________________  a.m. _____________________(1st reading) HR___________            p.m. _____________________(2nd reading) HR__________  Date: _______________________  a.m. _____________________(1st reading) HR___________            p.m. _____________________(2nd reading) HR__________  Date: _______________________  a.m. _____________________(1st reading) HR___________            p.m. _____________________(2nd reading) HR__________  Date: _______________________  a.m. _____________________(1st reading) HR___________            p.m. _____________________(2nd reading) HR__________  Date: _______________________  a.m. _____________________(1st reading) HR___________            p.m. _____________________(2nd reading) HR__________   This information is not intended to replace advice given to you by your health care provider. Make sure you discuss any questions you have with your health care provider. Document Revised: 06/06/2019 Document Reviewed: 06/06/2019 Elsevier Patient  Education  2021 [employer].

## 2023-11-03 NOTE — Progress Notes (Signed)
 Cardiology Office Note:    Date:  11/03/2023   ID:  Abigail Butler, DOB [birth year], MRN [id number]  PCP:  Harrie Bruckner, DO  Cardiologist:  Jennifer JONELLE Crape, MD   Referring MD: Harrie Bruckner, DO    ASSESSMENT:    1. PAF (paroxysmal atrial fibrillation) (HCC)   2. Diabetes mellitus due to underlying condition with hyperosmolarity without coma, without long-term current use of insulin  (HCC)   3. S/P Maze operation for atrial fibrillation   4. S/P MVR (mitral valve replacement)   5. Mixed dyslipidemia    PLAN:    In order of problems listed above:  Primary prevention stressed with the patient.  Importance of compliance with diet medication stressed and patient verbalized standing. History of atrial fibrillation: Post maze procedure and left atrial clipping.  Therefore patient not on anticoagulation. Essential hypertension: Blood pressure stable but elevated and I take increased amlodipine .  Diet and salt intake issues were discussed.  She will keep a track of her blood pressures at home and send it back to us  in a week. Substernal chest burning sensation.  Does not occur with exertion.  I discussed findings with the patient at length.  She had her completely normal coronary angiography in 2019 does not seek any reevaluation for this.  She clearly mentions that there is no radiation of this pain and also that on ambulation or stress she has no reproduction of the symptoms.  She prefers Risk analyst. Patient will be seen in follow-up appointment in 6 months or earlier if the patient has any concerns.    Medication Adjustments/Labs and Tests Ordered: Current medicines are reviewed at length with the patient today.  Concerns regarding medicines are outlined above.  No orders of the defined types were placed in this encounter.  No orders of the defined types were placed in this encounter.    No chief complaint on file.    History of Present Illness:    Abigail Butler  is a 58 y.o. female.  Patient has past medical history of paroxysmal atrial fibrillation post maze procedure and left atrial appendage clipping.  She has seen electrophysiology in the past.  She denies any problems at this time and takes care of activities of daily living.  She ambulates some on a regular basis.  At the time of my evaluation, the patient is alert awake oriented and in no distress.  There was an official interpreter was present throughout the visit.  She mentions to me and her daughter is also present and they said that her blood pressure is elevated at home like it is here.  She gives history of some substernal burning but she says that this does not exert with exertion.  At the time of my evaluation, the patient is alert awake oriented and in no distress.  Past Medical History:  Diagnosis Date   (HFpEF) heart failure with preserved ejection fraction (HCC) 08/09/2023   Cervical cancer screening 06/04/2021   Chest pain 04/07/2017   Chronic left shoulder pain 03/23/2022   Daily headache    sometimes; often (02/10/2012)   Diabetes mellitus (HCC) 01/03/2023   Dysuria 10/26/2022   GERD (gastroesophageal reflux disease)    H. pylori infection    ?failed standard tx; currently undergoing levofloxacin  salvage tx   H/O: CVA (cerebrovascular accident) 06/01/2013   Right thalamic CVA 2015   Health care maintenance 12/24/2013   Health care maintenance 12/24/2013   Hypertension 05/11/2017   Long term (current) use of anticoagulants 06/22/2021  Lower extremity edema 09/16/2022   Lumbar disc disease with radiculopathy 10/15/2014   Mass of left breast on mammogram 05/06/2015   Migraine without status migrainosus, not intractable 09/16/2022   Mixed dyslipidemia 11/12/2021   PAF (paroxysmal atrial fibrillation) (HCC) 11/12/2021   Prediabetes 10/15/2014   A1c 5.7 on 10/15/14   Right arm pain 10/15/2016   S/P Maze operation for atrial fibrillation 07/17/2021   S/P MVR (mitral valve  replacement) 07/17/2021   TB lung, latent 2009   Treated in 2009 by [facility]   Thrombus of left atrial appendage 06/04/2021   Urinary tract infection without hematuria 07/13/2013   Vaginal itching 01/03/2023    Past Surgical History:  Procedure Laterality Date   DILATION AND CURETTAGE OF UTERUS  ~ 2008   cleaned out infection (02/10/2012)   LEFT HEART CATH AND CORONARY ANGIOGRAPHY N/A 04/19/2017   Procedure: LEFT HEART CATH AND CORONARY ANGIOGRAPHY;  Surgeon: Swaziland, Peter M, MD;  Location: [facility] INVASIVE CV LAB;  Service: Cardiovascular;  Laterality: N/A;   MAZE N/A 07/17/2021   Procedure: MAZE;  Surgeon: Lucas Dorise POUR, MD;  Location: [facility] OR;  Service: Open Heart Surgery;  Laterality: N/A;   MITRAL VALVE REPLACEMENT N/A 07/17/2021   Procedure: MITRAL VALVE REPLACEMENT USING 27 MM MITRIS VALVE.  CRYO AND RADIOFREQUENCY MAZE PROCEDURE. LIGATION OF LEFT ATRIAL APPENDAGE.;  Surgeon: Lucas Dorise POUR, MD;  Location: [facility] OR;  Service: Open Heart Surgery;  Laterality: N/A;   TEE WITHOUT CARDIOVERSION N/A 05/27/2021   Procedure: TRANSESOPHAGEAL ECHOCARDIOGRAM (TEE);  Surgeon: Kate Lonni CROME, MD;  Location: [facility] ENDOSCOPY;  Service: Cardiovascular;  Laterality: N/A;   TEE WITHOUT CARDIOVERSION N/A 07/17/2021   Procedure: TRANSESOPHAGEAL ECHOCARDIOGRAM (TEE);  Surgeon: Lucas Dorise POUR, MD;  Location: [facility] OR;  Service: Open Heart Surgery;  Laterality: N/A;    Current Medications: Current Meds  Medication Sig   acetaminophen  (TYLENOL ) 500 MG tablet Take 500-1,000 mg by mouth as needed for mild pain (pain score 1-3) or moderate pain (pain score 4-6).   amLODipine  (NORVASC ) 2.5 MG tablet Take 1 tablet (2.5 mg total) by mouth daily.   aspirin  81 MG EC tablet Take 1 tablet (81 mg total) by mouth daily. Swallow whole.   atorvastatin  (LIPITOR) 40 MG tablet Take 1 tablet (40 mg total) by mouth daily.   metoprolol  tartrate (LOPRESSOR ) 50 MG tablet Take 1 tablet (50 mg total) by mouth 2  (two) times daily.   torsemide  (DEMADEX ) 20 MG tablet Take 2 tablets (40 mg total) by mouth daily.     Allergies:   Pork-derived products   Social History   Socioeconomic History   Marital status: Married    Spouse name: Not on file   Number of children: 3   Years of education: Not on file   Highest education level: Not on file  Occupational History   Occupation: Homemaker  Tobacco Use   Smoking status: Never    Passive exposure: Never   Smokeless tobacco: Never  Vaping Use   Vaping status: Never Used  Substance and Sexual Activity   Alcohol use: No    Alcohol/week: 0.0 standard drinks of alcohol   Drug use: No   Sexual activity: Not on file  Other Topics Concern   Not on file  Social History Narrative   ** Merged History Encounter **       ** Merged History Encounter **       Social Drivers of Corporate investment banker Strain: Not on  file  Food Insecurity: No Food Insecurity (08/09/2023)   Hunger Vital Sign    Worried About Running Out of Food in the Last Year: Never true    Ran Out of Food in the Last Year: Never true  Transportation Needs: No Transportation Needs (08/09/2023)   PRAPARE - Administrator, Civil Service (Medical): No    Lack of Transportation (Non-Medical): No  Physical Activity: Inactive (09/07/2023)   Exercise Vital Sign    Days of Exercise per Week: 0 days    Minutes of Exercise per Session: 0 min  Stress: No Stress Concern Present (08/09/2023)   [employer] of Occupational Health - Occupational Stress Questionnaire    Feeling of Stress : Not at all  Social Connections: Moderately Isolated (08/09/2023)   Social Connection and Isolation Panel    Frequency of Communication with Friends and Family: More than three times a week    Frequency of Social Gatherings with Friends and Family: More than three times a week    Attends Religious Services: Never    Database administrator or Organizations: No    Attends Hospital doctor: Never    Marital Status: Married     Family History: The patient's family history is negative for Colon cancer, Colon polyps, Esophageal cancer, Rectal cancer, and Stomach cancer.  ROS:   Please see the history of present illness.    All other systems reviewed and are negative.  EKGs/Labs/Other Studies Reviewed:    The following studies were reviewed today: .SABRA      Recent Labs: 04/29/2023: ALT 16; Magnesium  2.0 08/16/2023: BUN 12; Creatinine, Ser 0.67; Hemoglobin 11.8; Platelets 205; Potassium 4.7; Sodium 141  Recent Lipid Panel    Component Value Date/Time   CHOL 177 06/04/2021 0949   TRIG 173 (H) 06/04/2021 0949   HDL 32 (L) 06/04/2021 0949   CHOLHDL 5.5 (H) 06/04/2021 0949   CHOLHDL 5.3 02/10/2012 0520   VLDL 24 02/10/2012 0520   LDLCALC 114 (H) 06/04/2021 0949    Physical Exam:    VS:  BP (!) 164/78   Pulse (!) 54   Ht 5' 5 (1.651 m)   Wt 197 lb 1.9 oz (89.4 kg)   SpO2 97%   BMI 32.80 kg/m     Wt Readings from Last 3 Encounters:  11/03/23 197 lb 1.9 oz (89.4 kg)  10/03/23 197 lb 6.4 oz (89.5 kg)  09/07/23 200 lb 3.2 oz (90.8 kg)     GEN: Patient is in no acute distress HEENT: Normal NECK: No JVD; No carotid bruits LYMPHATICS: No lymphadenopathy CARDIAC: Hear sounds regular, 2/6 systolic murmur at the apex. RESPIRATORY:  Clear to auscultation without rales, wheezing or rhonchi  ABDOMEN: Soft, non-tender, non-distended MUSCULOSKELETAL:  No edema; No deformity  SKIN: Warm and dry NEUROLOGIC:  Alert and oriented x 3 PSYCHIATRIC:  Normal affect   Signed, Jennifer JONELLE Crape, MD  11/03/2023 11:20 AM    [facility]

## 2023-11-22 ENCOUNTER — Ambulatory Visit: Payer: Self-pay | Admitting: *Deleted

## 2023-11-22 NOTE — Telephone Encounter (Signed)
 I talked to pt's family member - informed no appts until next week. Informed pt needs to go to UC or ED for pt's c/o head pressure; stated ok. Also she stated pt  wants to schedule f/u visit appt.  I told her we can schedule f/u appt for next week; but pt's c/o head pressure,h/a , she needs to go to [facility] / ER - she stated ok. F/U appt scheduled 10/1.

## 2023-11-22 NOTE — Telephone Encounter (Signed)
 FYI Only or Action Required?: Action required by provider: request for appointment.  Patient was last seen in primary care on 10/03/2023 by Marylu Gee, DO.  Called Nurse Triage reporting head pressure and Altered Mental Status.  Symptoms began a week ago.  Interventions attempted: OTC medications: Tylenol .  Symptoms are: gradually worsening.  Triage Disposition: Go to ED Now (or PCP Triage)  Patient/caregiver understands and will follow disposition?: No, refuses disposition   Reason for Disposition  Headache  Answer Assessment - Initial Assessment Questions Interpreter: [place] Unable to hear patient well due to children/others in background, Patient unclear on some of her answers- hard to get clear answers for triage questions. Patient states no head pain- pressure/tightness in head- denies other symptoms- vision changes, weakness, numbness. Confusion- patient would not clarify her symptoms- main concern is head pressure.  Patient wants appointment- advised UC- patient will not go- no transportation. Advised ED for increasing symptoms. Patient wants office appointment- will send message to see if PCP will see her this week- she states she can find transportation- to office.    1. SYMPTOM: What is the main symptom you are concerned about? (e.g., weakness, numbness)     Head pressure, anxious, confusion 2. ONSET: When did this start? (e.g., minutes, hours, days; while sleeping)     1 week 3. LAST NORMAL: When was the last time you (the patient) were normal (no symptoms)?     I am barely living  4. PATTERN Does this come and go, or has it been constant since it started?  Is it present now?     Comes and goes 5. CARDIAC SYMPTOMS: Have you had any of the following symptoms: chest pain, difficulty breathing, palpitations?     Originally problems in heart- some days has pain in chest 6. NEUROLOGIC SYMPTOMS: Have you had any of the following symptoms: headache,  dizziness, vision loss, double vision, changes in speech, unsteady on your feet?     Normally- dizziness, itching in eyes 7. OTHER SYMPTOMS: Do you have any other symptoms?     Pressure in her head  Protocols used: [facility]   Copied from CRM #[id number]. Topic: Clinical - Red Word Triage >> Nov 22, 2023  3:35 PM Brittney F wrote: Red Word that prompted transfer to Nurse Triage:   Concern: Tightness in her head  Symptoms:   - Feels like her head is heavy - Anxious  - Confusion  When did the symptoms start?: a week now   What have you done to aid in the concern ? Have you taken anything to assist with the matter?: Yes    If so, what did you take?: Tylenol   but seen no relief

## 2023-11-30 ENCOUNTER — Ambulatory Visit: Admitting: Student

## 2023-11-30 NOTE — Progress Notes (Deleted)
 CC: ***  HPI:  [name] is a 58 y.o. female with past medical history of HFpEF, hypertension, migraines, A-fib who presents for follow-up appointment.  Please see assessment and plan for full HPI.  Medications: HFpEF: Metoprolol  tartrate 50 mg twice daily, aspirin  81 mg daily, torsemide  40 mg daily Hyperlipidemia: Lipitor 40 mg daily Hypertension: Amlodipine  5 mg daily  Patient is status post maze operation  Last seen in our clinic on 10/03/23.  Flu Tdap Pneumococcal PAP Colonoscopy    Past Medical History:  Diagnosis Date   (HFpEF) heart failure with preserved ejection fraction (HCC) 08/09/2023   Cervical cancer screening 06/04/2021   Chest pain 04/07/2017   Chronic left shoulder pain 03/23/2022   Daily headache    sometimes; often (02/10/2012)   Diabetes mellitus (HCC) 01/03/2023   Dysuria 10/26/2022   GERD (gastroesophageal reflux disease)    H. pylori infection    ?failed standard tx; currently undergoing levofloxacin  salvage tx   H/O: CVA (cerebrovascular accident) 06/01/2013   Right thalamic CVA 2015   Health care maintenance 12/24/2013   Health care maintenance 12/24/2013   Hypertension 05/11/2017   Long term (current) use of anticoagulants 06/22/2021   Lower extremity edema 09/16/2022   Lumbar disc disease with radiculopathy 10/15/2014   Mass of left breast on mammogram 05/06/2015   Migraine without status migrainosus, not intractable 09/16/2022   Mixed dyslipidemia 11/12/2021   PAF (paroxysmal atrial fibrillation) (HCC) 11/12/2021   Prediabetes 10/15/2014   A1c 5.7 on 10/15/14   Right arm pain 10/15/2016   S/P Maze operation for atrial fibrillation 07/17/2021   S/P MVR (mitral valve replacement) 07/17/2021   TB lung, latent 2009   Treated in 2009 by [facility]   Thrombus of left atrial appendage 06/04/2021   Urinary tract infection without hematuria 07/13/2013   Vaginal itching 01/03/2023     Current Outpatient  Medications:    acetaminophen  (TYLENOL ) 500 MG tablet, Take 500-1,000 mg by mouth as needed for mild pain (pain score 1-3) or moderate pain (pain score 4-6)., Disp: , Rfl:    amLODipine  (NORVASC ) 5 MG tablet, Take 1 tablet (5 mg total) by mouth daily., Disp: 90 tablet, Rfl: 3   aspirin  81 MG EC tablet, Take 1 tablet (81 mg total) by mouth daily. Swallow whole., Disp: 150 tablet, Rfl: 2   atorvastatin  (LIPITOR) 40 MG tablet, Take 1 tablet (40 mg total) by mouth daily., Disp: 90 tablet, Rfl: 2   metoprolol  tartrate (LOPRESSOR ) 50 MG tablet, Take 1 tablet (50 mg total) by mouth 2 (two) times daily., Disp: 180 tablet, Rfl: 3   torsemide  (DEMADEX ) 20 MG tablet, Take 2 tablets (40 mg total) by mouth daily., Disp: 90 tablet, Rfl: 3  Review of Systems:  ***  Constitutional: Eye: Respiratory: Cardiovascular: GI: MSK: GU: Skin: Neuro: Endocrine:   Physical Exam:  There were no vitals filed for this visit. *** General: Patient is sitting comfortably in the room  Eyes: Pupils equal and reactive to light, EOM intact  Head: Normocephalic, atraumatic  Neck: Supple, nontender, full range of motion, No JVD Cardio: Regular rate and rhythm, no murmurs, rubs or gallops. 2+ pulses to bilateral upper and lower extremities  Chest: No chest tenderness Pulmonary: Clear to ausculation bilaterally with no rales, rhonchi, and crackles  Abdomen: Soft, nontender with normoactive bowel sounds with no rebound or guarding  Neuro: Alert and orientated x3. CN II-XII intact. Sensation intact to upper and lower extremities. 2+ patellar reflex.  Back: No  midline tenderness, no step off or deformities noted. No paraspinal muscle tenderness.  Skin: No rashes noted  MSK: 5/5 strength to upper and lower extremities.    Assessment & Plan:   Assessment & Plan      Patient {[id number]::discussed with,seen with} Dr. {[id number]::Hlpoonli,Ynqqfjw,Floozw,Wjmzwimj,Tpoopjfd,Cpwrzwu}  Abigail Blanch,  DO Internal Medicine Resident PGY-3

## 2023-12-01 ENCOUNTER — Other Ambulatory Visit (HOSPITAL_BASED_OUTPATIENT_CLINIC_OR_DEPARTMENT_OTHER): Payer: Self-pay

## 2023-12-01 ENCOUNTER — Other Ambulatory Visit: Payer: Self-pay | Admitting: Cardiology

## 2023-12-01 ENCOUNTER — Other Ambulatory Visit: Payer: Self-pay

## 2023-12-01 DIAGNOSIS — Z8673 Personal history of transient ischemic attack (TIA), and cerebral infarction without residual deficits: Secondary | ICD-10-CM

## 2023-12-02 ENCOUNTER — Other Ambulatory Visit (HOSPITAL_BASED_OUTPATIENT_CLINIC_OR_DEPARTMENT_OTHER): Payer: Self-pay

## 2023-12-02 MED ORDER — ATORVASTATIN CALCIUM 40 MG PO TABS
40.0000 mg | ORAL_TABLET | Freq: Every day | ORAL | 3 refills | Status: AC
  Filled 2023-12-02: qty 90, 90d supply, fill #0
  Filled 2024-02-02: qty 30, 30d supply, fill #1
  Filled 2024-03-30: qty 30, 30d supply, fill #2

## 2023-12-06 ENCOUNTER — Ambulatory Visit: Admitting: Physician Assistant

## 2023-12-06 NOTE — Progress Notes (Deleted)
 [name], PA-C [address] [place], [place]  [address] Phone: [phone]   Primary Care Physician: Harrie Bruckner, DO  Primary Gastroenterologist:  [name], PA-C / Dr. Gordy Starch   Chief Complaint:  ***       HPI:   Abigail Butler is a 58 y.o. female  Last seen in our office 03/2023 for GERD and history of recurrent H. Pylori.  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  She underwent H. pylori stool antigen testing per her PCP 08/2022 which was positive, prescribed Cipro /metronidazole /tetracycline /bismuth  and PPI which was discontinued secondary to chest pain thought to be as an adverse response to this regimen.  She was seen by her PCP for follow-up 01/03/2023 who prescribed Pylera  as an alternative H. pylori treatment which the patient has not taken.   PMH: paroxysmal atrial fibrillation s/p Maze 06/2021 on ASA and severe mitral stenosis s/p MVR 06/2021. Coronary angiography in 2019 revealed normal coronary arteries. ECHO 11/2022 with LVEF 55 - 60%.      Current Outpatient Medications  Medication Sig Dispense Refill   acetaminophen  (TYLENOL ) 500 MG tablet Take 500-1,000 mg by mouth as needed for mild pain (pain score 1-3) or moderate pain (pain score 4-6).     amLODipine  (NORVASC ) 5 MG tablet Take 1 tablet (5 mg total) by mouth daily. 90 tablet 3   aspirin  81 MG EC tablet Take 1 tablet (81 mg total) by mouth daily. Swallow whole. 150 tablet 2   atorvastatin  (LIPITOR) 40 MG tablet Take 1 tablet (40 mg total) by mouth daily. 90 tablet 3   metoprolol  tartrate (LOPRESSOR ) 50 MG tablet Take 1 tablet (50 mg total) by mouth 2 (two) times daily. 180 tablet 3   torsemide  (DEMADEX ) 20 MG tablet Take 2 tablets (40 mg total) by mouth daily. 90 tablet 3   No current facility-administered medications for this visit.    Allergies as of 12/06/2023 - Review Complete  11/03/2023  Allergen Reaction Noted   Pork-derived products Other (See Comments) 04/19/2017    Past Medical History:  Diagnosis Date   (HFpEF) heart failure with preserved ejection fraction (HCC) 08/09/2023   Cervical cancer screening 06/04/2021   Chest pain 04/07/2017   Chronic left shoulder pain 03/23/2022   Daily headache    sometimes; often (02/10/2012)   Diabetes mellitus (HCC) 01/03/2023   Dysuria 10/26/2022   GERD (gastroesophageal reflux disease)    H. pylori infection    ?failed standard tx; currently undergoing levofloxacin  salvage tx   H/O: CVA (cerebrovascular accident) 06/01/2013   Right thalamic CVA 2015   Health care maintenance 12/24/2013   Health care maintenance 12/24/2013   Hypertension 05/11/2017   Long term (current) use of anticoagulants 06/22/2021   Lower extremity edema 09/16/2022   Lumbar disc disease with radiculopathy 10/15/2014   Mass of left breast on mammogram 05/06/2015   Migraine without status migrainosus, not intractable 09/16/2022   Mixed dyslipidemia 11/12/2021   PAF (paroxysmal atrial fibrillation) (HCC) 11/12/2021   Prediabetes 10/15/2014   A1c 5.7 on 10/15/14   Right arm pain 10/15/2016   S/P Maze operation for atrial fibrillation 07/17/2021   S/P MVR (mitral valve replacement) 07/17/2021   TB lung, latent 2009   T[facility] in 2009 by [facility]   Thrombus of left atrial appendage 06/04/2021   Urinary tract infection without hematuria 07/13/2013   Vaginal itching 01/03/2023  Past Surgical History:  Procedure Laterality Date   DILATION AND CURETTAGE OF UTERUS  ~ 2008   cleaned out infection (02/10/2012)   LEFT HEART CATH AND CORONARY ANGIOGRAPHY N/A 04/19/2017   Procedure: LEFT HEART CATH AND CORONARY ANGIOGRAPHY;  Surgeon: Swazil[place], Peter M, MD;  Location: [facility] INVASIVE CV LAB;  Service: Cardiovascular;  Laterality: N/A;   MAZE N/A 07/17/2021   Procedure: MAZE;  Surgeon: Lucas Dorise POUR, MD;  Location: [facility] OR;   Service: Open Heart Surgery;  Laterality: N/A;   MITRAL VALVE REPLACEMENT N/A 07/17/2021   Procedure: MITRAL VALVE REPLACEMENT USING 27 MM MITRIS VALVE.  CRYO AND RADIOFREQUENCY MAZE PROCEDURE. LIGATION OF LEFT ATRIAL APPENDAGE.;  Surgeon: Lucas Dorise POUR, MD;  Location: [facility] OR;  Service: Open Heart Surgery;  Laterality: N/A;   TEE WITHOUT CARDIOVERSION N/A 05/27/2021   Procedure: TRANSESOPHAGEAL ECHOCARDIOGRAM (TEE);  Surgeon: Kate Lonni CROME, MD;  Location: [facility] ENDOSCOPY;  Service: Cardiovascular;  Laterality: N/A;   TEE WITHOUT CARDIOVERSION N/A 07/17/2021   Procedure: TRANSESOPHAGEAL ECHOCARDIOGRAM (TEE);  Surgeon: Lucas Dorise POUR, MD;  Location: [facility] OR;  Service: Open Heart Surgery;  Laterality: N/A;    Review of Systems:    All systems reviewed and negative except where noted in HPI.    Physical Exam:  There were no vitals taken for this visit. No LMP recorded. Patient has had a hysterectomy.  General: Well-nourished, well-developed in no acute distress.  Lungs: Clear to auscultation bilaterally. Non-labored. Heart: Regular rate and rhythm, no murmurs rubs or gallops.  Abdomen: Bowel sounds are normal; Abdomen is Soft; No hepatosplenomegaly, masses or hernias;  No Abdominal Tenderness; No guarding or rebound tenderness. Neuro: Alert and oriented x 3.  Grossly intact.  Psych: Alert and cooperative, normal mood and affect.   Imaging Studies: No results found.  Labs: CBC    Component Value Date/Time   WBC 7.0 08/16/2023 1154   WBC 7.8 03/14/2023 1306   RBC 4.16 08/16/2023 1154   RBC 4.31 03/14/2023 1306   HGB 11.8 08/16/2023 1154   HCT 36.0 08/16/2023 1154   PLT 205 08/16/2023 1154   MCV 87 08/16/2023 1154   MCH 28.4 08/16/2023 1154   MCH 26.8 11/19/2022 2105   MCHC 32.8 08/16/2023 1154   MCHC 33.0 03/14/2023 1306   RDW 13.0 08/16/2023 1154   LYMPHSABS 2.5 08/16/2023 1154   MONOABS 0.7 03/14/2023 1306   EOSABS 0.2 08/16/2023 1154   BASOSABS 0.1 08/16/2023 1154     CMP     Component Value Date/Time   NA 141 08/16/2023 1154   K 4.7 08/16/2023 1154   CL 105 08/16/2023 1154   CO2 21 08/16/2023 1154   GLUCOSE 100 (H) 08/16/2023 1154   GLUCOSE 111 (H) 03/14/2023 1306   BUN 12 08/16/2023 1154   CREATININE 0.67 08/16/2023 1154   CREATININE 0.60 11/02/2013 1638   CALCIUM  9.4 08/16/2023 1154   PROT 7.7 04/29/2023 1014   ALBUMIN  4.2 04/29/2023 1014   AST 22 04/29/2023 1014   ALT 16 04/29/2023 1014   ALKPHOS 127 (H) 04/29/2023 1014   BILITOT 0.4 04/29/2023 1014   GFRNONAA >60 11/19/2022 2105   GFRNONAA >89 11/02/2013 1638   GFRAA 115 04/15/2017 0000   GFRAA >89 11/02/2013 1638       Assessment and Plan:   Shandria Delahoussaye is a 58 y.o. y/o female ***    [name], PA-C  Follow up ***

## 2023-12-13 ENCOUNTER — Ambulatory Visit: Admitting: Student

## 2023-12-13 NOTE — Progress Notes (Deleted)
 CC: ***  HPI:  [name] is a 58 y.o. female with past medical history of HFpEF, hypertension, migraines, A-fib who presents for follow-up appointment.  Please see assessment and plan for full HPI.  Medications: HFpEF: Metoprolol  tartrate 50 mg twice daily, aspirin  81 mg daily, torsemide  40 mg daily Hyperlipidemia: Lipitor 40 mg daily Hypertension: Amlodipine  5 mg daily  Patient is status post maze operation  Last seen in our clinic on 10/03/23.  Flu Tdap Pneumococcal PAP Colonoscopy    Past Medical History:  Diagnosis Date   (HFpEF) heart failure with preserved ejection fraction (HCC) 08/09/2023   Cervical cancer screening 06/04/2021   Chest pain 04/07/2017   Chronic left shoulder pain 03/23/2022   Daily headache    sometimes; often (02/10/2012)   Diabetes mellitus (HCC) 01/03/2023   Dysuria 10/26/2022   GERD (gastroesophageal reflux disease)    H. pylori infection    ?failed standard tx; currently undergoing levofloxacin  salvage tx   H/O: CVA (cerebrovascular accident) 06/01/2013   Right thalamic CVA 2015   Health care maintenance 12/24/2013   Health care maintenance 12/24/2013   Hypertension 05/11/2017   Long term (current) use of anticoagulants 06/22/2021   Lower extremity edema 09/16/2022   Lumbar disc disease with radiculopathy 10/15/2014   Mass of left breast on mammogram 05/06/2015   Migraine without status migrainosus, not intractable 09/16/2022   Mixed dyslipidemia 11/12/2021   PAF (paroxysmal atrial fibrillation) (HCC) 11/12/2021   Prediabetes 10/15/2014   A1c 5.7 on 10/15/14   Right arm pain 10/15/2016   S/P Maze operation for atrial fibrillation 07/17/2021   S/P MVR (mitral valve replacement) 07/17/2021   TB lung, latent 2009   Treated in 2009 by [facility]   Thrombus of left atrial appendage 06/04/2021   Urinary tract infection without hematuria 07/13/2013   Vaginal itching 01/03/2023     Current Outpatient  Medications:    acetaminophen  (TYLENOL ) 500 MG tablet, Take 500-1,000 mg by mouth as needed for mild pain (pain score 1-3) or moderate pain (pain score 4-6)., Disp: , Rfl:    amLODipine  (NORVASC ) 5 MG tablet, Take 1 tablet (5 mg total) by mouth daily., Disp: 90 tablet, Rfl: 3   aspirin  81 MG EC tablet, Take 1 tablet (81 mg total) by mouth daily. Swallow whole., Disp: 150 tablet, Rfl: 2   atorvastatin  (LIPITOR) 40 MG tablet, Take 1 tablet (40 mg total) by mouth daily., Disp: 90 tablet, Rfl: 3   metoprolol  tartrate (LOPRESSOR ) 50 MG tablet, Take 1 tablet (50 mg total) by mouth 2 (two) times daily., Disp: 180 tablet, Rfl: 3   torsemide  (DEMADEX ) 20 MG tablet, Take 2 tablets (40 mg total) by mouth daily., Disp: 90 tablet, Rfl: 3  Review of Systems:  ***  Constitutional: Eye: Respiratory: Cardiovascular: GI: MSK: GU: Skin: Neuro: Endocrine:   Physical Exam:  There were no vitals filed for this visit. *** General: Patient is sitting comfortably in the room  Eyes: Pupils equal and reactive to light, EOM intact  Head: Normocephalic, atraumatic  Neck: Supple, nontender, full range of motion, No JVD Cardio: Regular rate and rhythm, no murmurs, rubs or gallops. 2+ pulses to bilateral upper and lower extremities  Chest: No chest tenderness Pulmonary: Clear to ausculation bilaterally with no rales, rhonchi, and crackles  Abdomen: Soft, nontender with normoactive bowel sounds with no rebound or guarding  Neuro: Alert and orientated x3. CN II-XII intact. Sensation intact to upper and lower extremities. 2+ patellar reflex.  Back: No  midline tenderness, no step off or deformities noted. No paraspinal muscle tenderness.  Skin: No rashes noted  MSK: 5/5 strength to upper and lower extremities.    Assessment & Plan:   Assessment & Plan      Patient {GC/GE:[id number]::discussed with,seen with} Dr. {WJFZD:[id number]::Hlpoonli,Ynqqfjw,Floozw,Wjmzwimj,Tpoopjfd,Cpwrzwu}  Libby Blanch,  DO Internal Medicine Resident PGY-3

## 2023-12-21 ENCOUNTER — Ambulatory Visit: Admitting: Student

## 2023-12-21 NOTE — Progress Notes (Deleted)
 CC: Acute visit  HPI: [name] is a 58 y.o. female living with a history stated below and presents today for acute visit. Please see problem based assessment and plan for additional details.  ***Interpreter present during encounter.  Past Medical History:  Diagnosis Date   (HFpEF) heart failure with preserved ejection fraction (HCC) 08/09/2023   Cervical cancer screening 06/04/2021   Chest pain 04/07/2017   Chronic left shoulder pain 03/23/2022   Daily headache    sometimes; often (02/10/2012)   Diabetes mellitus (HCC) 01/03/2023   Dysuria 10/26/2022   GERD (gastroesophageal reflux disease)    H. pylori infection    ?failed standard tx; currently undergoing levofloxacin  salvage tx   H/O: CVA (cerebrovascular accident) 06/01/2013   Right thalamic CVA 2015   Health care maintenance 12/24/2013   Health care maintenance 12/24/2013   Hypertension 05/11/2017   Long term (current) use of anticoagulants 06/22/2021   Lower extremity edema 09/16/2022   Lumbar disc disease with radiculopathy 10/15/2014   Mass of left breast on mammogram 05/06/2015   Migraine without status migrainosus, not intractable 09/16/2022   Mixed dyslipidemia 11/12/2021   PAF (paroxysmal atrial fibrillation) (HCC) 11/12/2021   Prediabetes 10/15/2014   A1c 5.7 on 10/15/14   Right arm pain 10/15/2016   S/P Maze operation for atrial fibrillation 07/17/2021   S/P MVR (mitral valve replacement) 07/17/2021   TB lung, latent 2009   Treated in 2009 by [facility]   Thrombus of left atrial appendage 06/04/2021   Urinary tract infection without hematuria 07/13/2013   Vaginal itching 01/03/2023    Current Outpatient Medications on File Prior to Visit  Medication Sig Dispense Refill   acetaminophen  (TYLENOL ) 500 MG tablet Take 500-1,000 mg by mouth as needed for mild pain (pain score 1-3) or moderate pain (pain score 4-6).     amLODipine  (NORVASC ) 5 MG tablet Take 1 tablet (5 mg total) by  mouth daily. 90 tablet 3   aspirin  81 MG EC tablet Take 1 tablet (81 mg total) by mouth daily. Swallow whole. 150 tablet 2   atorvastatin  (LIPITOR) 40 MG tablet Take 1 tablet (40 mg total) by mouth daily. 90 tablet 3   metoprolol  tartrate (LOPRESSOR ) 50 MG tablet Take 1 tablet (50 mg total) by mouth 2 (two) times daily. 180 tablet 3   torsemide  (DEMADEX ) 20 MG tablet Take 2 tablets (40 mg total) by mouth daily. 90 tablet 3   No current facility-administered medications on file prior to visit.    Family History  Problem Relation Age of Onset   Colon cancer Neg Hx    Colon polyps Neg Hx    Esophageal cancer Neg Hx    Rectal cancer Neg Hx    Stomach cancer Neg Hx     Social History   Socioeconomic History   Marital status: Married    Spouse name: Not on file   Number of children: 3   Years of education: Not on file   Highest education level: Not on file  Occupational History   Occupation: Homemaker  Tobacco Use   Smoking status: Never    Passive exposure: Never   Smokeless tobacco: Never  Vaping Use   Vaping status: Never Used  Substance and Sexual Activity   Alcohol use: No    Alcohol/week: 0.0 standard drinks of alcohol   Drug use: No   Sexual activity: Not on file  Other Topics Concern   Not on file  Social History Narrative   **  Merged History Encounter **       ** Merged History Encounter **       Social Drivers of Corporate investment banker Strain: Not on file  Food Insecurity: No Food Insecurity (08/09/2023)   Hunger Vital Sign    Worried About Running Out of Food in the Last Year: Never true    Ran Out of Food in the Last Year: Never true  Transportation Needs: No Transportation Needs (08/09/2023)   PRAPARE - Administrator, Civil Service (Medical): No    Lack of Transportation (Non-Medical): No  Physical Activity: Inactive (09/07/2023)   Exercise Vital Sign    Days of Exercise per Week: 0 days    Minutes of Exercise per Session: 0 min   Stress: No Stress Concern Present (08/09/2023)   [employer] of Occupational Health - Occupational Stress Questionnaire    Feeling of Stress : Not at all  Social Connections: Moderately Isolated (08/09/2023)   Social Connection and Isolation Panel    Frequency of Communication with Friends and Family: More than three times a week    Frequency of Social Gatherings with Friends and Family: More than three times a week    Attends Religious Services: Never    Database administrator or Organizations: No    Attends Banker Meetings: Never    Marital Status: Married  Catering manager Violence: Not At Risk (08/09/2023)   Humiliation, Afraid, Rape, and Kick questionnaire    Fear of Current or Ex-Partner: No    Emotionally Abused: No    Physically Abused: No    Sexually Abused: No    Review of Systems: ROS negative except for what is noted on the assessment and plan.  There were no vitals filed for this visit.  Physical Exam  Physical Exam: Constitutional: well-appearing *** sitting in ***, in no acute distress HENT: normocephalic atraumatic, mucous membranes moist Eyes: conjunctiva non-erythematous Cardiovascular: regular rate and rhythm, no m/r/g Pulmonary/Chest: normal work of breathing on room air, lungs clear to auscultation bilaterally Abdominal: soft, non-tender, non-distended MSK: *** Neurological: alert & oriented x 3, 5/5 strength in bilateral upper and lower extremities, normal gait Skin: warm and dry Psych: ***  Assessment & Plan:   Assessment & Plan     No orders of the defined types were placed in this encounter.  PMH: Paroxysmal A-fib s/p Maze, s/p MVR, migraine, HTN, HFpEF, GERD, T2DM, latent TB  Acute visit: Headache *** OV 08/2022, chronic, endorses photophobia and phonophobia with nausea.  HTN Status: {statusupdate:33856}. BP today ***. Reports taking amlodipine  5 mg, metoprolol  50 mg twice daily, torsemide  40 mg (discussed at LOV 8/4 of  increased to 40 but was previously taking 20).  Last BMP in 07/2023 with normal renal function and electrolytes. *** symptoms. *** home BP monitoring.  ***Bradycardia??  Plan -Continue: *** -BMP ***  HFpEF LOV 8/4, discussed increased 40 mg of torsemide .   History of A-fib post maze procedure and left atrial clipping--patient not on anticoagulation, saw cardiology 9//25  CARE: Flu, Tdap, PCV, pap, colo, HCV  No follow-ups on file.   Patient {GC/GE:[id number]::discussed with,seen with} Dr. {[id number]:[id number]::Tpoopjfd,Z. Hoffman,Winfrey,Narendra,Chun,Chambliss,Lau,Machen}  Abigail Butler, D.O. [facility] Internal Medicine, PGY-3 Clinic Phone: [phone] Date 12/21/2023 Time 7:30 AM

## 2023-12-22 ENCOUNTER — Ambulatory Visit: Admitting: Student

## 2023-12-22 NOTE — Progress Notes (Deleted)
 CC: Acute visit  HPI: [name] is a 58 y.o. female living with a history stated below and presents today for acute visit. Please see problem based assessment and plan for additional details.  ***Interpreter present during encounter.  Past Medical History:  Diagnosis Date   (HFpEF) heart failure with preserved ejection fraction (HCC) 08/09/2023   Cervical cancer screening 06/04/2021   Chest pain 04/07/2017   Chronic left shoulder pain 03/23/2022   Daily headache    sometimes; often (02/10/2012)   Diabetes mellitus (HCC) 01/03/2023   Dysuria 10/26/2022   GERD (gastroesophageal reflux disease)    H. pylori infection    ?failed standard tx; currently undergoing levofloxacin  salvage tx   H/O: CVA (cerebrovascular accident) 06/01/2013   Right thalamic CVA 2015   Health care maintenance 12/24/2013   Health care maintenance 12/24/2013   Hypertension 05/11/2017   Long term (current) use of anticoagulants 06/22/2021   Lower extremity edema 09/16/2022   Lumbar disc disease with radiculopathy 10/15/2014   Mass of left breast on mammogram 05/06/2015   Migraine without status migrainosus, not intractable 09/16/2022   Mixed dyslipidemia 11/12/2021   PAF (paroxysmal atrial fibrillation) (HCC) 11/12/2021   Prediabetes 10/15/2014   A1c 5.7 on 10/15/14   Right arm pain 10/15/2016   S/P Maze operation for atrial fibrillation 07/17/2021   S/P MVR (mitral valve replacement) 07/17/2021   TB lung, latent 2009   Treated in 2009 by [facility]   Thrombus of left atrial appendage 06/04/2021   Urinary tract infection without hematuria 07/13/2013   Vaginal itching 01/03/2023    Current Outpatient Medications on File Prior to Visit  Medication Sig Dispense Refill   acetaminophen  (TYLENOL ) 500 MG tablet Take 500-1,000 mg by mouth as needed for mild pain (pain score 1-3) or moderate pain (pain score 4-6).     amLODipine  (NORVASC ) 5 MG tablet Take 1 tablet (5 mg total) by  mouth daily. 90 tablet 3   aspirin  81 MG EC tablet Take 1 tablet (81 mg total) by mouth daily. Swallow whole. 150 tablet 2   atorvastatin  (LIPITOR) 40 MG tablet Take 1 tablet (40 mg total) by mouth daily. 90 tablet 3   metoprolol  tartrate (LOPRESSOR ) 50 MG tablet Take 1 tablet (50 mg total) by mouth 2 (two) times daily. 180 tablet 3   torsemide  (DEMADEX ) 20 MG tablet Take 2 tablets (40 mg total) by mouth daily. 90 tablet 3   No current facility-administered medications on file prior to visit.    Family History  Problem Relation Age of Onset   Colon cancer Neg Hx    Colon polyps Neg Hx    Esophageal cancer Neg Hx    Rectal cancer Neg Hx    Stomach cancer Neg Hx     Social History   Socioeconomic History   Marital status: Married    Spouse name: Not on file   Number of children: 3   Years of education: Not on file   Highest education level: Not on file  Occupational History   Occupation: Homemaker  Tobacco Use   Smoking status: Never    Passive exposure: Never   Smokeless tobacco: Never  Vaping Use   Vaping status: Never Used  Substance and Sexual Activity   Alcohol use: No    Alcohol/week: 0.0 standard drinks of alcohol   Drug use: No   Sexual activity: Not on file  Other Topics Concern   Not on file  Social History Narrative   **  Merged History Encounter **       ** Merged History Encounter **       Social Drivers of Corporate investment banker Strain: Not on file  Food Insecurity: No Food Insecurity (08/09/2023)   Hunger Vital Sign    Worried About Running Out of Food in the Last Year: Never true    Ran Out of Food in the Last Year: Never true  Transportation Needs: No Transportation Needs (08/09/2023)   PRAPARE - Administrator, Civil Service (Medical): No    Lack of Transportation (Non-Medical): No  Physical Activity: Inactive (09/07/2023)   Exercise Vital Sign    Days of Exercise per Week: 0 days    Minutes of Exercise per Session: 0 min   Stress: No Stress Concern Present (08/09/2023)   [employer] of Occupational Health - Occupational Stress Questionnaire    Feeling of Stress : Not at all  Social Connections: Moderately Isolated (08/09/2023)   Social Connection and Isolation Panel    Frequency of Communication with Friends and Family: More than three times a week    Frequency of Social Gatherings with Friends and Family: More than three times a week    Attends Religious Services: Never    Database administrator or Organizations: No    Attends Banker Meetings: Never    Marital Status: Married  Catering manager Violence: Not At Risk (08/09/2023)   Humiliation, Afraid, Rape, and Kick questionnaire    Fear of Current or Ex-Partner: No    Emotionally Abused: No    Physically Abused: No    Sexually Abused: No    Review of Systems: ROS negative except for what is noted on the assessment and plan.  There were no vitals filed for this visit.  Physical Exam  Physical Exam: Constitutional: well-appearing *** sitting in ***, in no acute distress HENT: normocephalic atraumatic, mucous membranes moist Eyes: conjunctiva non-erythematous Cardiovascular: regular rate and rhythm, no m/r/g Pulmonary/Chest: normal work of breathing on room air, lungs clear to auscultation bilaterally Abdominal: soft, non-tender, non-distended MSK: *** Neurological: alert & oriented x 3, 5/5 strength in bilateral upper and lower extremities, normal gait Skin: warm and dry Psych: ***  Assessment & Plan:   Assessment & Plan     No orders of the defined types were placed in this encounter.  PMH: Paroxysmal A-fib s/p Maze, s/p MVR, migraine, HTN, HFpEF, GERD, T2DM, latent TB Medications: HFpEF: Metoprolol  tartrate 50 mg twice daily, aspirin  81 mg daily, torsemide  40 mg daily Hyperlipidemia: Lipitor 40 mg daily Hypertension: Amlodipine  5 mg daily  Patient is status post maze operation  Last seen in our clinic on  10/03/23.   Acute visit: Headache *** OV 08/2022, chronic, endorses photophobia and phonophobia with nausea.  HTN Status: {statusupdate:33856}. BP today ***. Reports taking amlodipine  5 mg, metoprolol  50 mg twice daily, torsemide  40 mg (discussed at LOV 8/4 of increased to 40 but was previously taking 20).  Last BMP in 07/2023 with normal renal function and electrolytes. *** symptoms. *** home BP monitoring.  ***Bradycardia??  Plan -Continue: *** -BMP ***  HFpEF LOV 8/4, discussed increased 40 mg of torsemide .   History of A-fib post maze procedure and left atrial clipping--patient not on anticoagulation, saw cardiology 9//25  CARE: Flu, Tdap, PCV, pap, colo, HCV  No follow-ups on file.   Patient {GC/GE:[id number]::discussed with,seen with} Dr. {WJFZD:[id number]::Tpoopjfd,Z. Hoffman,Winfrey,Narendra,Chun,Chambliss,Lau,Machen}  Ozell Nearing, D.O. [facility] Internal Medicine, PGY-3 Clinic Phone: [phone] Date 12/22/2023 Time  8:02 AM

## 2024-01-20 ENCOUNTER — Telehealth (HOSPITAL_COMMUNITY): Payer: Self-pay | Admitting: *Deleted

## 2024-01-20 ENCOUNTER — Other Ambulatory Visit (HOSPITAL_BASED_OUTPATIENT_CLINIC_OR_DEPARTMENT_OTHER): Payer: Self-pay

## 2024-01-20 ENCOUNTER — Other Ambulatory Visit

## 2024-01-20 ENCOUNTER — Ambulatory Visit: Admitting: Gastroenterology

## 2024-01-20 ENCOUNTER — Encounter: Payer: Self-pay | Admitting: Gastroenterology

## 2024-01-20 VITALS — BP 160/98 | HR 74 | Ht 64.0 in | Wt 200.0 lb

## 2024-01-20 DIAGNOSIS — A048 Other specified bacterial intestinal infections: Secondary | ICD-10-CM | POA: Diagnosis not present

## 2024-01-20 DIAGNOSIS — I5032 Chronic diastolic (congestive) heart failure: Secondary | ICD-10-CM | POA: Diagnosis not present

## 2024-01-20 DIAGNOSIS — R1310 Dysphagia, unspecified: Secondary | ICD-10-CM | POA: Diagnosis not present

## 2024-01-20 DIAGNOSIS — Z1211 Encounter for screening for malignant neoplasm of colon: Secondary | ICD-10-CM

## 2024-01-20 DIAGNOSIS — I480 Paroxysmal atrial fibrillation: Secondary | ICD-10-CM

## 2024-01-20 DIAGNOSIS — I10 Essential (primary) hypertension: Secondary | ICD-10-CM

## 2024-01-20 MED ORDER — OMEPRAZOLE 40 MG PO CPDR
40.0000 mg | DELAYED_RELEASE_CAPSULE | Freq: Every day | ORAL | 3 refills | Status: AC
  Filled 2024-01-20: qty 30, 30d supply, fill #0

## 2024-01-20 NOTE — Patient Instructions (Addendum)
 _______________________________________________________  If your blood pressure at your visit was 140/90 or greater, please contact your primary care physician to follow up on this.  _______________________________________________________  If you are age 58 or older, your body mass index should be between 23-30. Your Body mass index is 34.33 kg/m. If this is out of the aforementioned range listed, please consider follow up with your Primary Care Provider.  If you are age 32 or younger, your body mass index should be between 19-25. Your Body mass index is 34.33 kg/m. If this is out of the aformentioned range listed, please consider follow up with your Primary Care Provider.   ________________________________________________________  The  GI providers would like to encourage you to use MYCHART to communicate with providers for non-urgent requests or questions.  Due to long hold times on the telephone, sending your provider a message by [facility] may be a faster and more efficient way to get a response.  Please allow 48 business hours for a response.  Please remember that this is for non-urgent requests.  _______________________________________________________  [facility] is using a team-based approach to care.  Your team is made up of your doctor and two to three APPS. Our APPS (Nurse Practitioners and Physician Assistants) work with your physician to ensure care continuity for you. They are fully qualified to address your health concerns and develop a treatment plan. They communicate directly with your gastroenterologist to care for you. Seeing the Advanced Practice Practitioners on your physician's team can help you by facilitating care more promptly, often allowing for earlier appointments, access to diagnostic testing, procedures, and other specialty referrals.   Your provider has requested that you go to the basement level for lab work before leaving today. Press B on the  elevator. The lab is located at the first door on the left as you exit the elevator.  We have sent the following medications to your pharmacy for you to pick up at your convenience:  START: omeprazole  40mg  one capsule daily  Your provider has ordered Cologuard testing as an option for colon cancer screening. This is performed by [employer] and may be out of network with your insurance. PRIOR to completing the test, it is YOUR responsibility to contact your insurance about covered benefits for this test. Your out of pocket expense could be anywhere from $0.00 to $649.00.   When you call to check coverage with your insurer, please provide the following information:   -The ONLY provider of Cologuard is Optician, Dispensing  - CPT code for Cologuard is [phone].  [employer] NPI # [id number]  -[employer] Tax ID # [id number]   We have already sent your demographic and insurance information to [employer] (phone number [phone]) and they should contact you within the next week regarding your test. If you have not heard from them within the next week, please call our office at [phone].  You have been scheduled for a modified barium swallow on _______ at ______. Please arrive 30 minutes prior to your test for registration. You will go to _______ Radiology (1st Floor) for your appointment. Should you need to cancel or reschedule your appointment, please contact [phone] Clora Darrouzett) or [phone] Geroge Long). _____________________________________________________________________ A Modified Barium Swallow Study, or MBS, is a special x-ray that is taken to check swallowing skills. It is carried out by a Marine Scientist and a Warehouse Manager (SLP). During this test, yourmouth, throat, and esophagus, a muscular tube which connects your mouth to your  stomach, is checked. The test will help you, your doctor, and the SLP plan what types of  foods and liquids are easier for you to swallow. The SLP will also identify positions and ways to help you swallow more easily and safely. What will happen during an MBS? You will be taken to an x-ray room and seated comfortably. You will be asked to swallow small amounts of food and liquid mixed with barium. Barium is a liquid or paste that allows images of your mouth, throat and esophagus to be seen on x-ray. The x-ray captures moving images of the food you are swallowing as it travels from your mouth through your throat and into your esophagus. This test helps identify whether food or liquid is entering your lungs (aspiration). The test also shows which part of your mouth or throat lacks strength or coordination to move the food or liquid in the right direction. This test typically takes 30 minutes to 1 hour to complete. _______________________________________________________________________  Due to recent changes in healthcare laws, you may see the results of your imaging and laboratory studies on MyChart before your provider has had a chance to review them.  We understand that in some cases there may be results that are confusing or concerning to you. Not all laboratory results come back in the same time frame and the provider may be waiting for multiple results in order to interpret others.  Please give us  48 hours in order for your provider to thoroughly review all the results before contacting the office for clarification of your results.

## 2024-01-20 NOTE — Telephone Encounter (Signed)
 Attempted to contact patient via Interpreter Services to schedule OP MBS. VM unavailable @ [phone]. RKEEL

## 2024-01-20 NOTE — Progress Notes (Signed)
 Chief Complaint: Follow up Primary GI MD: Dr. Albertus  HPI: Abigail Butler is a 58 year old female with a past medical history of hypertension, paroxysmal atrial fibrillation s/p Maze 06/2021,severe mitral valve stensosis s/p MVR 06/2021, CVA 2014, DM type II, latent TB (treated), GERD and H. Pylori. Previously known by Dr. Teressa.  She is originally from [place] and speaks Arabic therefore she is accompanied by a [employer] Arabic interpreter to facilitate communication throughout today's office visit.  Her daughter is also present who speaks limited English.  Obtaining the patient's history was challenging.   She underwent an EGD 09/03/2015 which identified H. pylori gastritis.  She was prescribed Metronidazole  500 mg 1 tab 4 times daily, Tetracycline  500 mg 1 tab 4 times daily, bismuth  subsalicylate 2 tabs 4 times daily and PPI twice daily.  However, patient did not take this treatment as prescribed.    She underwent repeat H. pylori stool antigen 04/08/2017 which was positive, she did not take the treatment that was prescribed at that time.  Seen by Elida Nyle Sharps, NP January 2025.  She is recommended to get EGD/colonoscopy but needed cardiac clearance prior to proceeding   Discussed the use of AI scribe software for clinical note transcription with the patient, who gave verbal consent to proceed.  She experiences difficulty swallowing, describing it as a sensation of 'everything is just closing together.' Swallowing is challenging even with saliva and worsens with fluids, particularly at night. She needs to sit up to swallow and feels discomfort when lying down, affecting her sleep. This issue has progressively worsened since her heart valve procedure in 2023.  She has a history of hypertension, with home readings fluctuating between 147 and 160 mmHg. She is on amlodipine  5 mg, which was increased by her cardiologist in September. She also takes furosemide  for leg swelling, which she ran out  of and is unsure if she should continue. Her blood pressure remains elevated despite medication.  She underwent a heart valve procedure in 2023 and has a history of atrial fibrillation. She experiences leg swelling and burning sensations, particularly around the ankles, which may be related to her medications.  She has a history of H. Pylori. However, she finds it difficult to lie flat due to discomfort, described as a sensation of her heart and chest 'going to burst.'   Past Medical History:  Diagnosis Date   (HFpEF) heart failure with preserved ejection fraction (HCC) 08/09/2023   Cervical cancer screening 06/04/2021   Chest pain 04/07/2017   Chronic left shoulder pain 03/23/2022   Daily headache    sometimes; often (02/10/2012)   Diabetes mellitus (HCC) 01/03/2023   Dysuria 10/26/2022   GERD (gastroesophageal reflux disease)    H. pylori infection    ?failed standard tx; currently undergoing levofloxacin  salvage tx   H/O: CVA (cerebrovascular accident) 06/01/2013   Right thalamic CVA 2015   Health care maintenance 12/24/2013   Health care maintenance 12/24/2013   Hypertension 05/11/2017   Long term (current) use of anticoagulants 06/22/2021   Lower extremity edema 09/16/2022   Lumbar disc disease with radiculopathy 10/15/2014   Mass of left breast on mammogram 05/06/2015   Migraine without status migrainosus, not intractable 09/16/2022   Mixed dyslipidemia 11/12/2021   PAF (paroxysmal atrial fibrillation) (HCC) 11/12/2021   Prediabetes 10/15/2014   A1c 5.7 on 10/15/14   Right arm pain 10/15/2016   S/P Maze operation for atrial fibrillation 07/17/2021   S/P MVR (mitral valve replacement) 07/17/2021  TB lung, latent 2009   Treated in 2009 by [facility]   Thrombus of left atrial appendage 06/04/2021   Urinary tract infection without hematuria 07/13/2013   Vaginal itching 01/03/2023    Past Surgical History:  Procedure Laterality Date   DILATION AND  CURETTAGE OF UTERUS  ~ 2008   cleaned out infection (02/10/2012)   LEFT HEART CATH AND CORONARY ANGIOGRAPHY N/A 04/19/2017   Procedure: LEFT HEART CATH AND CORONARY ANGIOGRAPHY;  Surgeon: Jordan, Peter M, MD;  Location: [facility] INVASIVE CV LAB;  Service: Cardiovascular;  Laterality: N/A;   MAZE N/A 07/17/2021   Procedure: MAZE;  Surgeon: Lucas Dorise POUR, MD;  Location: [facility] OR;  Service: Open Heart Surgery;  Laterality: N/A;   MITRAL VALVE REPLACEMENT N/A 07/17/2021   Procedure: MITRAL VALVE REPLACEMENT USING 27 MM MITRIS VALVE.  CRYO AND RADIOFREQUENCY MAZE PROCEDURE. LIGATION OF LEFT ATRIAL APPENDAGE.;  Surgeon: Lucas Dorise POUR, MD;  Location: [facility] OR;  Service: Open Heart Surgery;  Laterality: N/A;   TEE WITHOUT CARDIOVERSION N/A 05/27/2021   Procedure: TRANSESOPHAGEAL ECHOCARDIOGRAM (TEE);  Surgeon: Kate Lonni CROME, MD;  Location: [facility] ENDOSCOPY;  Service: Cardiovascular;  Laterality: N/A;   TEE WITHOUT CARDIOVERSION N/A 07/17/2021   Procedure: TRANSESOPHAGEAL ECHOCARDIOGRAM (TEE);  Surgeon: Lucas Dorise POUR, MD;  Location: [facility] OR;  Service: Open Heart Surgery;  Laterality: N/A;    Current Outpatient Medications  Medication Sig Dispense Refill   acetaminophen  (TYLENOL ) 500 MG tablet Take 500-1,000 mg by mouth as needed for mild pain (pain score 1-3) or moderate pain (pain score 4-6).     amLODipine  (NORVASC ) 5 MG tablet Take 1 tablet (5 mg total) by mouth daily. 90 tablet 3   aspirin  81 MG EC tablet Take 1 tablet (81 mg total) by mouth daily. Swallow whole. 150 tablet 2   atorvastatin  (LIPITOR) 40 MG tablet Take 1 tablet (40 mg total) by mouth daily. 90 tablet 3   metoprolol  tartrate (LOPRESSOR ) 50 MG tablet Take 1 tablet (50 mg total) by mouth 2 (two) times daily. 180 tablet 3   torsemide  (DEMADEX ) 20 MG tablet Take 2 tablets (40 mg total) by mouth daily. 90 tablet 3   No current facility-administered medications for this visit.    Allergies as of 01/20/2024 - Review Complete 11/03/2023   Allergen Reaction Noted   Porcine (pork) protein-containing drug products Other (See Comments) 04/19/2017    Family History  Problem Relation Age of Onset   Colon cancer Neg Hx    Colon polyps Neg Hx    Esophageal cancer Neg Hx    Rectal cancer Neg Hx    Stomach cancer Neg Hx     Social History   Socioeconomic History   Marital status: Married    Spouse name: Not on file   Number of children: 3   Years of education: Not on file   Highest education level: Not on file  Occupational History   Occupation: Homemaker  Tobacco Use   Smoking status: Never    Passive exposure: Never   Smokeless tobacco: Never  Vaping Use   Vaping status: Never Used  Substance and Sexual Activity   Alcohol use: No    Alcohol/week: 0.0 standard drinks of alcohol   Drug use: No   Sexual activity: Not on file  Other Topics Concern   Not on file  Social History Narrative   ** Merged History Encounter **       ** Merged History Encounter **  Social Drivers of Corporate Investment Banker Strain: Not on file  Food Insecurity: No Food Insecurity (08/09/2023)   Hunger Vital Sign    Worried About Running Out of Food in the Last Year: Never true    Ran Out of Food in the Last Year: Never true  Transportation Needs: No Transportation Needs (08/09/2023)   PRAPARE - Administrator, Civil Service (Medical): No    Lack of Transportation (Non-Medical): No  Physical Activity: Inactive (09/07/2023)   Exercise Vital Sign    Days of Exercise per Week: 0 days    Minutes of Exercise per Session: 0 min  Stress: No Stress Concern Present (08/09/2023)   [employer] of Occupational Health - Occupational Stress Questionnaire    Feeling of Stress : Not at all  Social Connections: Moderately Isolated (08/09/2023)   Social Connection and Isolation Panel    Frequency of Communication with Friends and Family: More than three times a week    Frequency of Social Gatherings with Friends and Family:  More than three times a week    Attends Religious Services: Never    Database Administrator or Organizations: No    Attends Banker Meetings: Never    Marital Status: Married  Catering Manager Violence: Not At Risk (08/09/2023)   Humiliation, Afraid, Rape, and Kick questionnaire    Fear of Current or Ex-Partner: No    Emotionally Abused: No    Physically Abused: No    Sexually Abused: No    Review of Systems:    Constitutional: No weight loss, fever, chills, weakness or fatigue HEENT: Eyes: No change in vision               Ears, Nose, Throat:  No change in hearing or congestion Skin: No rash or itching Cardiovascular: No chest pain, chest pressure or palpitations   Respiratory: No SOB or cough Gastrointestinal: See HPI and otherwise negative Genitourinary: No dysuria or change in urinary frequency Neurological: No headache, dizziness or syncope Musculoskeletal: No new muscle or joint pain Hematologic: No bleeding or bruising Psychiatric: No history of depression or anxiety    Physical Exam:  Vital signs: Ht 5' 4 (1.626 m)   Wt 200 lb (90.7 kg)   BMI 34.33 kg/m   Constitutional: NAD, alert and cooperative. Patient cannot physical lie flat Head:  Normocephalic and atraumatic. Eyes:   PEERL, EOMI. No icterus. Conjunctiva pink. Respiratory: Respirations even and unlabored. Lungs clear to auscultation bilaterally.   No wheezes, crackles, or rhonchi.  Cardiovascular:  Regular rate and rhythm. No peripheral edema, cyanosis or pallor.  Gastrointestinal:  Soft, nondistended, nontender. No rebound or guarding. Normal bowel sounds. No appreciable masses or hepatomegaly. Rectal:  Declines Msk:  Symmetrical without gross deformities. Without edema, no deformity or joint abnormality.  Neurologic:  Alert and  oriented x4;  grossly normal neurologically.  Skin:   Dry and intact without significant lesions or rashes. Psychiatric: Oriented to person, place and time.  Demonstrates good judgement and reason without abnormal affect or behaviors.   RELEVANT LABS AND IMAGING: CBC    Component Value Date/Time   WBC 7.0 08/16/2023 1154   WBC 7.8 03/14/2023 1306   RBC 4.16 08/16/2023 1154   RBC 4.31 03/14/2023 1306   HGB 11.8 08/16/2023 1154   HCT 36.0 08/16/2023 1154   PLT 205 08/16/2023 1154   MCV 87 08/16/2023 1154   MCH 28.4 08/16/2023 1154   MCH 26.8 11/19/2022 2105   MCHC  32.8 08/16/2023 1154   MCHC 33.0 03/14/2023 1306   RDW 13.0 08/16/2023 1154   LYMPHSABS 2.5 08/16/2023 1154   MONOABS 0.7 03/14/2023 1306   EOSABS 0.2 08/16/2023 1154   BASOSABS 0.1 08/16/2023 1154    CMP     Component Value Date/Time   NA 141 08/16/2023 1154   K 4.7 08/16/2023 1154   CL 105 08/16/2023 1154   CO2 21 08/16/2023 1154   GLUCOSE 100 (H) 08/16/2023 1154   GLUCOSE 111 (H) 03/14/2023 1306   BUN 12 08/16/2023 1154   CREATININE 0.67 08/16/2023 1154   CREATININE 0.60 11/02/2013 1638   CALCIUM  9.4 08/16/2023 1154   PROT 7.7 04/29/2023 1014   ALBUMIN  4.2 04/29/2023 1014   AST 22 04/29/2023 1014   ALT 16 04/29/2023 1014   ALKPHOS 127 (H) 04/29/2023 1014   BILITOT 0.4 04/29/2023 1014   GFRNONAA >60 11/19/2022 2105   GFRNONAA >89 11/02/2013 1638   GFRAA 115 04/15/2017 0000   GFRAA >89 11/02/2013 1638     Assessment/Plan:   58 year old [place] female with a history of H. pylori gastritis confirmed by EGD since 2017 initially prescribed quadruple therapy, patient did not take.  She underwent H. pylori stool antigen testing per her PCP 08/2022 which was positive, prescribed Cipro /metronidazole /tetracycline /bismuth  and PPI which was discontinued secondary to chest pain thought to be as an adverse response to this regimen.  She was seen by her PCP for follow-up 01/03/2023 who prescribed Pylera  as an alternative H. pylori treatment which the patient has not taken. Completed Pylera  with Negative H pylori breath test 07/2023 Persistent GERD symptoms. Very high risk  for EGD. Cannot even be supine for exam today due to MSK chest pain from procedure in 2023. Hypertension 160/98 as well. -- recheck H pylori diatherix -- after stool study, restart omeprazole  40mg   Dysphagia Dysphagia to liquids and solids (more of an issue with liquids). High risk for procedure -- MBS  Colon cancer screening.  Patient denies ever having a screening colonoscopy. Unable to be supine during my physical exam today due to severe chest pain that she states she gets since procedure below. Causes difficulty sleeping. Suspect she will not be able to undergo procedure since she cannot remain supine. Continued uncontrolled BP (160/90 today in clinic and at home) -- cologuard (discussed we do have to proceed with colonoscopy is this is positive)  History of paroxysmal atrial fibrillation s/p Maze 06/2021 on ASA and severe mitral stenosis s/p MVR 06/2021.  Coronary angiography in 2019 revealed normal coronary arteries.  ECHO 11/2022 with LVEF 55 - 60%. Recently seen 10/2023 by cardiology  Hypertension Persistent hypertension with BP 160/90 even at home. Recent increase in amlodipine  without effect -- continue to follow up with cardiology    History of CVA 2014 or 2015   DM type II    Nestor Mollie RIGGERS [facility] 01/20/2024, 10:31 AM  Cc: Harrie Bruckner, DO

## 2024-01-23 ENCOUNTER — Other Ambulatory Visit

## 2024-01-23 DIAGNOSIS — A048 Other specified bacterial intestinal infections: Secondary | ICD-10-CM

## 2024-01-24 ENCOUNTER — Ambulatory Visit: Payer: Self-pay | Admitting: Gastroenterology

## 2024-01-24 LAB — H. PYLORI ANTIGEN, STOOL: H pylori Ag, Stl: NEGATIVE

## 2024-01-27 ENCOUNTER — Telehealth (HOSPITAL_COMMUNITY): Payer: Self-pay | Admitting: Gastroenterology

## 2024-01-27 NOTE — Telephone Encounter (Signed)
 Second attempt to reach pt using arabic interpreter to schedule swallow study. Left detailed VM and advised we'd make a final attempt to connect next week. (AHARRIS)

## 2024-01-30 ENCOUNTER — Other Ambulatory Visit (HOSPITAL_BASED_OUTPATIENT_CLINIC_OR_DEPARTMENT_OTHER): Payer: Self-pay

## 2024-02-02 ENCOUNTER — Other Ambulatory Visit (HOSPITAL_BASED_OUTPATIENT_CLINIC_OR_DEPARTMENT_OTHER): Payer: Self-pay

## 2024-03-30 ENCOUNTER — Other Ambulatory Visit (HOSPITAL_BASED_OUTPATIENT_CLINIC_OR_DEPARTMENT_OTHER): Payer: Self-pay

## 2024-04-11 ENCOUNTER — Ambulatory Visit: Admitting: Gastroenterology
